# Patient Record
Sex: Male | Born: 1935 | Race: Black or African American | Hispanic: No | Marital: Single | Smoking: Never smoker
Health system: Southern US, Community
[De-identification: ages and names within clinical notes are randomized; demographics above are authoritative.]

## PROBLEM LIST (undated history)

## (undated) DIAGNOSIS — I4891 Unspecified atrial fibrillation: Secondary | ICD-10-CM

## (undated) DIAGNOSIS — F0390 Unspecified dementia without behavioral disturbance: Secondary | ICD-10-CM

## (undated) DIAGNOSIS — R001 Bradycardia, unspecified: Secondary | ICD-10-CM

## (undated) DIAGNOSIS — H409 Unspecified glaucoma: Secondary | ICD-10-CM

## (undated) DIAGNOSIS — N390 Urinary tract infection, site not specified: Secondary | ICD-10-CM

## (undated) DIAGNOSIS — B019 Varicella without complication: Secondary | ICD-10-CM

## (undated) DIAGNOSIS — I951 Orthostatic hypotension: Secondary | ICD-10-CM

## (undated) DIAGNOSIS — H5440 Blindness, one eye, unspecified eye: Secondary | ICD-10-CM

## (undated) DIAGNOSIS — I10 Essential (primary) hypertension: Secondary | ICD-10-CM

## (undated) DIAGNOSIS — I350 Nonrheumatic aortic (valve) stenosis: Secondary | ICD-10-CM

## (undated) DIAGNOSIS — I639 Cerebral infarction, unspecified: Secondary | ICD-10-CM

## (undated) DIAGNOSIS — R55 Syncope and collapse: Secondary | ICD-10-CM

## (undated) DIAGNOSIS — J189 Pneumonia, unspecified organism: Secondary | ICD-10-CM

## (undated) HISTORY — DX: Unspecified glaucoma: H40.9

## (undated) HISTORY — PX: EYE SURGERY: SHX253

## (undated) HISTORY — DX: Urinary tract infection, site not specified: N39.0

## (undated) HISTORY — DX: Orthostatic hypotension: I95.1

## (undated) HISTORY — DX: Varicella without complication: B01.9

---

## 2017-12-04 ENCOUNTER — Other Ambulatory Visit: Payer: Self-pay

## 2017-12-04 ENCOUNTER — Encounter (HOSPITAL_COMMUNITY): Payer: Self-pay | Admitting: Emergency Medicine

## 2017-12-04 ENCOUNTER — Emergency Department (HOSPITAL_COMMUNITY): Payer: Medicare Other

## 2017-12-04 ENCOUNTER — Inpatient Hospital Stay (HOSPITAL_COMMUNITY)
Admission: EM | Admit: 2017-12-04 | Discharge: 2017-12-09 | DRG: 312 | Disposition: A | Discharge status: Skilled Nursing Facility | Payer: Medicare Other | Attending: Family Medicine | Admitting: Family Medicine

## 2017-12-04 DIAGNOSIS — H5461 Unqualified visual loss, right eye, normal vision left eye: Secondary | ICD-10-CM | POA: Diagnosis present

## 2017-12-04 DIAGNOSIS — I959 Hypotension, unspecified: Secondary | ICD-10-CM | POA: Diagnosis present

## 2017-12-04 DIAGNOSIS — Z8249 Family history of ischemic heart disease and other diseases of the circulatory system: Secondary | ICD-10-CM

## 2017-12-04 DIAGNOSIS — J690 Pneumonitis due to inhalation of food and vomit: Secondary | ICD-10-CM | POA: Diagnosis present

## 2017-12-04 DIAGNOSIS — I495 Sick sinus syndrome: Secondary | ICD-10-CM | POA: Diagnosis present

## 2017-12-04 DIAGNOSIS — Z23 Encounter for immunization: Secondary | ICD-10-CM

## 2017-12-04 DIAGNOSIS — D62 Acute posthemorrhagic anemia: Secondary | ICD-10-CM

## 2017-12-04 DIAGNOSIS — R55 Syncope and collapse: Secondary | ICD-10-CM | POA: Diagnosis present

## 2017-12-04 DIAGNOSIS — J181 Lobar pneumonia, unspecified organism: Secondary | ICD-10-CM

## 2017-12-04 DIAGNOSIS — F0390 Unspecified dementia without behavioral disturbance: Secondary | ICD-10-CM | POA: Diagnosis present

## 2017-12-04 DIAGNOSIS — H3411 Central retinal artery occlusion, right eye: Secondary | ICD-10-CM | POA: Diagnosis present

## 2017-12-04 DIAGNOSIS — F329 Major depressive disorder, single episode, unspecified: Secondary | ICD-10-CM | POA: Diagnosis present

## 2017-12-04 DIAGNOSIS — H349 Unspecified retinal vascular occlusion: Secondary | ICD-10-CM

## 2017-12-04 DIAGNOSIS — I4891 Unspecified atrial fibrillation: Secondary | ICD-10-CM | POA: Diagnosis present

## 2017-12-04 DIAGNOSIS — G8194 Hemiplegia, unspecified affecting left nondominant side: Secondary | ICD-10-CM | POA: Diagnosis present

## 2017-12-04 DIAGNOSIS — E785 Hyperlipidemia, unspecified: Secondary | ICD-10-CM | POA: Diagnosis present

## 2017-12-04 DIAGNOSIS — F0391 Unspecified dementia with behavioral disturbance: Secondary | ICD-10-CM | POA: Diagnosis present

## 2017-12-04 DIAGNOSIS — J189 Pneumonia, unspecified organism: Secondary | ICD-10-CM | POA: Diagnosis present

## 2017-12-04 DIAGNOSIS — Z7902 Long term (current) use of antithrombotics/antiplatelets: Secondary | ICD-10-CM

## 2017-12-04 DIAGNOSIS — F05 Delirium due to known physiological condition: Secondary | ICD-10-CM | POA: Diagnosis present

## 2017-12-04 DIAGNOSIS — I10 Essential (primary) hypertension: Secondary | ICD-10-CM | POA: Diagnosis present

## 2017-12-04 DIAGNOSIS — Z79899 Other long term (current) drug therapy: Secondary | ICD-10-CM

## 2017-12-04 DIAGNOSIS — R1310 Dysphagia, unspecified: Secondary | ICD-10-CM | POA: Diagnosis present

## 2017-12-04 DIAGNOSIS — H409 Unspecified glaucoma: Secondary | ICD-10-CM | POA: Diagnosis present

## 2017-12-04 DIAGNOSIS — T17908A Unspecified foreign body in respiratory tract, part unspecified causing other injury, initial encounter: Secondary | ICD-10-CM

## 2017-12-04 DIAGNOSIS — E876 Hypokalemia: Secondary | ICD-10-CM

## 2017-12-04 DIAGNOSIS — I6930 Unspecified sequelae of cerebral infarction: Secondary | ICD-10-CM

## 2017-12-04 DIAGNOSIS — E860 Dehydration: Secondary | ICD-10-CM | POA: Diagnosis present

## 2017-12-04 DIAGNOSIS — I350 Nonrheumatic aortic (valve) stenosis: Secondary | ICD-10-CM

## 2017-12-04 HISTORY — DX: Essential (primary) hypertension: I10

## 2017-12-04 HISTORY — DX: Cerebral infarction, unspecified: I63.9

## 2017-12-04 LAB — CBG MONITORING, ED: Glucose-Capillary: 95 mg/dL (ref 70–99)

## 2017-12-04 LAB — CBC
HEMATOCRIT: 41.5 % (ref 39.0–52.0)
HEMOGLOBIN: 13.1 g/dL (ref 13.0–17.0)
MCH: 28.9 pg (ref 26.0–34.0)
MCHC: 31.6 g/dL (ref 30.0–36.0)
MCV: 91.4 fL (ref 78.0–100.0)
Platelets: 196 10*3/uL (ref 150–400)
RBC: 4.54 MIL/uL (ref 4.22–5.81)
RDW: 14 % (ref 11.5–15.5)
WBC: 8.9 10*3/uL (ref 4.0–10.5)

## 2017-12-04 LAB — I-STAT TROPONIN, ED: TROPONIN I, POC: 0 ng/mL (ref 0.00–0.08)

## 2017-12-04 MED ORDER — SODIUM CHLORIDE 0.9 % IV BOLUS
250.0000 mL | Freq: Once | INTRAVENOUS | Status: AC
  Administered 2017-12-04: 250 mL via INTRAVENOUS

## 2017-12-04 NOTE — ED Provider Notes (Signed)
[facility] EMERGENCY DEPARTMENT Provider Note   CSN: [id number] Arrival date & time: 12/04/17  2126     History   Chief Complaint Chief Complaint  Patient presents with  . Loss of Consciousness    HPI Dan Stone is a 84 y.o. male.  The history is provided by the patient and medical records. No language interpreter was used.  Loss of Consciousness   This is a new problem. The current episode started less than 1 hour ago. The problem occurs constantly. The problem has been resolved. He lost consciousness for a period of 1 to 5 minutes. The problem is associated with normal activity. Associated symptoms include light-headedness, nausea and vomiting. Pertinent negatives include back pain, chest pain, confusion, congestion, diaphoresis, dizziness, fever, headaches, palpitations, seizures, slurred speech, vertigo and weakness. He has tried nothing for the symptoms. The treatment provided no relief. His past medical history is significant for CVA.    Past Medical History:  Diagnosis Date  . Stroke [facility])    5 years ago    There are no active problems to display for this patient.   History reviewed. No pertinent surgical history.      Home Medications    Prior to Admission medications   Not on File    Family History No family history on file.  Social History Social History   Tobacco Use  . Smoking status: Not on file  Substance Use Topics  . Alcohol use: Not on file  . Drug use: Not on file     Allergies   Patient has no allergy information on record.   Review of Systems Review of Systems  Constitutional: Positive for fatigue. Negative for chills, diaphoresis and fever.  HENT: Negative for congestion and rhinorrhea.   Eyes: Negative for visual disturbance.  Respiratory: Negative for cough, choking, chest tightness, shortness of breath and wheezing.   Cardiovascular: Positive for syncope. Negative for chest pain and palpitations.    Gastrointestinal: Positive for nausea and vomiting. Negative for constipation and diarrhea.  Genitourinary: Positive for frequency. Negative for dysuria and flank pain.  Musculoskeletal: Negative for back pain, neck pain and neck stiffness.  Skin: Negative for rash and wound.  Neurological: Positive for light-headedness. Negative for dizziness, vertigo, seizures, speech difficulty, weakness and headaches.  Psychiatric/Behavioral: Negative for agitation and confusion.     Physical Exam Updated Vital Signs BP 107/64   Pulse 87   Temp 98.3 F (36.8 C) (Oral)   Resp 15   Ht 5\' 9"  (1.753 m)   Wt 113.4 kg (250 lb)   SpO2 97%   BMI 36.92 kg/m   Physical Exam  Constitutional: He is oriented to person, place, and time. He appears well-developed and well-nourished. No distress.  HENT:  Head: Normocephalic and atraumatic.  Mouth/Throat: Oropharynx is clear and moist. No oropharyngeal exudate.  Eyes: Conjunctivae and EOM are normal. Right pupil is reactive. Left pupil is reactive.  Right pupil slightly more large than left.  Neck: Neck supple.  Cardiovascular: An irregular rhythm present. Bradycardia present.  No murmur heard. Pulmonary/Chest: Effort normal. No respiratory distress. He has no wheezes. He has rhonchi. He has no rales. He exhibits no tenderness.  Abdominal: Soft. There is no tenderness.  Musculoskeletal: He exhibits no edema or tenderness.  Lymphadenopathy:    He has no cervical adenopathy.  Neurological: He is alert and oriented to person, place, and time. A cranial nerve deficit is present. No sensory deficit. He exhibits normal muscle tone.  Skin: Skin is warm and dry. Capillary refill takes less than 2 seconds. He is not diaphoretic. No erythema. No pallor.  Psychiatric: He has a normal mood and affect.  Nursing note and vitals reviewed.    ED Treatments / Results  Labs (all labs ordered are listed, but only abnormal results are displayed) Labs Reviewed  BASIC  METABOLIC PANEL - Abnormal; Notable for the following components:      Result Value   Glucose, Bld 111 (*)    BUN 7 (*)    Calcium 8.4 (*)    All other components within normal limits  BRAIN NATRIURETIC PEPTIDE - Abnormal; Notable for the following components:   B Natriuretic Peptide 172.2 (*)    All other components within normal limits  HEPATIC FUNCTION PANEL - Abnormal; Notable for the following components:   Total Protein 6.0 (*)    Albumin 2.9 (*)    Total Bilirubin 1.4 (*)    Bilirubin, Direct 0.3 (*)    Indirect Bilirubin 1.1 (*)    All other components within normal limits  URINE CULTURE  CBC  MAGNESIUM  URINALYSIS, ROUTINE W REFLEX MICROSCOPIC  CBG MONITORING, ED  I-STAT TROPONIN, ED  I-STAT TROPONIN, ED    EKG EKG Interpretation  Date/Time:  Wednesday December 04 2017 21:34:52 EDT Ventricular Rate:  80 PR Interval:    QRS Duration: 76 QT Interval:  479 QTC Calculation: 553 R Axis:   87 Text Interpretation:  Sinus or ectopic atrial rhythm Atrial premature complex Anterior infarct, old Nonspecific T abnormalities, lateral leads Prolonged QT interval No Prior ECG for comparison.  Sinus bradycardia.  No STEMI Confirmed by Theda Belfast ([id number]) on 12/04/2017 10:14:46 PM   Radiology Dg Chest 2 View  Result Date: 12/04/2017 CLINICAL DATA:  Vomiting.  Syncopal episode.  Crackles on the lungs. EXAM: CHEST - 2 VIEW COMPARISON:  None. FINDINGS: There is a focal area of airspace infiltration in the right lung base. This could indicate pneumonia or aspiration. Left lung is clear. No blunting of costophrenic angles. No pneumothorax. Heart size and pulmonary vascularity are normal. Calcification of the aorta. Mediastinal contours appear intact. Degenerative changes in the spine and shoulders. IMPRESSION: Focal infiltration in the right lung base may indicate pneumonia or aspiration. Electronically Signed   By: Burman Nieves M.D.   On: 12/04/2017 23:10   Ct Head Wo  Contrast  Result Date: 12/04/2017 CLINICAL DATA:  84 year old male with syncope. EXAM: CT HEAD WITHOUT CONTRAST TECHNIQUE: Contiguous axial images were obtained from the base of the skull through the vertex without intravenous contrast. COMPARISON:  None. FINDINGS: Brain: No evidence of acute infarction, hemorrhage, hydrocephalus, extra-axial collection or mass lesion/mass effect. Atrophy and chronic small-vessel white matter ischemic changes are noted. Vascular: Intracranial atherosclerotic calcifications noted. Skull: Normal. Negative for fracture or focal lesion. Sinuses/Orbits: No acute finding. Other: None. IMPRESSION: 1. No evidence of acute intracranial abnormality. 2. Atrophy and chronic small-vessel white matter ischemic changes. Electronically Signed   By: Harmon Pier M.D.   On: 12/04/2017 23:56    Procedures Procedures (including critical care time)  Medications Ordered in ED Medications  sodium chloride 0.9 % bolus 250 mL (0 mLs Intravenous Stopped 12/05/17 0102)     Initial Impression / Assessment and Plan / ED Course  I have reviewed the triage vital signs and the nursing notes.  Pertinent labs & imaging results that were available during my care of the patient were reviewed by me and considered in my medical decision making (  see chart for details).     Fayrene FearingJames [name] is a 84 y.o. male with a past medical history significant for prior stroke, Alzheimer's, who presents with a syncopal episode.  Patient is coming by family reports that he was at home watching TV when the patient had a witnessed syncopal episode.  Family reports that he syncopized for approximately 5 minutes and vomited upon waking up.  Patient denied any chest pain, palpitations or shortness of breath.  Patient was found to be hypotensive with EMS and had a blood pressure of 78/48.  Patient's blood pressure improved after transfer to the emergency department.  Patient reports that he has been feeling very tired over the  last several days.  He has had some urinary frequency but otherwise has not had any nausea, vomiting, conservation, or diarrhea.  He has had no other chest pain or palpitations.  He denies any recent falls or injuries.   On exam, patient's blood pressure was normotensive however when I was examining the patient, his heart rate was seen on telemetry to drop into the low 40s.  EKG showed an irregular sinus bradycardia.  Patient's lungs had some crackles in the bases.  Patient had a mild systolic murmur.  Patient had no significant edema in the legs and abdomen was nontender.  Patient had no focal neurologic deficits on my exam aside from slightly irregular pupil on the right.  Patient does not remember having asymmetric pupils in the past.  Patient was somewhat sleepy.   I am concerned about the patient's bradycardia on exam and his hypotension with syncope.    ECG showed prolonged QTC as well as a sinus arrythmia.   Patient will screen laboratory testing as well as head CT with the irregular pupil.  He will also have chest x-ray due to the coarse breath sounds.    Chest x-ray shows evidence of pneumonia versus aspiration.  Patient was reassessed and he continued to deny any recent fevers, cough, or shortness of breath.  With the 5-minute syncopal episode and associated emesis I am concerned he may have aspirated.  Due to the patient's aspiration, continued intermittent bradycardia, and the hypotension, do not feel patient is safe for discharge home.  Patient will be admitted for further evaluation and management.   Final Clinical Impressions(s) / ED Diagnoses   Final diagnoses:  Syncope and collapse  Aspiration into respiratory tract, initial encounter    ED Discharge Orders    None      Clinical Impression: 1. Syncope and collapse   2. Aspiration into respiratory tract, initial encounter     Disposition: Admit  This note was prepared with assistance of Dragon voice recognition  software. Occasional wrong-word or sound-a-like substitutions may have occurred due to the inherent limitations of voice recognition software.       [place] Kappes, Canary Brimhristopher J, MD 12/05/17 [phone]

## 2017-12-04 NOTE — ED Triage Notes (Signed)
Patient arrived via ems from home. per ems, patient was taking a nap and when family woke him up he vomited. Fire dept got a palpated bp of 100 lying then sat him up and he had a syncopal episode and a BP of 78/48. bp of 122/70 once he was lying flat and regained consciousness. Patient aox4 at this time. Hx of afib, alzheimer's.

## 2017-12-04 NOTE — ED Notes (Signed)
IV attempted by two different RNs, unsuccessful

## 2017-12-05 ENCOUNTER — Observation Stay (HOSPITAL_BASED_OUTPATIENT_CLINIC_OR_DEPARTMENT_OTHER): Payer: Medicare Other

## 2017-12-05 ENCOUNTER — Observation Stay (HOSPITAL_COMMUNITY): Payer: Medicare Other

## 2017-12-05 ENCOUNTER — Encounter (HOSPITAL_COMMUNITY): Payer: Self-pay | Admitting: Internal Medicine

## 2017-12-05 DIAGNOSIS — H5461 Unqualified visual loss, right eye, normal vision left eye: Secondary | ICD-10-CM | POA: Diagnosis present

## 2017-12-05 DIAGNOSIS — E876 Hypokalemia: Secondary | ICD-10-CM | POA: Diagnosis not present

## 2017-12-05 DIAGNOSIS — I4891 Unspecified atrial fibrillation: Secondary | ICD-10-CM | POA: Diagnosis present

## 2017-12-05 DIAGNOSIS — H409 Unspecified glaucoma: Secondary | ICD-10-CM | POA: Diagnosis present

## 2017-12-05 DIAGNOSIS — I361 Nonrheumatic tricuspid (valve) insufficiency: Secondary | ICD-10-CM

## 2017-12-05 DIAGNOSIS — T17908A Unspecified foreign body in respiratory tract, part unspecified causing other injury, initial encounter: Secondary | ICD-10-CM | POA: Diagnosis not present

## 2017-12-05 DIAGNOSIS — J181 Lobar pneumonia, unspecified organism: Secondary | ICD-10-CM

## 2017-12-05 DIAGNOSIS — Z79899 Other long term (current) drug therapy: Secondary | ICD-10-CM | POA: Diagnosis not present

## 2017-12-05 DIAGNOSIS — Z23 Encounter for immunization: Secondary | ICD-10-CM | POA: Diagnosis present

## 2017-12-05 DIAGNOSIS — I10 Essential (primary) hypertension: Secondary | ICD-10-CM | POA: Diagnosis present

## 2017-12-05 DIAGNOSIS — I639 Cerebral infarction, unspecified: Secondary | ICD-10-CM | POA: Diagnosis not present

## 2017-12-05 DIAGNOSIS — Z8249 Family history of ischemic heart disease and other diseases of the circulatory system: Secondary | ICD-10-CM | POA: Diagnosis not present

## 2017-12-05 DIAGNOSIS — F0390 Unspecified dementia without behavioral disturbance: Secondary | ICD-10-CM | POA: Diagnosis present

## 2017-12-05 DIAGNOSIS — J690 Pneumonitis due to inhalation of food and vomit: Secondary | ICD-10-CM | POA: Diagnosis present

## 2017-12-05 DIAGNOSIS — E785 Hyperlipidemia, unspecified: Secondary | ICD-10-CM | POA: Diagnosis present

## 2017-12-05 DIAGNOSIS — Z7902 Long term (current) use of antithrombotics/antiplatelets: Secondary | ICD-10-CM | POA: Diagnosis not present

## 2017-12-05 DIAGNOSIS — J189 Pneumonia, unspecified organism: Secondary | ICD-10-CM | POA: Diagnosis present

## 2017-12-05 DIAGNOSIS — R55 Syncope and collapse: Secondary | ICD-10-CM | POA: Diagnosis present

## 2017-12-05 DIAGNOSIS — F0391 Unspecified dementia with behavioral disturbance: Secondary | ICD-10-CM | POA: Diagnosis present

## 2017-12-05 DIAGNOSIS — I6930 Unspecified sequelae of cerebral infarction: Secondary | ICD-10-CM | POA: Diagnosis not present

## 2017-12-05 DIAGNOSIS — I495 Sick sinus syndrome: Secondary | ICD-10-CM | POA: Diagnosis present

## 2017-12-05 DIAGNOSIS — I498 Other specified cardiac arrhythmias: Secondary | ICD-10-CM | POA: Diagnosis not present

## 2017-12-05 DIAGNOSIS — D62 Acute posthemorrhagic anemia: Secondary | ICD-10-CM | POA: Diagnosis not present

## 2017-12-05 DIAGNOSIS — G8194 Hemiplegia, unspecified affecting left nondominant side: Secondary | ICD-10-CM | POA: Diagnosis present

## 2017-12-05 DIAGNOSIS — F329 Major depressive disorder, single episode, unspecified: Secondary | ICD-10-CM | POA: Diagnosis present

## 2017-12-05 DIAGNOSIS — I959 Hypotension, unspecified: Secondary | ICD-10-CM | POA: Diagnosis present

## 2017-12-05 DIAGNOSIS — H349 Unspecified retinal vascular occlusion: Secondary | ICD-10-CM | POA: Diagnosis not present

## 2017-12-05 DIAGNOSIS — H3411 Central retinal artery occlusion, right eye: Secondary | ICD-10-CM | POA: Diagnosis present

## 2017-12-05 DIAGNOSIS — E860 Dehydration: Secondary | ICD-10-CM | POA: Diagnosis present

## 2017-12-05 DIAGNOSIS — I350 Nonrheumatic aortic (valve) stenosis: Secondary | ICD-10-CM | POA: Diagnosis present

## 2017-12-05 DIAGNOSIS — F05 Delirium due to known physiological condition: Secondary | ICD-10-CM | POA: Diagnosis present

## 2017-12-05 DIAGNOSIS — R1310 Dysphagia, unspecified: Secondary | ICD-10-CM | POA: Diagnosis present

## 2017-12-05 LAB — TSH: TSH: 1.712 u[IU]/mL (ref 0.350–4.500)

## 2017-12-05 LAB — CBC WITH DIFFERENTIAL/PLATELET
Abs Immature Granulocytes: 0 10*3/uL (ref 0.0–0.1)
Basophils Absolute: 0 10*3/uL (ref 0.0–0.1)
Basophils Relative: 0 %
EOS ABS: 0 10*3/uL (ref 0.0–0.7)
EOS PCT: 0 %
HEMATOCRIT: 39 % (ref 39.0–52.0)
HEMOGLOBIN: 12.4 g/dL — AB (ref 13.0–17.0)
IMMATURE GRANULOCYTES: 0 %
LYMPHS ABS: 0.9 10*3/uL (ref 0.7–4.0)
LYMPHS PCT: 12 %
MCH: 28.6 pg (ref 26.0–34.0)
MCHC: 31.8 g/dL (ref 30.0–36.0)
MCV: 90.1 fL (ref 78.0–100.0)
MONOS PCT: 6 %
Monocytes Absolute: 0.5 10*3/uL (ref 0.1–1.0)
NEUTROS PCT: 82 %
Neutro Abs: 6.5 10*3/uL (ref 1.7–7.7)
Platelets: 177 10*3/uL (ref 150–400)
RBC: 4.33 MIL/uL (ref 4.22–5.81)
RDW: 14.1 % (ref 11.5–15.5)
WBC: 7.9 10*3/uL (ref 4.0–10.5)

## 2017-12-05 LAB — URINALYSIS, ROUTINE W REFLEX MICROSCOPIC
BILIRUBIN URINE: NEGATIVE
Glucose, UA: NEGATIVE mg/dL
HGB URINE DIPSTICK: NEGATIVE
Ketones, ur: NEGATIVE mg/dL
Leukocytes, UA: NEGATIVE
NITRITE: NEGATIVE
PH: 6 (ref 5.0–8.0)
Protein, ur: NEGATIVE mg/dL
SPECIFIC GRAVITY, URINE: 1.018 (ref 1.005–1.030)

## 2017-12-05 LAB — HEPATIC FUNCTION PANEL
ALT: 13 U/L (ref 0–44)
AST: 19 U/L (ref 15–41)
Albumin: 2.9 g/dL — ABNORMAL LOW (ref 3.5–5.0)
Alkaline Phosphatase: 78 U/L (ref 38–126)
BILIRUBIN DIRECT: 0.3 mg/dL — AB (ref 0.0–0.2)
BILIRUBIN INDIRECT: 1.1 mg/dL — AB (ref 0.3–0.9)
Total Bilirubin: 1.4 mg/dL — ABNORMAL HIGH (ref 0.3–1.2)
Total Protein: 6 g/dL — ABNORMAL LOW (ref 6.5–8.1)

## 2017-12-05 LAB — I-STAT TROPONIN, ED: Troponin i, poc: 0.01 ng/mL (ref 0.00–0.08)

## 2017-12-05 LAB — TROPONIN I: Troponin I: <0.03 ng/mL (ref <0.03)

## 2017-12-05 LAB — COMPREHENSIVE METABOLIC PANEL
ALT: 13 U/L (ref 0–44)
AST: 20 U/L (ref 15–41)
Albumin: 2.8 g/dL — ABNORMAL LOW (ref 3.5–5.0)
Alkaline Phosphatase: 75 U/L (ref 38–126)
Anion gap: 10 (ref 5–15)
BUN: 9 mg/dL (ref 8–23)
CO2: 26 mmol/L (ref 22–32)
CREATININE: 1.06 mg/dL (ref 0.61–1.24)
Calcium: 8.5 mg/dL — ABNORMAL LOW (ref 8.9–10.3)
Chloride: 106 mmol/L (ref 98–111)
GFR calc Af Amer: >60 mL/min (ref >60)
GFR calc non Af Amer: >60 mL/min (ref >60)
Glucose, Bld: 92 mg/dL (ref 70–99)
POTASSIUM: 3.8 mmol/L (ref 3.5–5.1)
SODIUM: 142 mmol/L (ref 135–145)
Total Bilirubin: 1.6 mg/dL — ABNORMAL HIGH (ref 0.3–1.2)
Total Protein: 5.9 g/dL — ABNORMAL LOW (ref 6.5–8.1)

## 2017-12-05 LAB — BASIC METABOLIC PANEL
Anion gap: 7 (ref 5–15)
BUN: 7 mg/dL — ABNORMAL LOW (ref 8–23)
CO2: 25 mmol/L (ref 22–32)
CREATININE: 1.04 mg/dL (ref 0.61–1.24)
Calcium: 8.4 mg/dL — ABNORMAL LOW (ref 8.9–10.3)
Chloride: 107 mmol/L (ref 98–111)
GFR calc non Af Amer: >60 mL/min (ref >60)
Glucose, Bld: 111 mg/dL — ABNORMAL HIGH (ref 70–99)
Potassium: 3.8 mmol/L (ref 3.5–5.1)
SODIUM: 139 mmol/L (ref 135–145)

## 2017-12-05 LAB — ECHOCARDIOGRAM COMPLETE
HEIGHTINCHES: 69 in
WEIGHTICAEL: 4000 [oz_av]

## 2017-12-05 LAB — STREP PNEUMONIAE URINARY ANTIGEN: STREP PNEUMO URINARY ANTIGEN: NEGATIVE

## 2017-12-05 LAB — MAGNESIUM: Magnesium: 2.1 mg/dL (ref 1.7–2.4)

## 2017-12-05 LAB — BRAIN NATRIURETIC PEPTIDE: B NATRIURETIC PEPTIDE 5: 172.2 pg/mL — AB (ref 0.0–100.0)

## 2017-12-05 MED ORDER — SERTRALINE HCL 25 MG PO TABS
25.0000 mg | ORAL_TABLET | Freq: Every day | ORAL | Status: DC
  Administered 2017-12-05 – 2017-12-07 (×3): 25 mg via ORAL
  Filled 2017-12-05 (×4): qty 1

## 2017-12-05 MED ORDER — ACETAMINOPHEN 650 MG RE SUPP: 650.0000 mg | Freq: Four times a day (QID) | RECTAL | Status: DC | PRN

## 2017-12-05 MED ORDER — ACETAMINOPHEN 325 MG PO TABS: 650.0000 mg | ORAL_TABLET | Freq: Four times a day (QID) | ORAL | Status: DC | PRN

## 2017-12-05 MED ORDER — PNEUMOCOCCAL VAC POLYVALENT 25 MCG/0.5ML IJ INJ
0.5000 mL | INJECTION | INTRAMUSCULAR | Status: AC
  Administered 2017-12-06: 0.5 mL via INTRAMUSCULAR
  Filled 2017-12-05: qty 0.5

## 2017-12-05 MED ORDER — GABAPENTIN 100 MG PO CAPS
100.0000 mg | ORAL_CAPSULE | Freq: Every day | ORAL | Status: DC
  Administered 2017-12-05 – 2017-12-09 (×5): 100 mg via ORAL
  Filled 2017-12-05 (×5): qty 1

## 2017-12-05 MED ORDER — SODIUM CHLORIDE 0.9 % IV SOLN
1.5000 g | Freq: Four times a day (QID) | INTRAVENOUS | Status: DC
  Administered 2017-12-05 – 2017-12-09 (×16): 1.5 g via INTRAVENOUS
  Filled 2017-12-05 (×19): qty 1.5

## 2017-12-05 MED ORDER — TRAZODONE HCL 50 MG PO TABS
50.0000 mg | ORAL_TABLET | Freq: Every day | ORAL | Status: DC
  Administered 2017-12-05 – 2017-12-07 (×3): 50 mg via ORAL
  Filled 2017-12-05 (×4): qty 1

## 2017-12-05 MED ORDER — CLOPIDOGREL BISULFATE 75 MG PO TABS
75.0000 mg | ORAL_TABLET | Freq: Every day | ORAL | Status: DC
  Administered 2017-12-05 – 2017-12-09 (×5): 75 mg via ORAL
  Filled 2017-12-05 (×5): qty 1

## 2017-12-05 MED ORDER — ENOXAPARIN SODIUM 40 MG/0.4ML ~~LOC~~ SOLN
40.0000 mg | SUBCUTANEOUS | Status: DC
  Administered 2017-12-05: 40 mg via SUBCUTANEOUS
  Filled 2017-12-05: qty 0.4

## 2017-12-05 MED ORDER — LEVOFLOXACIN IN D5W 750 MG/150ML IV SOLN
750.0000 mg | INTRAVENOUS | Status: DC
  Administered 2017-12-05: 750 mg via INTRAVENOUS
  Filled 2017-12-05: qty 150

## 2017-12-05 MED ORDER — PERFLUTREN LIPID MICROSPHERE
1.0000 mL | INTRAVENOUS | Status: AC | PRN
  Administered 2017-12-05: 2 mL via INTRAVENOUS
  Filled 2017-12-05: qty 10

## 2017-12-05 MED ORDER — AMLODIPINE BESYLATE 5 MG PO TABS: 5.0000 mg | ORAL_TABLET | Freq: Every day | ORAL | Status: DC

## 2017-12-05 MED ORDER — HYDRALAZINE HCL 20 MG/ML IJ SOLN: 5.0000 mg | INTRAMUSCULAR | Status: DC | PRN

## 2017-12-05 MED ORDER — ONDANSETRON HCL 4 MG/2ML IJ SOLN: 4.0000 mg | Freq: Four times a day (QID) | INTRAMUSCULAR | Status: DC | PRN

## 2017-12-05 MED ORDER — RISPERIDONE 1 MG PO TABS
1.5000 mg | ORAL_TABLET | Freq: Two times a day (BID) | ORAL | Status: DC
  Administered 2017-12-05 – 2017-12-07 (×5): 1.5 mg via ORAL
  Filled 2017-12-05 (×5): qty 1

## 2017-12-05 MED ORDER — ONDANSETRON HCL 4 MG PO TABS: 4.0000 mg | ORAL_TABLET | Freq: Four times a day (QID) | ORAL | Status: DC | PRN

## 2017-12-05 NOTE — Progress Notes (Signed)
SLP Cancellation Note  Patient Details Name: Eilene GhaziJames Welcome MRN: [id number] DOB: 08-12-1933   Cancelled treatment:       Reason Eval/Treat Not Completed: Patient at procedure or test/unavailable   Maxcine Hamaiewonsky, Celie Desrochers 12/05/2017, 9:23 AM   Maxcine HamLaura Paiewonsky, M.A. CCC-SLP [phone]

## 2017-12-05 NOTE — ED Notes (Signed)
ED Provider at bedside. 

## 2017-12-05 NOTE — Evaluation (Signed)
Clinical/Bedside Swallow Evaluation Patient Details  Name: Dan Stone MRN: [id number] Date of Birth: 03-23-1934  Today's Date: 12/05/2017 Time: SLP Start Time (ACUTE ONLY): 1012 SLP Stop Time (ACUTE ONLY): 1031 SLP Time Calculation (min) (ACUTE ONLY): 19 min  Past Medical History:  Past Medical History:  Diagnosis Date  . Hypertension   . Stroke [facility])    5 years ago   Past Surgical History:  Past Surgical History:  Procedure Laterality Date  . EYE SURGERY     HPI:  Pt is an 84 y.o. male with history of hypertension, stroke, dementia who was brought to the ER after patient had a syncopal episode witnessed by patient's daughter and associated with light-headedness, nausea, and vomiting. Pt has also had a 3 day h/o poor vision in his R eye. MRI negative for acute findings. CXR concerning for possible aspiration, therefore swallow evaluation was ordered.   Assessment / Plan / Recommendation Clinical Impression  Although pt has no overt signs of aspiration, he does have mild oral residue with regular solids. Liquid wash did not fully clear his oral cavity, requiring additional Min cues from SLP for lingual sweep. Recommend continuing with current diet (regular textures, thin liquids) but with monitoring for oral clearance. Given mild oral dysphagia, cognitive impairment, and concern for PNA/aspiration on CXR, will f/u briefly for tolerance. SLP Visit Diagnosis: Dysphagia, oral phase (R13.11)    Aspiration Risk  Mild aspiration risk    Diet Recommendation Regular;Thin liquid   Liquid Administration via: Cup;Straw Medication Administration: Whole meds with liquid Supervision: Intermittent supervision to cue for compensatory strategies;Comment;Patient able to self feed(may need help due to visual deficits) Compensations: Minimize environmental distractions;Slow rate;Small sips/bites;Lingual sweep for clearance of pocketing Postural Changes: Seated upright at 90 degrees;Remain upright  for at least 30 minutes after po intake    Other  Recommendations Oral Care Recommendations: Oral care BID   Follow up Recommendations 24 hour supervision/assistance      Frequency and Duration min 2x/week  1 week       Prognosis        Swallow Study   General HPI: Pt is an 84 y.o. male with history of hypertension, stroke, dementia who was brought to the ER after patient had a syncopal episode witnessed by patient's daughter and associated with light-headedness, nausea, and vomiting. Pt has also had a 3 day h/o poor vision in his R eye. MRI negative for acute findings. CXR concerning for possible aspiration, therefore swallow evaluation was ordered. Type of Study: Bedside Swallow Evaluation Previous Swallow Assessment: none in chart Diet Prior to this Study: Regular;Thin liquids Temperature Spikes Noted: No Respiratory Status: Room air History of Recent Intubation: No Behavior/Cognition: Alert;Cooperative;Pleasant mood;Requires cueing Oral Cavity Assessment: Within Functional Limits Oral Care Completed by SLP: No Oral Cavity - Dentition: Missing dentition Vision: Impaired for self-feeding(reduced vision out of R eye) Self-Feeding Abilities: Able to feed self;Needs assist Patient Positioning: Upright in bed Baseline Vocal Quality: Normal Volitional Cough: Weak Volitional Swallow: Able to elicit    Oral/Motor/Sensory Function Overall Oral Motor/Sensory Function: Within functional limits   Ice Chips Ice chips: Not tested   Thin Liquid Thin Liquid: Within functional limits Presentation: Cup;Self Fed;Straw    Nectar Thick Nectar Thick Liquid: Not tested   Honey Thick Honey Thick Liquid: Not tested   Puree Puree: Within functional limits Presentation: Self Fed;Spoon   Solid   GO   Solid: Impaired Presentation: Self Fed Oral Phase Functional Implications: Oral residue  Maxcine Hamaiewonsky, Zakira Ressel 12/05/2017,10:57 AM  Maxcine HamLaura Paiewonsky, M.A. CCC-SLP [phone]

## 2017-12-05 NOTE — Consult Note (Signed)
Cardiology Consult    Patient ID: Dan Stone MRN: [id number], DOB/AGE: [birth year]   Admit date: 12/04/2017 Date of Consult: 12/05/2017  Primary Physician: Patient, No Pcp Per Primary Cardiologist: New Requesting Provider: Dr. Maryfrances Bunnell Reason for Consultation: Syncope  Dan Stone is a 84 y.o. male who is being seen today for the evaluation of syncope at the request of Dr. Maryfrances Bunnell.   Patient Profile    84 yo male with PMH of HTN, stroke, dementia who presented to the ED after a witnessed syncopal episode.   Past Medical History   Past Medical History:  Diagnosis Date  . Hypertension   . Stroke [facility])    5 years ago    Past Surgical History:  Procedure Laterality Date  . EYE SURGERY       Allergies  No Known Allergies  History of Present Illness    Dan Stone is an 84 yo male with PMH of HTN, stroke, dementia. Reported to have recently moved from [place] to [place] and was in rehab for lower back pain. Records unavailable in care everywhere at this time. Reported to be in his usual state of health until yesterday evening. Daughter he was being fed dinner by the patient's grandson and had a slow loss of consciousness. Reported this episode probably lasted about less than a minute and the patient was then arousable. No seizure like activity noted, no loss of of bowel or bladder. EMS was called and the patient brought to the ED. Denies any chest pain, shortness or breath, palpitation, lightheadedness or n/v recently. Of note daughter did state that he had been complaining of poor vision in the right eye over the past 3 days.   In the ED he was found to be hypotensive with episodes of bradycardia, HR around 40. He was given IVF bolus with improvement in his blood pressure. 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 Labs showed stable electrolytes, Cr 1.04, BNP 172, Hgb 13.1, trop neg x4. CT head was negative, MRI with moderate chronic small  vessel ischemic changes, and old infarction of the right side of the pons. Review of telemetry and follow up EKG this morning notes p waves but dropped QRS at times. Sinus arrhythmia? Vs 2nd degree HB. Blood pressure with improvement today.   Inpatient Medications    . clopidogrel  75 mg Oral Daily  . enoxaparin (LOVENOX) injection  40 mg Subcutaneous Q24H  . gabapentin  100 mg Oral Daily  . [START ON 12/06/2017] pneumococcal 23 valent vaccine  0.5 mL Intramuscular Tomorrow-1000  . risperiDONE  1.5 mg Oral BID  . sertraline  25 mg Oral QHS  . traZODone  50 mg Oral QHS    Family History    Family History  Problem Relation Age of Onset  . CAD Father     Social History    Social History   Socioeconomic History  . Marital status: Single    Spouse name: Not on file  . Number of children: Not on file  . Years of education: Not on file  . Highest education level: Not on file  Occupational History  . Not on file  Social Needs  . Financial resource strain: Not on file  . Food insecurity:    Worry: Not on file    Inability: Not on file  . Transportation needs:    Medical: Not on file    Non-medical: Not on file  Tobacco Use  . Smoking  status: Never Smoker  . Smokeless tobacco: Never Used  Substance and Sexual Activity  . Alcohol use: Never    Frequency: Never  . Drug use: Never  . Sexual activity: Never  Lifestyle  . Physical activity:    Days per week: Not on file    Minutes per session: Not on file  . Stress: Not on file  Relationships  . Social connections:    Talks on phone: Not on file    Gets together: Not on file    Attends religious service: Not on file    Active member of club or organization: Not on file    Attends meetings of clubs or organizations: Not on file    Relationship status: Not on file  . Intimate partner violence:    Fear of current or ex partner: Not on file    Emotionally abused: Not on file    Physically abused: Not on file    Forced  sexual activity: Not on file  Other Topics Concern  . Not on file  Social History Narrative  . Not on file     Review of Systems    Patient off floor  Physical Exam    Blood pressure (!) 129/104, pulse 70, temperature 98.6 F (37 C), temperature source Oral, resp. rate 18, height 5\' 9"  (1.753 m), weight 250 lb (113.4 kg), SpO2 97 %.  Patient off floor  Labs    Troponin [facility]) Recent Labs    12/05/17 0102  TROPIPOC 0.01   Recent Labs    12/05/17 0454 12/05/17 1034  TROPONINI <0.03 <0.03   Lab Results  Component Value Date   WBC 7.9 12/05/2017   HGB 12.4 (L) 12/05/2017   HCT 39.0 12/05/2017   MCV 90.1 12/05/2017   PLT 177 12/05/2017    Recent Labs  Lab 12/05/17 0454  NA 142  K 3.8  CL 106  CO2 26  BUN 9  CREATININE 1.06  CALCIUM 8.5*  PROT 5.9*  BILITOT 1.6*  ALKPHOS 75  ALT 13  AST 20  GLUCOSE 92   No results found for: CHOL, HDL, LDLCALC, TRIG No results found for: [facility]   Radiology Studies    Dg Chest 2 View  Result Date: 12/04/2017 CLINICAL DATA:  Vomiting.  Syncopal episode.  Crackles on the lungs. EXAM: CHEST - 2 VIEW COMPARISON:  None. FINDINGS: There is a focal area of airspace infiltration in the right lung base. This could indicate pneumonia or aspiration. Left lung is clear. No blunting of costophrenic angles. No pneumothorax. Heart size and pulmonary vascularity are normal. Calcification of the aorta. Mediastinal contours appear intact. Degenerative changes in the spine and shoulders. IMPRESSION: Focal infiltration in the right lung base may indicate pneumonia or aspiration. Electronically Signed   By: Burman Nieves M.D.   On: 12/04/2017 23:10   Ct Head Wo Contrast  Result Date: 12/04/2017 CLINICAL DATA:  84 year old male with syncope. EXAM: CT HEAD WITHOUT CONTRAST TECHNIQUE: Contiguous axial images were obtained from the base of the skull through the vertex without intravenous contrast. COMPARISON:  None. FINDINGS: Brain:  No evidence of acute infarction, hemorrhage, hydrocephalus, extra-axial collection or mass lesion/mass effect. Atrophy and chronic small-vessel white matter ischemic changes are noted. Vascular: Intracranial atherosclerotic calcifications noted. Skull: Normal. Negative for fracture or focal lesion. Sinuses/Orbits: No acute finding. Other: None. IMPRESSION: 1. No evidence of acute intracranial abnormality. 2. Atrophy and chronic small-vessel white matter ischemic changes. Electronically Signed   By: Tinnie Gens  Hu M.D.   On: 12/04/2017 23:56   Mr Brain Wo Contrast  Result Date: 12/05/2017 CLINICAL DATA:  Focal neurological deficit. Syncopal episode yesterday. EXAM: MRI HEAD WITHOUT CONTRAST TECHNIQUE: Multiplanar, multiecho pulse sequences of the brain and surrounding structures were obtained without intravenous contrast. COMPARISON:  CT 12/04/2017. FINDINGS: Brain: Diffusion imaging does not show any acute or subacute infarction. There is old infarction affecting the right side of the pons. There are a few old small vessel cerebellar infarctions. Cerebral hemispheres show moderate changes of chronic small vessel disease affecting the deep and subcortical white matter. No large vessel territory infarction. No mass lesion, hemorrhage, hydrocephalus or extra-axial collection. Vascular: Major vessels at the base of the brain show flow. Single exception to this is apparent absent flow in the right vertebral artery. Skull and upper cervical spine: Negative Sinuses/Orbits: Clear/normal Other: None IMPRESSION: No acute finding. Old infarction affecting the right side of the pons. Moderate chronic small-vessel ischemic changes of the cerebellum and cerebral hemispheric white matter. Absent flow in the right vertebral artery. Electronically Signed   By: Paulina FusiMark  Shogry M.D.   On: 12/05/2017 09:33      Assessment & Plan     Patient off floor at 11 AM, came back 4 hrs later at 3pm and still off floor. I discussed patient  in detail with his family member who lives with him. I have not billed today as a consultation.      84 yo male history of HTN, CVA, dementia admitted with witnessed syncope. From family report patient passed out during dinner The daughter reports he was mildly lethargic throughout the day. At dinner while eating applesauce, began choking severaly and vomiting. After this episode he became unresponsive. By notes by EMS evaluation was found to be bradycardic to 40s, hypotensive with SBP in the 80s. Bp's improved with IVFs.   In ER episodes of brady to 40s, low bp's. BP improved with IVFs  K 3.8 Cr 1.04 BUN 7 WBC 8.9 Plt 196 Mg 2.1 BNP 172 TSH 1.7  Trop neg x 3 CXR focal infiltrate RLL CT head no acute process MRI brain no acute process. Old infarct. Moderate chronic small vessel disease Echo pending EKG SR, second degree mobitz I block, intermittent junctional escape beats. 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     EKGs look like wandering atrial pacemaker. I think his native conduction is slow to 30-40s at times, allowing escape atrial rhythms and junctional beats at times. He is on timolol, namenda at home.  I suspect he has chronic conduction disease, and with the aspiration event had increased vagal tone driving his heart rates down and bp's. Unclear if his underlying conduction disease and this episode is enough to warrant pacemaker. Family reports despite dementia is able to get around on his walker at home. Able to hold conversations. Does need some assistance with dressing, eating.   Will need EP evaluation to consider pacemaker, unclear given his baseline functinal capacity his candidacy. Also likely would need to have completed abx treatement for pneumonia, certaintly cultures would need to be negative (still pending at this time). F/u echo.    Dina RichJonathan Avry Monteleone MD

## 2017-12-05 NOTE — H&P (Addendum)
History and Physical    Dan Stone:[id number] DOB: 12-Oct-1933 DOA: 12/04/2017  PCP: Dan Stone, No Pcp Per  Dan Stone coming from: Home.  History obtained from Dan Stone's Stone.  Chief Complaint: Loss of consciousness.  HPI: Dan Stone is a 84 y.o. male with history of hypertension, stroke, dementia was brought to the ER after Dan Stone had a syncopal episode witnessed by Dan Stone's Stone.  Dan Stone states last evening at home while Dan Stone was being given supper by Dan Stone's grandson Dan Stone slowly lost consciousness.  No loss of consciousness may have been for less than half a minute.  Did not have any seizure-like activities.  EMS was called and Dan Stone was brought to the ER.  Dan Stone has not complained of any chest pain shortness of breath nausea vomiting or abdominal pain no new changes in medications.  Dan Stone's Stone states that over the last 3 days Dan Stone has been complaining of poor vision in the right eye.  Dan Stone just recently moved from [place] to [place].  Was recently in rehab for low back pain.  ED Course: In the ER Dan Stone was found to be initially hypotensive with bradycardic episodes heart rate going into the 40s.  Was given fluid bolus 250 cc following which blood pressure improved.  Heart rate is sustaining at 60/min at this time.  EKG was showing sinus rhythm with possible ectopic rhythm and prolonged QTC at 553 ms.  Dan Stone admitted for further management of syncope.  Review of Systems: As per HPI, rest all negative.   Past Medical History:  Diagnosis Date  . Hypertension   . Stroke [facility])    5 years ago    Past Surgical History:  Procedure Laterality Date  . EYE SURGERY       reports that he has never smoked. He has never used smokeless tobacco. He reports that he does not drink alcohol or use drugs.  No Known Allergies  Family History  Problem Relation Age of Onset  . CAD Father     Prior to Admission medications     Medication Sig Start Date End Date Taking? Authorizing Provider  amLODipine (NORVASC) 10 MG tablet Take 5 mg by mouth at bedtime.    Yes [provider]  clopidogrel (PLAVIX) 75 MG tablet Take 75 mg by mouth daily.   Yes [provider]  gabapentin (NEURONTIN) 100 MG capsule Take 100 mg by mouth daily.   Yes [provider]  memantine (NAMENDA) 10 MG tablet Take 5 mg by mouth 2 (two) times daily.   Yes [provider]  risperiDONE (RISPERDAL) 1 MG tablet Take 1.5 mg by mouth 2 (two) times daily.    Yes [provider]  sertraline (ZOLOFT) 25 MG tablet Take 25 mg by mouth at bedtime.   Yes [provider]  traZODone (DESYREL) 50 MG tablet Take 50 mg by mouth at bedtime.   Yes [provider]    Physical Exam: Vitals:   12/05/17 0115 12/05/17 0130 12/05/17 0145 12/05/17 0303  BP: (!) 105/48 116/75 115/62 125/77  Pulse: 89 (!) 104 79 (!) 55  Resp: 17  (!) 21 18  Temp:    98.6 F (37 C)  TempSrc:    Oral  SpO2: 98% 95% 99% 99%  Weight:      Height:          Constitutional: Moderately built and nourished. Vitals:   12/05/17 0115 12/05/17 0130 12/05/17 0145 12/05/17 0303  BP: (!) 105/48 116/75 115/62 125/77  Pulse:  89 (!) 104 79 (!) 55  Resp: 17  (!) 21 18  Temp:    98.6 F (37 C)  TempSrc:    Oral  SpO2: 98% 95% 99% 99%  Weight:      Height:       Eyes: Poor vision in the right eye. ENMT: No discharge from the ears eyes nose or mouth. Neck: No mass felt.  No neck rigidity.  No JVD appreciated. Respiratory: No rhonchi or crepitations. Cardiovascular: S1-S2 heard systolic murmur. Abdomen: Soft nontender bowel sounds present. Musculoskeletal: No edema.  No joint effusion. Skin: No rash.  Skin appears warm. Neurologic: Alert awake oriented to place and person.  Poor vision in the right eye.  Pupils are equal and reacting to light.  Moves all extremities 5 x 5. Psychiatric oriented to place and person.   Labs on  Admission: I have personally reviewed following labs and imaging studies  CBC: Recent Labs  Lab 12/04/17 2321  WBC 8.9  HGB 13.1  HCT 41.5  MCV 91.4  PLT 196   Basic Metabolic Panel: Recent Labs  Lab 12/04/17 2321  NA 139  K 3.8  CL 107  CO2 25  GLUCOSE 111*  BUN 7*  CREATININE 1.04  CALCIUM 8.4*  MG 2.1   GFR: Estimated Creatinine Clearance: 69.2 mL/min (by C-G formula based on SCr of 1.04 mg/dL). Liver Function Tests: Recent Labs  Lab 12/04/17 2321  AST 19  ALT 13  ALKPHOS 78  BILITOT 1.4*  PROT 6.0*  ALBUMIN 2.9*   No results for input(s): LIPASE, AMYLASE in the last 168 hours. No results for input(s): AMMONIA in the last 168 hours. Coagulation Profile: No results for input(s): INR, PROTIME in the last 168 hours. Cardiac Enzymes: No results for input(s): CKTOTAL, CKMB, CKMBINDEX, TROPONINI in the last 168 hours. BNP (last 3 results) No results for input(s): PROBNP in the last 8760 hours. HbA1C: No results for input(s): HGBA1C in the last 72 hours. CBG: Recent Labs  Lab 12/04/17 2144  GLUCAP 95   Lipid Profile: No results for input(s): CHOL, HDL, LDLCALC, TRIG, CHOLHDL, LDLDIRECT in the last 72 hours. Thyroid Function Tests: No results for input(s): TSH, T4TOTAL, FREET4, T3FREE, THYROIDAB in the last 72 hours. Anemia Panel: No results for input(s): VITAMINB12, FOLATE, FERRITIN, TIBC, IRON, RETICCTPCT in the last 72 hours. Urine analysis: No results found for: COLORURINE, APPEARANCEUR, LABSPEC, PHURINE, GLUCOSEU, HGBUR, BILIRUBINUR, KETONESUR, PROTEINUR, UROBILINOGEN, NITRITE, LEUKOCYTESUR Sepsis Labs: @LABRCNTIP (procalcitonin:4,lacticidven:4) )No results found for this or any previous visit (from the past 240 hour(s)).   Radiological Exams on Admission: Dg Chest 2 View  Result Date: 12/04/2017 CLINICAL DATA:  Vomiting.  Syncopal episode.  Crackles on the lungs. EXAM: CHEST - 2 VIEW COMPARISON:  None. FINDINGS: There is a focal area of airspace  infiltration in the right lung base. This could indicate pneumonia or aspiration. Left lung is clear. No blunting of costophrenic angles. No pneumothorax. Heart size and pulmonary vascularity are normal. Calcification of the aorta. Mediastinal contours appear intact. Degenerative changes in the spine and shoulders. IMPRESSION: Focal infiltration in the right lung base may indicate pneumonia or aspiration. Electronically Signed   By: Burman Nieves M.D.   On: 12/04/2017 23:10   Ct Head Wo Contrast  Result Date: 12/04/2017 CLINICAL DATA:  84 year old male with syncope. EXAM: CT HEAD WITHOUT CONTRAST TECHNIQUE: Contiguous axial images were obtained from the base of the skull through the vertex without intravenous contrast. COMPARISON:  None. FINDINGS: Brain: No evidence of  acute infarction, hemorrhage, hydrocephalus, extra-axial collection or mass lesion/mass effect. Atrophy and chronic small-vessel white matter ischemic changes are noted. Vascular: Intracranial atherosclerotic calcifications noted. Skull: Normal. Negative for fracture or focal lesion. Sinuses/Orbits: No acute finding. Other: None. IMPRESSION: 1. No evidence of acute intracranial abnormality. 2. Atrophy and chronic small-vessel white matter ischemic changes. Electronically Signed   By: Harmon PierJeffrey  Hu M.D.   On: 12/04/2017 23:56    EKG: Independently reviewed.  Normal sinus rhythm with possible ectopic rhythm.  Assessment/Plan Principal Problem:   Syncope and collapse Active Problems:   Essential hypertension   Aspiration into respiratory tract   Dementia   Syncope    1. Syncope -Dan Stone was mildly hypotensive with bradycardic episodes.  Closely monitor in telemetry for any further bradycardic episodes.  Will hold antihypertensives for now until Dan Stone blood pressure is persistently elevated.  Continue with gentle hydration.  Dan Stone has a systolic murmur will check 2D echo.  Due to bradycardic episodes I am holding off Namenda.  Check  TSH.  QTC is prolonged for which we will closely follow metabolic panel and magnesium levels. 2. Right eye poor vision -per the Stone this has been ongoing for last 3 days and appears to be new.  Dan Stone has history of previous stroke and is on Plavix.  Will check MRI brain.  If MRI brain is negative may have to get ophthalmology opinion. 3. Possible aspiration of the right side per chest x-ray.  Dan Stone denies any cough or phlegm.  I have placed Dan Stone on empiric antibiotics for now and get swallow evaluation. 4. History of dementia -due to bradycardic episodes holding off Namenda. 5. Hypertension -holding amlodipine due to initial low normal blood pressure.  PRN IV hydralazine. 6. Previous stroke on Plavix. 7. History of depression on Zoloft and Risperdal.   DVT prophylaxis: Lovenox. Code Status: Full code. Family Communication: Dan Stone's Stone. Disposition Plan: Home. Consults called: Physical therapy. Admission status: Observation.   Eduard ClosArshad N Navneet Schmuck MD [facility] Pager [phone].  If 7PM-7AM, please contact night-coverage [email or web address] Password [email or web address]  12/05/2017, 4:28 AM

## 2017-12-05 NOTE — Progress Notes (Addendum)
PROGRESS NOTE    Dan Stone  RWE:[id number] DOB: [birth year] DOA: 12/04/2017 PCP: Patient, No Pcp Per      Brief Narrative:  Dan Stone is a 84 y.o. 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  Patient unable to provide history, but daughter states he had been "kind of sluggish all day", and in the afternoon/evening was with grandson, seated, when he slumped and passed out.  Was unresponsive for some minutes, then woke and vomited.  EMS were called, who found the patient still very sluggish, less responsive, afebrile but HR 40s and BP 80/40, gave fluids and brought to the ER.  By time of arrival to ER BP normalized and patient more alert/near baseline, but was noted in the ER to have intermittent sinus arrhythmia and bradycardia to the 30s and 40s.  Of note, also CXR showing RIGHT base pneumonia.  Also, new onset complete visual loss in right eye.      Assessment & Plan:  Syncope Bradycardia Telemetry here showing persistent bradycardia into the 40s, although this is asymmetric.  K and mag normal.  TSH normal.  BNP normal.  Troponin negative. -Obtain echo -Monitor on tele -Consult to Cardiology, appreciate cares    Painless monocular vision loss MRI negative. Suspect CRAO, no floaters to suggest detachment, no systemic symptoms to suggest GCA.  Per patient, present for several days.  Per daughter, unknown how long present, but at this point, at least 24 hours.  Discussed with ophthalmology on call, Dr. Manuella Ghazi.  No therapeutic options this far out.  Will best be served with office eval after discharge to confirm, if still here tomorrow, Dr. Manuella Ghazi will see. -Check ESR/CRP -Consult ophthalmology -Stroke work up with Echo, carotids, telemetry, lipids, HgbA1c  Community acquired pneumonia of right lower lobe Right lower lobar pneumonia,  possibly aspiration related, doubt from vomiting event today (given lethargy preceding syncope) -Stop Levaquin -Start Unasyn  Hypertension Secondary prevention of CVA -Hold amlodipine for now given soft BP in ER, bradycardia -Continue Plavix  Other medications -Continue gabapentin  Dementia with behavioral disturbance -Hold Namenda -Continue Risperdal, sertraline, trazodone  Glaucoma -May continue eye drops      DVT prophylaxis: Lovenox Code Status: FULL Family Communication: Daughter at bedside MDM and disposition Plan: This is a no charge note.  For further details, please see H&P by my partner Dr. Hal Stone from earlier today.  The below labs and imaging reports were reviewed and summarized above.    The patient was admitted with syncope, irregular bradycardia.  Cardiology consulted.  Echo pending.  Also, incidetnally, CRAO suspected, embolic work up pending.    Lastly, pneumonia diagnosed incidentally.  Pt has PSI class IV, >8% mortality, warrants inpatient management with Iv antibiotics.    Objective: Vitals:   12/05/17 0130 12/05/17 0145 12/05/17 0303 12/05/17 0614  BP: 116/75 115/62 125/77 (!) 129/104  Pulse: (!) 104 79 (!) 55 70  Resp:  (!) _0 Temp:   98.6 F (37 C) 98.6 F (37 C)  TempSrc:   Oral Oral  SpO2: 95% 99% 99% 97%  Weight:      Height:        Intake/Output Summary (Last 24 hours) at 12/05/2017 1211 Last data filed at 12/05/2017 0102 Gross per 24 hour  Intake 250.23 ml  Output -  Net 250.23 ml   [employer]   12/04/17  2141  Weight: 113.4 kg (250 lb)    Examination: The patient was seen and examined.  Pertinent for left sided weakness, muscle atrophy, right pupil nonresponsive, vision loss complete in right eye.    Data Reviewed: I have personally reviewed following labs and imaging studies:  CBC: Recent Labs  Lab 12/04/17 2321 12/05/17 0454  WBC 8.9 7.9  NEUTROABS  --  6.5  HGB 13.1 12.4*  HCT 41.5 39.0  MCV 91.4  90.1  PLT 196 443   Basic Metabolic Panel: Recent Labs  Lab 12/04/17 2321 12/05/17 0454  NA 139 142  K 3.8 3.8  CL 107 106  CO2 25 26  GLUCOSE 111* 92  BUN 7* 9  CREATININE 1.04 1.06  CALCIUM 8.4* 8.5*  MG 2.1  --    GFR: Estimated Creatinine Clearance: 67.9 mL/min (by C-G formula based on SCr of 1.06 mg/dL). Liver Function Tests: Recent Labs  Lab 12/04/17 2321 12/05/17 0454  AST 19 20  ALT 13 13  ALKPHOS 78 75  BILITOT 1.4* 1.6*  PROT 6.0* 5.9*  ALBUMIN 2.9* 2.8*   No results for input(s): LIPASE, AMYLASE in the last 168 hours. No results for input(s): AMMONIA in the last 168 hours. Coagulation Profile: No results for input(s): INR, PROTIME in the last 168 hours. Cardiac Enzymes: Recent Labs  Lab 12/05/17 0454 12/05/17 1034  TROPONINI <0.03 <0.03   BNP (last 3 results) No results for input(s): PROBNP in the last 8760 hours. HbA1C: No results for input(s): HGBA1C in the last 72 hours. CBG: Recent Labs  Lab 12/04/17 2144  GLUCAP 95   Lipid Profile: No results for input(s): CHOL, HDL, LDLCALC, TRIG, CHOLHDL, LDLDIRECT in the last 72 hours. Thyroid Function Tests: Recent Labs    12/05/17 0454  TSH 1.712   Anemia Panel: No results for input(s): VITAMINB12, FOLATE, FERRITIN, TIBC, IRON, RETICCTPCT in the last 72 hours. Urine analysis:    Component Value Date/Time   COLORURINE AMBER (A) 12/04/2017 2144   APPEARANCEUR CLEAR 12/04/2017 2144   LABSPEC 1.018 12/04/2017 2144   PHURINE 6.0 12/04/2017 2144   GLUCOSEU NEGATIVE 12/04/2017 2144   HGBUR NEGATIVE 12/04/2017 2144   [place] NEGATIVE 12/04/2017 2144   [place] NEGATIVE 12/04/2017 2144   PROTEINUR NEGATIVE 12/04/2017 2144   NITRITE NEGATIVE 12/04/2017 2144   LEUKOCYTESUR NEGATIVE 12/04/2017 2144   Sepsis Labs: _0 (procalcitonin:4,lacticacidven:4)  )No results found for this or any previous visit (from the past 240 hour(s)).       Radiology Studies: Dg Chest 2  View  Result Date: 12/04/2017 CLINICAL DATA:  Vomiting.  Syncopal episode.  Crackles on the lungs. EXAM: CHEST - 2 VIEW COMPARISON:  None. FINDINGS: There is a focal area of airspace infiltration in the right lung base. This could indicate pneumonia or aspiration. Left lung is clear. No blunting of costophrenic angles. No pneumothorax. Heart size and pulmonary vascularity are normal. Calcification of the aorta. Mediastinal contours appear intact. Degenerative changes in the spine and shoulders. IMPRESSION: Focal infiltration in the right lung base may indicate pneumonia or aspiration. Electronically Signed   By: Lucienne Capers M.D.   On: 12/04/2017 23:10   Ct Head Wo Contrast  Result Date: 12/04/2017 CLINICAL DATA:  84 year old male with syncope. EXAM: CT HEAD WITHOUT CONTRAST TECHNIQUE: Contiguous axial images were obtained from the base of the skull through the vertex without intravenous contrast. COMPARISON:  None. FINDINGS: Brain: No evidence of acute infarction, hemorrhage, hydrocephalus, extra-axial collection or mass lesion/mass effect. Atrophy and chronic small-vessel  white matter ischemic changes are noted. Vascular: Intracranial atherosclerotic calcifications noted. Skull: Normal. Negative for fracture or focal lesion. Sinuses/Orbits: No acute finding. Other: None. IMPRESSION: 1. No evidence of acute intracranial abnormality. 2. Atrophy and chronic small-vessel white matter ischemic changes. Electronically Signed   By: Margarette Canada M.D.   On: 12/04/2017 23:56   Mr Brain Wo Contrast  Result Date: 12/05/2017 CLINICAL DATA:  Focal neurological deficit. Syncopal episode yesterday. EXAM: MRI HEAD WITHOUT CONTRAST TECHNIQUE: Multiplanar, multiecho pulse sequences of the brain and surrounding structures were obtained without intravenous contrast. COMPARISON:  CT 12/04/2017. FINDINGS: Brain: Diffusion imaging does not show any acute or subacute infarction. There is old infarction affecting the right side  of the pons. There are a few old small vessel cerebellar infarctions. Cerebral hemispheres show moderate changes of chronic small vessel disease affecting the deep and subcortical white matter. No large vessel territory infarction. No mass lesion, hemorrhage, hydrocephalus or extra-axial collection. Vascular: Major vessels at the base of the brain show flow. Single exception to this is apparent absent flow in the right vertebral artery. Skull and upper cervical spine: Negative Sinuses/Orbits: Clear/normal Other: None IMPRESSION: No acute finding. Old infarction affecting the right side of the pons. Moderate chronic small-vessel ischemic changes of the cerebellum and cerebral hemispheric white matter. Absent flow in the right vertebral artery. Electronically Signed   By: Nelson Chimes M.D.   On: 12/05/2017 09:33        Scheduled Meds: . clopidogrel  75 mg Oral Daily  . enoxaparin (LOVENOX) injection  40 mg Subcutaneous Q24H  . gabapentin  100 mg Oral Daily  . [START ON 12/06/2017] pneumococcal 23 valent vaccine  0.5 mL Intramuscular Tomorrow-1000  . risperiDONE  1.5 mg Oral BID  . sertraline  25 mg Oral QHS  . traZODone  50 mg Oral QHS   Continuous Infusions: . ampicillin-sulbactam (UNASYN) IV       LOS: 0 days    Time spent: 25 minutes    Edwin Dada, MD [facility] 12/05/2017, 12:11 PM     Pager [phone] --- please page though AMION:  [email or web address] Password [email or web address] If 7PM-7AM, please contact night-coverage

## 2017-12-05 NOTE — Progress Notes (Signed)
*  PRELIMINARY RESULTS* Vascular Ultrasound Carotid Duplex (Doppler) has been completed.   Preliminary findings: Velocities in the left and right carotid arteries are consistent with a 1-39% stenosis. Bilateral vertebral arteries demonstrate antegrade flow.    Leta JunglingCooper, Andrick Rust M 12/05/2017, 3:26 PM

## 2017-12-06 DIAGNOSIS — J189 Pneumonia, unspecified organism: Secondary | ICD-10-CM

## 2017-12-06 DIAGNOSIS — R001 Bradycardia, unspecified: Secondary | ICD-10-CM

## 2017-12-06 DIAGNOSIS — I495 Sick sinus syndrome: Secondary | ICD-10-CM

## 2017-12-06 LAB — LEGIONELLA PNEUMOPHILA SEROGP 1 UR AG: L. PNEUMOPHILA SEROGP 1 UR AG: NEGATIVE

## 2017-12-06 LAB — BASIC METABOLIC PANEL
ANION GAP: 8 (ref 5–15)
BUN: 9 mg/dL (ref 8–23)
CALCIUM: 8.7 mg/dL — AB (ref 8.9–10.3)
CO2: 26 mmol/L (ref 22–32)
Chloride: 106 mmol/L (ref 98–111)
Creatinine, Ser: 1.1 mg/dL (ref 0.61–1.24)
GFR calc Af Amer: >60 mL/min (ref >60)
Glucose, Bld: 96 mg/dL (ref 70–99)
Potassium: 3.6 mmol/L (ref 3.5–5.1)
Sodium: 140 mmol/L (ref 135–145)

## 2017-12-06 LAB — URINE CULTURE

## 2017-12-06 LAB — LIPID PANEL
CHOL/HDL RATIO: 5.4 ratio
Cholesterol: 239 mg/dL — ABNORMAL HIGH (ref 0–200)
HDL: 44 mg/dL (ref >40)
LDL CALC: 180 mg/dL — AB (ref 0–99)
Triglycerides: 74 mg/dL (ref <150)
VLDL: 15 mg/dL (ref 0–40)

## 2017-12-06 LAB — HIV ANTIBODY (ROUTINE TESTING W REFLEX): HIV Screen 4th Generation wRfx: NONREACTIVE

## 2017-12-06 LAB — HEMOGLOBIN A1C
Hgb A1c MFr Bld: 5.6 % (ref 4.8–5.6)
Mean Plasma Glucose: 114.02 mg/dL

## 2017-12-06 LAB — PROCALCITONIN: PROCALCITONIN: 0.28 ng/mL

## 2017-12-06 LAB — SEDIMENTATION RATE: Sed Rate: 29 mm/hr — ABNORMAL HIGH (ref 0–16)

## 2017-12-06 LAB — C-REACTIVE PROTEIN: CRP: 5.5 mg/dL — AB (ref <1.0)

## 2017-12-06 MED ORDER — ATORVASTATIN CALCIUM 80 MG PO TABS: 80.0000 mg | ORAL_TABLET | Freq: Every day | ORAL | Status: DC

## 2017-12-06 MED ORDER — ATORVASTATIN CALCIUM 40 MG PO TABS
40.0000 mg | ORAL_TABLET | Freq: Every day | ORAL | Status: DC
  Administered 2017-12-07 – 2017-12-08 (×2): 40 mg via ORAL
  Filled 2017-12-06 (×2): qty 1

## 2017-12-06 MED ORDER — RISPERIDONE 1 MG PO TABS
1.0000 mg | ORAL_TABLET | Freq: Two times a day (BID) | ORAL | Status: DC | PRN
  Filled 2017-12-06: qty 1

## 2017-12-06 NOTE — Progress Notes (Signed)
Patient had 18 beat run of V Tach. Asymptomatic getting vitals now. Dr. Maryfrances Bunnellanford text paged. Will continue to monitor.

## 2017-12-06 NOTE — Consult Note (Signed)
CC:  Chief Complaint  Patient presents with  . Loss of Consciousness    HPI: Dan Stone is a 84 y.o. male w/ POH of glaucoma, CE/IOL OU and PMH below who presents for evaluation of LOC. Ophthalmology consulted for painless loss of vision OD. Patient w/ dementia and story unclear.  Slightly tangential in conversation. Patient does not know what eye problems he has had in the past.  ROS: As per HPI PMH: Past Medical History:  Diagnosis Date  . Hypertension   . Stroke [facility](HCC)    5 years ago    PSH: Past Surgical History:  Procedure Laterality Date  . EYE SURGERY      Meds: No current facility-administered medications on file prior to encounter.    Current Outpatient Medications on File Prior to Encounter  Medication Sig Dispense Refill  . amLODipine (NORVASC) 10 MG tablet Take 5 mg by mouth at bedtime.     . brimonidine (ALPHAGAN) 0.2 % ophthalmic solution Place 1 drop into both eyes 2 (two) times daily.    . clopidogrel (PLAVIX) 75 MG tablet Take 75 mg by mouth daily.    Marland Kitchen. gabapentin (NEURONTIN) 100 MG capsule Take 100 mg by mouth daily.    . memantine (NAMENDA) 10 MG tablet Take 5 mg by mouth 2 (two) times daily.    . risperiDONE (RISPERDAL) 1 MG tablet Take 1.5 mg by mouth 2 (two) times daily.     . sertraline (ZOLOFT) 25 MG tablet Take 25 mg by mouth at bedtime.    . timolol (BETIMOL) 0.5 % ophthalmic solution Place 1 drop into both eyes 2 (two) times daily.    . traZODone (DESYREL) 50 MG tablet Take 50 mg by mouth at bedtime.      SH: Social History   Socioeconomic History  . Marital status: Single    Spouse name: Not on file  . Number of children: Not on file  . Years of education: Not on file  . Highest education level: Not on file  Occupational History  . Not on file  Social Needs  . Financial resource strain: Not on file  . Food insecurity:    Worry: Not on file    Inability: Not on file  . Transportation needs:    Medical: Not on file    Non-medical:  Not on file  Tobacco Use  . Smoking status: Never Smoker  . Smokeless tobacco: Never Used  Substance and Sexual Activity  . Alcohol use: Never    Frequency: Never  . Drug use: Never  . Sexual activity: Never  Lifestyle  . Physical activity:    Days per week: Not on file    Minutes per session: Not on file  . Stress: Not on file  Relationships  . Social connections:    Talks on phone: Not on file    Gets together: Not on file    Attends religious service: Not on file    Active member of club or organization: Not on file    Attends meetings of clubs or organizations: Not on file    Relationship status: Not on file  Other Topics Concern  . Not on file  Social History Narrative  . Not on file    FH: Family History  Problem Relation Age of Onset  . CAD Father     Exam:  Zenaida NieceVan: OD: HM OS: 20/200  CVF: OD: HM OS: full  EOM: OD: full d/v OS: full d/v  Pupils: OD: 3->2 mm, +  APD OS: 3->2 mm, no APD  IOP: by [place] OD: 16 OS: 14  External: OD: no periorbital edema, no proptosis,, good orbicularis strength OS: no periorbital edema, no proptosis, good orbicularis strength    Pen Light Exam: L/L: OD: WNL OS: WNL  C/S: OD: white and quiet OS: white and quiet  K: OD: clear, no abnormal staining OS: clear, no abnormal staining  A/C: OD: grossly deep and quiet appearing by pen light OS: grossly deep and quiet appearing by pen light  I: OD: round and regular OS: round and regular  L: OD: PCIOL OS: PCIOL  DFE: dilated @ 4:20 PM w/ Tropic and Phenyl OU  V: OD: clear OS: clear  N: OD: C/D 0.9, no disc edema OS: C/D 0.9, no disc edema  M: OD: flat, no obvious macular pathology OS: flat, no obvious macular pathology  V: OD: CRAO OS: normal appearing vessels  P: OD: retina flat 360, no obvious mass/RT/RD, PRP scars 360 OS: retina flat 360, no obvious mass/RT/RD  A/P:  1. CRAO OD: - Recommend stroke and embolic source work-up as  already done - Carotid, EKG +/- TTE, MRI Brain  - Discussed not treatment available - Recommend outpatient FU upon discharge.  2. Glaucoma OU: - Continue outpatient drops as prescribed  Disposition: - Outpatient FU w/ ophthalmologist closer to home - likely has retina specialist as appears had laser OD  [name] T. Sherryll Burger, MD [facility] [phone]

## 2017-12-06 NOTE — Evaluation (Signed)
Physical Therapy Evaluation Patient Details Name: Dan GhaziJames Stone MRN: [id number] DOB: 1933-12-12 Today's Date: 12/06/2017   History of Present Illness  [name] is a 84 y.o. 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 dx with pna, also with new onset R eye vision loss  Clinical Impression  Pt admitted with above diagnosis. Pt currently with functional limitations due to the deficits listed below (see PT Problem List). Pt weaker than his baseline and will benefit from PT in acute setting as well as f/u HHPT; encouraged pt to amb with nursing staff again later today, only able to amb in  Room this am d/t fatigue; Pt will benefit from skilled PT to increase their independence and safety with mobility to allow discharge to the venue listed below.     Follow Up Recommendations Home health PT;Supervision for mobility/OOB    Equipment Recommendations  None recommended by PT    Recommendations for Other Services       Precautions / Restrictions Precautions Precautions: Fall Restrictions Weight Bearing Restrictions: No      Mobility  Bed Mobility Overal bed mobility: Needs Assistance Bed Mobility: Supine to Sit;Sit to Supine     Supine to sit: Mod assist Sit to supine: Mod assist   General bed mobility comments: incr time, daughter attempts to assist  intermittently; assist with LEs onto bed, assist to bring trunk to upright  Transfers Overall transfer level: Needs assistance Equipment used: Rolling walker (2 wheeled) Transfers: Sit to/from Stand Sit to Stand: Min assist;Mod assist         General transfer comment: 3 sit to stand trials with decr assist needed each trial; cues for safe hand placement and anterior-superior wt shift  Ambulation/Gait Ambulation/Gait assistance: Min assist Gait Distance (Feet): 20 Feet Assistive  device: Rolling walker (2 wheeled) Gait Pattern/deviations: Step-to pattern;Step-through pattern;Drifts right/left     General Gait Details: cues for RW safety and position, cues for trunk extension and incr step length, pt is unsteady and requires assist to maintain balance during gait, fatigues quickly and does not want to amb outside room  Stairs            Wheelchair Mobility    Modified Rankin (Stroke Patients Only)       Balance Overall balance assessment: Needs assistance   Sitting balance-Leahy Scale: Fair Sitting balance - Comments: fair to poor, able to maintain static with incr time and cues Postural control: Posterior lean   Standing balance-Leahy Scale: Poor Standing balance comment: reliant on external support and UEs                             Pertinent Vitals/Pain      Home Living Family/patient expects to be discharged to:: Private residence(dtr) Living Arrangements: Children   Type of Home: House Home Access: Stairs to enter     Home Layout: Two level(plans to install ramp) Home Equipment: Dan HumphreysWalker - 2 wheels;Walker - 4 wheels      Prior Function Level of Independence: Needs assistance   Gait / Transfers Assistance Needed: walks with Rw or no AD  ADL's / Homemaking Assistance Needed: son assists with transfers        Hand Dominance        Extremity/Trunk Assessment   Upper Extremity Assessment Upper Extremity Assessment: Generalized weakness;LUE deficits/detail LUE Deficits /  Details: AAROM grossly WFL, dtr reports baseline deficits d/t previous CVA    Lower Extremity Assessment Lower Extremity Assessment: Generalized weakness;RLE deficits/detail;LLE deficits/detail RLE Deficits / Details: AROM WFL RLE Coordination: WNL LLE Deficits / Details: AAROM WFL LLE Coordination: decreased fine motor;decreased gross motor       Communication   Communication: No difficulties  Cognition Arousal/Alertness:  Awake/alert Behavior During Therapy: WFL for tasks assessed/performed Overall Cognitive Status: Within Functional Limits for tasks assessed                                        General Comments      Exercises     Assessment/Plan    PT Assessment Patient needs continued PT services  PT Problem List Decreased strength;Decreased activity tolerance;Decreased balance;Decreased mobility;Decreased knowledge of use of DME       PT Treatment Interventions DME instruction;Gait training;Functional mobility training;Therapeutic activities;Therapeutic exercise;Patient/family education    PT Goals (Current goals can be found in the Care Plan section)  Acute Rehab PT Goals Patient Stated Goal: feel better PT Goal Formulation: With patient Time For Goal Achievement: 12/20/17 Potential to Achieve Goals: Good    Frequency Min 3X/week   Barriers to discharge        Co-evaluation               AM-PAC PT "6 Clicks" Daily Activity  Outcome Measure Difficulty turning over in bed (including adjusting bedclothes, sheets and blankets)?: Unable Difficulty moving from lying on back to sitting on the side of the bed? : Unable Difficulty sitting down on and standing up from a chair with arms (e.g., wheelchair, bedside commode, etc,.)?: Unable Help needed moving to and from a bed to chair (including a wheelchair)?: A Little Help needed walking in hospital room?: A Little Help needed climbing 3-5 steps with a railing? : A Lot 6 Click Score: 11    End of Session Equipment Utilized During Treatment: Gait belt Activity Tolerance: Patient limited by fatigue Patient left: in bed;with call bell/phone within reach;with family/visitor present;with bed alarm set;Other (comment)(MD)   PT Visit Diagnosis: Muscle weakness (generalized) (M62.81);Unsteadiness on feet (R26.81)    Time: 4098-1191 PT Time Calculation (min) (ACUTE ONLY): 23 min   Charges:   PT Evaluation $PT Eval Low  Complexity: 1 Low PT Treatments $Gait Training: 8-22 mins   PT G Codes:        Drucilla Chalet, PT Pager:  12/06/2017   [facility] 12/06/2017, 11:03 AM

## 2017-12-06 NOTE — Care Management Note (Addendum)
Case Management Note  Patient Details  Name: Dan Stone MRN: [id number] Date of Birth: 1933/10/22  Subjective/Objective:      Admitted with Syncope, hx  Of mild dementia, community dwelling, CVA/ residual mild left sided weakness, and HTN, Recently moved from [place].Resides with daughter, Daphene JaegerRonita.         Sherren KernsRonita Stone (Daughter)     [phone]      PCP:   Action/Plan: Transition to home when medically stable. PT's recommendation: Home health PT;Supervision for mobility/OOB. Pt agreeable to home health services.   NCM scheduled post hospital follow up  for 12/18/2017 at 3:30 pm with Dr.Shannon Augustine RadarBanks, [facility].  Daughter to provide transportation to home once d/c.  Expected Discharge Date:                  Expected Discharge Plan:  Home w Home Health Services(From home with daugther)  In-House Referral:     Discharge planning Services  CM Consult  Post Acute Care Choice:    Choice offered to:  Patient, Adult Children(Dan Stone)  DME Arranged:    N/A DME Agency:   N/A  HH Arranged:  RN, PT, Nurse's Aide, Social Work [facility] Agency:  [facility], pending MD's order. NCM has requested order from MD.  Status of Service:  In process  If discussed at Long Length of Stay Meetings, dates discussed:    Additional Comments:  Epifanio LeschesCole, Anisha Starliper Hudson, RN 12/06/2017, 11:23 AM

## 2017-12-06 NOTE — Progress Notes (Signed)
   12/06/17 1536  Vitals  BP (!) 166/89  MAP (mmHg) 97  BP Location Right Arm  BP Method Automatic  Patient Position (if appropriate) Lying  Pulse Rate (!) 110   Pt lying in bed, no apparent distress, denies pain, palpitations, shortness of breath. Pt also does not appear to be in pain or dyspnic.

## 2017-12-06 NOTE — Progress Notes (Signed)
SLP Cancellation Note  Patient Details Name: Dan Stone MRN: [id number] DOB: 12/10/1933   Cancelled treatment:        Pt NPO for TEE. Will continue efforts.   Royce MacadamiaLitaker, Elynn Patteson Willis 12/06/2017, 12:26 PM  Breck CoonsLisa Willis Lonell FaceLitaker M.Ed [employer]SL[employer] Pager [phone]

## 2017-12-06 NOTE — Consult Note (Addendum)
Cardiology Consultation:   Patient ID: Dan Stone; [id number]; 1934/05/02   Admit date: 12/04/2017 Date of Consult: 12/06/2017  Primary Care Provider: Michel Santee, MD Primary Cardiologist: new to [facility], Dr. Wyline Mood Primary Electrophysiologist:  New   Patient Profile:   Dan Stone is a 84 y.o. male with a hx of HTN, CVA, dementia who is being seen today for the evaluation of bradycardia, syncope at the request of Dr. Wyline Mood.  History of Present Illness:   Dan Stone was admitted to [facility] after a witnessed syncopal event that occurred at home.  The patient is a poor historian, carries known dx of dementia.  His daughter is at bedside who was there for the event and provide PMHx and HPI.  She describes her father with memory loss, he lives with her and they provide help with daily needs, bathing, clothing, eating, but reports him as interactive with family and still some mobility and gets around with help as well.  She reports that on/off he has some mornings that he doesn't want to be bothered, needs urging/insistance to get up and get going.  Yesterday was one of those mornings, though when she she urged him to get up and going he was agreeable and did get up (sitting in bed).  Her son was giving him some food when he called out to her for help. When she got to the room he was vomiting and while vomiting slumped backwards in bed, and was lethargic to their calling to him/shaking him, they called 911.  FR arrived within moments being only a block or so away.  When they arrive he was sleepy/lethargic but answering them.  When they sat him up to get him into his wheelchair (this occurring as EMS was arriving), he fainted again, she noted this time his eyes rolled back and he was shaking.  She reports though shortly afterwards started responding to the EMS personal.  EMS records reviewed, upon arrival FD personal had arrived prior, narrative states on their arrival patient was awake/supine with  palpated SBG <100, upon being brought to a seated position he became syncopal again, and upon supine regained consciousness again.  1st documented BP was 78/48, initial tracing had V rates 70's, second in 60's  LABS K+ 3.8, 3.6 BUN/Creat 9/1.10 Trop I: <0.03 x3 WBC 7.9 H/H 12/39 Plts 177 TSH 1.712  TTE noted LVEF 60-65%, described severe AS with mead gradient and AVA of 0.69cm2   Past Medical History:  Diagnosis Date  . Hypertension   . Stroke [facility])    5 years ago    Past Surgical History:  Procedure Laterality Date  . EYE SURGERY        Inpatient Medications: Scheduled Meds: . clopidogrel  75 mg Oral Daily  . enoxaparin (LOVENOX) injection  40 mg Subcutaneous Q24H  . gabapentin  100 mg Oral Daily  . pneumococcal 23 valent vaccine  0.5 mL Intramuscular Tomorrow-1000  . risperiDONE  1.5 mg Oral BID  . sertraline  25 mg Oral QHS  . traZODone  50 mg Oral QHS   Continuous Infusions: . ampicillin-sulbactam (UNASYN) IV 1.5 g (12/06/17 0551)   PRN Meds: acetaminophen **OR** acetaminophen, hydrALAZINE, ondansetron **OR** ondansetron (ZOFRAN) IV  Allergies:   No Known Allergies  Social History:   Social History   Socioeconomic History  . Marital status: Single    Spouse name: Not on file  . Number of children: Not on file  . Years of education: Not on file  .  Highest education level: Not on file  Occupational History  . Not on file  Social Needs  . Financial resource strain: Not on file  . Food insecurity:    Worry: Not on file    Inability: Not on file  . Transportation needs:    Medical: Not on file    Non-medical: Not on file  Tobacco Use  . Smoking status: Never Smoker  . Smokeless tobacco: Never Used  Substance and Sexual Activity  . Alcohol use: Never    Frequency: Never  . Drug use: Never  . Sexual activity: Never  Lifestyle  . Physical activity:    Days per week: Not on file    Minutes per session: Not on file  . Stress: Not on file    Relationships  . Social connections:    Talks on phone: Not on file    Gets together: Not on file    Attends religious service: Not on file    Active member of club or organization: Not on file    Attends meetings of clubs or organizations: Not on file    Relationship status: Not on file  . Intimate partner violence:    Fear of current or ex partner: Not on file    Emotionally abused: Not on file    Physically abused: Not on file    Forced sexual activity: Not on file  Other Topics Concern  . Not on file  Social History Narrative  . Not on file    Family History:   Family History  Problem Relation Age of Onset  . CAD Father      ROS:  Please see the history of present illness.  All other ROS reviewed and negative.     Physical Exam/Data:   Vitals:   12/05/17 0614 12/05/17 1547 12/05/17 2115 12/06/17 0555  BP: (!) 129/104 130/61 135/79 (!) 146/95  Pulse: 70 66 77 80  Resp: 18 18 20 20   Temp: 98.6 F (37 C) 97.9 F (36.6 C) 97.7 F (36.5 C) 98.4 F (36.9 C)  TempSrc: Oral Oral Oral Oral  SpO2: 97% 97% 99% 95%  Weight:      Height:        Intake/Output Summary (Last 24 hours) at 12/06/2017 0732 Last data filed at 12/06/2017 0530 Gross per 24 hour  Intake -  Output 700 ml  Net -700 ml   Filed Weights   12/04/17 2141  Weight: 250 lb (113.4 kg)   Body mass index is 36.92 kg/m.  General:  Well nourished, well developed, in no acute distress HEENT: normal Lymph: no adenopathy Neck: no JVD Endocrine:  No thryomegaly Vascular: No carotid bruits Cardiac:  RRR; 1-2/6 SM, no gallops or rubs Lungs:  Diminished at th bases, no wheezing, rhonchi or rales  Abd: soft, nontender  Ext: no edema Musculoskeletal:  No deformities, age appropriate atrophy Skin: warm and dry  Neuro:  Known baseline dementia, reports of new R eye visual loss, no gross focal motor abnormalities noted Psych:  Normal affect, he is very plesent    EKG:  The EKG was personally reviewed and  demonstrates:   #1 with baseline artifact is difficult, SR 80bpm, QRS 76ms #2 is SR 64bpm, blocked PACs, not clearly mobitz I #3  Telemetry:  Telemetry was personally reviewed and demonstrates:  SR, generally rates 60's-70's, he has very brief slowing 40's-50's, this looks associated with blocked PACs, at times possibly MobitzI, no clear high AVblock, no persistent bradycardia, he does  have ST as well, 100-110.  No arrhythmias noted  Relevant CV Studies:  12/05/17 TTE Study Conclusions - Left ventricle: The cavity size was normal. Wall thickness was   increased in a pattern of mild LVH. Systolic function was normal.   The estimated ejection fraction was in the range of 60% to 65%.   Wall motion was normal; there were no regional wall motion   abnormalities. Doppler parameters are consistent with abnormal   left ventricular relaxation (grade 1 diastolic dysfunction). - Aortic valve: Mildly calcified annulus. Trileaflet; mildly   thickened, moderately calcified leaflets. There was severe   stenosis. Mean gradient (S): 31 mm Hg. VTI ratio of LVOT to   aortic valve: 0.2. Valve area (VTI): 0.69 cm^2. Valve area   (Vmean): 0.68 cm^2. - Mitral valve: Mildly calcified annulus. There was trivial   regurgitation. - Right atrium: Central venous pressure (est): 3 mm Hg. - Atrial septum: No defect or patent foramen ovale was identified. - Tricuspid valve: There was mild regurgitation. - Pulmonary arteries: PA peak pressure: 27 mm Hg (S). - Pericardium, extracardiac: There was no pericardial effusion.   Laboratory Data:  Chemistry Recent Labs  Lab 12/04/17 2321 12/05/17 0454  NA 139 142  K 3.8 3.8  CL 107 106  CO2 25 26  GLUCOSE 111* 92  BUN 7* 9  CREATININE 1.04 1.06  CALCIUM 8.4* 8.5*  GFRNONAA >60 >60  GFRAA >60 >60  ANIONGAP 7 10    Recent Labs  Lab 12/04/17 2321 12/05/17 0454  PROT 6.0* 5.9*  ALBUMIN 2.9* 2.8*  AST 19 20  ALT 13 13  ALKPHOS 78 75  BILITOT 1.4* 1.6*    Hematology Recent Labs  Lab 12/04/17 2321 12/05/17 0454  WBC 8.9 7.9  RBC 4.54 4.33  HGB 13.1 12.4*  HCT 41.5 39.0  MCV 91.4 90.1  MCH 28.9 28.6  MCHC 31.6 31.8  RDW 14.0 14.1  PLT 196 177   Cardiac Enzymes Recent Labs  Lab 12/05/17 0454 12/05/17 1034 12/05/17 1910  TROPONINI <0.03 <0.03 <0.03    Recent Labs  Lab 12/04/17 2325 12/05/17 0102  TROPIPOC 0.00 0.01    BNP Recent Labs  Lab 12/04/17 2321  BNP 172.2*    DDimer No results for input(s): DDIMER in the last 168 hours.  Radiology/Studies:   Dg Chest 2 View Result Date: 12/04/2017 CLINICAL DATA:  Vomiting.  Syncopal episode.  Crackles on the lungs. EXAM: CHEST - 2 VIEW COMPARISON:  None. FINDINGS: There is a focal area of airspace infiltration in the right lung base. This could indicate pneumonia or aspiration. Left lung is clear. No blunting of costophrenic angles. No pneumothorax. Heart size and pulmonary vascularity are normal. Calcification of the aorta. Mediastinal contours appear intact. Degenerative changes in the spine and shoulders. IMPRESSION: Focal infiltration in the right lung base may indicate pneumonia or aspiration. Electronically Signed   By: Burman Nieves M.D.   On: 12/04/2017 23:10   Ct Head Wo Contrast Result Date: 12/04/2017 CLINICAL DATA:  84 year old male with syncope. EXAM: CT HEAD WITHOUT CONTRAST TECHNIQUE: Contiguous axial images were obtained from the base of the skull through the vertex without intravenous contrast. COMPARISON:  None. FINDINGS: Brain: No evidence of acute infarction, hemorrhage, hydrocephalus, extra-axial collection or mass lesion/mass effect. Atrophy and chronic small-vessel white matter ischemic changes are noted. Vascular: Intracranial atherosclerotic calcifications noted. Skull: Normal. Negative for fracture or focal lesion. Sinuses/Orbits: No acute finding. Other: None. IMPRESSION: 1. No evidence of acute intracranial abnormality. 2.  Atrophy and chronic small-vessel  white matter ischemic changes. Electronically Signed   By: Harmon Pier M.D.   On: 12/04/2017 23:56    Mr Brain Wo Contrast Result Date: 12/05/2017 CLINICAL DATA:  Focal neurological deficit. Syncopal episode yesterday. EXAM: MRI HEAD WITHOUT CONTRAST TECHNIQUE: Multiplanar, multiecho pulse sequences of the brain and surrounding structures were obtained without intravenous contrast. COMPARISON:  CT 12/04/2017. FINDINGS: Brain: Diffusion imaging does not show any acute or subacute infarction. There is old infarction affecting the right side of the pons. There are a few old small vessel cerebellar infarctions. Cerebral hemispheres show moderate changes of chronic small vessel disease affecting the deep and subcortical white matter. No large vessel territory infarction. No mass lesion, hemorrhage, hydrocephalus or extra-axial collection. Vascular: Major vessels at the base of the brain show flow. Single exception to this is apparent absent flow in the right vertebral artery. Skull and upper cervical spine: Negative Sinuses/Orbits: Clear/normal Other: None IMPRESSION: No acute finding. Old infarction affecting the right side of the pons. Moderate chronic small-vessel ischemic changes of the cerebellum and cerebral hemispheric white matter. Absent flow in the right vertebral artery. Electronically Signed   By: Paulina Fusi M.D.   On: 12/05/2017 09:33    Assessment and Plan:   1. Syncope     Story sounds of vagal, hypotension (in association with AS), orthostatic     No hx of syncope previously      2. Bradycardia     No clear advanced heart block.  I will review EKGs and telemetry with Dr. Graciela Husbands     He will see this afternoon     I do not suspect need for pacing, though will keep him NPO for now  3. AS     No hx of fainting or heart failure     Would defer to clinical cardiology team for follow up and recs  4. Pneumonia     D/w Dr. Maryfrances Bunnell, though no way to know, he wonders if the patient wasn't  already brewing this pneumonia, perhaps volume depleted contributing to hypotension,  though fainted while vomiting so aspiration is possible      C/w IM  5. New R eye visual loss     Uncertain onset     Old infarct on MRI     Conservative management/out pt opthalmology f/u was recommended    For questions or updates, please contact [facility] Please consult [email or web address] for contact info under Cardiology/STEMI.   Signed, Sheilah Pigeon, PA-C  12/06/2017 7:32 AM   Syncope-neurally mediated associated with vomiting protracted hypotension  Sinus node dysfunction  Aortic stenosis-moderate?  Pneumonia  Right eye visual loss-acute   R carotids with minimal obstruction     The patient had vomiting associated syncope with protracted hypotension.  This occurred in the context of normal heart rate; almost certainly this was vaso-mediated.  There is no indication at this time for pacing  Neurological consultation might be of help.  His prior stroke and acute visual loss and answer as to mechanism.  No known atrial fibrillation but arteries potentially embolic events?  And if so evaluation for cardiac source of emboli and a loop recorder would be appropriate  Also, given his vomiting with eating and his pneumonia, raises the issue is whether there is aspiration.  The daughter denies choking with either eating or drinking, but she has only had a with her for the last week.  I wonder whether a swallowing study would be of  benefit.  Aortic stenosis has discrepant measurements with a mean gradient of only 30 which is moderate and a valve area of 0.7 or so which would be severe.    We will plan to review his echo.  Continue Plavix for secondary prevention for stroke.

## 2017-12-06 NOTE — Progress Notes (Signed)
PROGRESS NOTE    Wilber Fini  VAP:[id number] DOB: 1934-03-08 DOA: 12/04/2017 PCP: Sandrea Hammond, MD      Brief Narrative:  Mr. Grabe is a 84 y.o. 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  Patient unable to provide history, but daughter states he had been "kind of sluggish all day", and in the afternoon/evening was with grandson, seated, when he slumped and passed out.  Was unresponsive for some minutes, then woke and vomited.  EMS were called, who found the patient still very sluggish, less responsive, afebrile but HR 40s and BP 80/40, gave fluids and brought to the ER.  By time of arrival to ER BP normalized and patient more alert/near baseline, but was noted in the ER to have intermittent sinus arrhythmia and bradycardia to the 30s and 40s.  Of note, also CXR showing RIGHT base pneumonia.  Also, new onset complete visual loss in right eye.      Assessment & Plan:  Syncope See below, actually suspect this was from dehydration/pneumonia in setting of AV stenosis, if that is real.  Bradycardia Telemetry yesterday, with intermittent bradycardia, occasional 1.5-2s pauses.  Today, intermittent bradycardia, also some multifocal atrial tachycardia of some sort.  K and mag normal.  TSH normal.  Trops negative.  -Consult to Electrophysiology, appreciate cares   Aortic stenosis Echo yesterday with normal EF but AV area 0.7, although relatively low gradient.  BNP normal and no symptoms of heart failure.   -Consult Cardiology, re: TEE, appreciate cares -Maintain preload, hold afterload reduction   Community acquired pneumonia of right lower lobe Right lower lobar pneumonia, possibly aspiration related, doubt from vomiting event today (given lethargy preceding syncope).  Blood cultures negative.  Strep pneumo negative.  Procal  ordered -Continue Unasyn  Painless monocular vision loss MRI negative. Suspect CRAO, no floaters to suggest detachment, no systemic symptoms to suggest GCA.  Per patient, present for several days.  Per daughter, unknown how long present, but at this point, at least 24 hours.  Discussed with ophthalmology on call, Dr. Manuella Ghazi.  No therapeutic options this far out.  Will best be served with office eval after discharge to confirm, if still here tomorrow, Dr. Manuella Ghazi will see.  Lipids elevated.  A1c normal.  Carotids normal.   Echo no embolic source. -Follow ESR/CRP -Consult ophthalmology  Hypertension Secondary prevention of CVA -Hold amlodipine -Start atorvastatin -Continue Plavix  Other medications -Continue gabapentin  Dementia with behavioral disturbance -Hold Namenda given theoretical bradycardia risk -Continue rispiridone, sertraline, trazodone  Glaucoma -May continue eye drops      DVT prophylaxis: Lovenox Code Status: FULL Family Communication: Daughter at bedside MDM and disposition Plan: The below labs and imaging were reviewed and symmarized above, case discussed with Cardiology by phone.   The patient was admitted with syncope, irregular bradycardia and severe AS.  Cardiology consulted.  EP involved.  Possible eval for pacer today.   Also, incidetnally, CRAO suspected, embolic work up negative.  Outpatient follow up with Ophtho.    Lastly, pneumonia diagnosed incidentally.  Pt has PSI class IV, >8% mortality, warrants inpatient management with Iv antibiotics.      Objective: Vitals:   12/05/17 0614 12/05/17 1547 12/05/17 2115 12/06/17 0555  BP: (!) 129/104 130/61 135/79 (!) 146/95  Pulse: 70 66 77 80  Resp: _0 Temp: 98.6 F (37 C) 97.9 F (  36.6 C) 97.7 F (36.5 C) 98.4 F (36.9 C)  TempSrc: Oral Oral Oral Oral  SpO2: 97% 97% 99% 95%  Weight:      Height:        Intake/Output Summary (Last 24 hours) at 12/06/2017 1007 Last data filed at 12/06/2017  0900 Gross per 24 hour  Intake 0 ml  Output 700 ml  Net -700 ml   Filed Weights   12/04/17 2141  Weight: 113.4 kg (250 lb)    Examination:  General: Elderly male alert and in no distress.  Responds appropriately to questions.  Eye contact hygiene appropriate. HEENT: Corneas clear, conjunctivae and sclerae normal without injection or icterus, lids and lashes normal.  Lips normal.  Partially edentulous.  Visual tracking smooth.  OP moist without erythema, exudates, cobblestoning, or ulcers.  No airway deformities.  Neck supple.   Cardiac: Normal rate, irregularly irregular.   nl S1-S2, loud sys murmur.  Capillary refill is less than 2 seconds.  No LE edema.  Respiratory: Normal respiratory rate and rhythm.  CTAB without rales or wheezes. Abdomen: BS present.  No TTP or rebound all quadrants.  No masses or organomegaly.  No ascites, distension. Extremities: No deformities/injuries.   Neuro: Sensorium intact.Cranial nerves normal except right pupil vision absent (only sees lights inperiphery, faintly), no right pupil response to light.  Speech is fluent.  Naming is grossly intact, and the patient's recall, recent and remote, as well as general fund of knowledge seem within normal limits.  Muscle tone normal, without fasciculations. 5/5 grip strength and upper extremity flexion/extension, symmetrically.  Extremities are warm and well-perfused. Attention span and concentration are within normal limits.  Psych: Mood "good", full range of affect.  Normal rate and rhythm of speech.  Thought content appropriate, and thought process linear.  No SI/HI, aural or visual hallucinations or delusions. Attention and concentration are normal.  Mild dementia.     Data Reviewed: I have personally reviewed following labs and imaging studies:  CBC: Recent Labs  Lab 12/04/17 2321 12/05/17 0454  WBC 8.9 7.9  NEUTROABS  --  6.5  HGB 13.1 12.4*  HCT 41.5 39.0  MCV 91.4 90.1  PLT 196 022   Basic Metabolic  Panel: Recent Labs  Lab 12/04/17 2321 12/05/17 0454 12/06/17 0617  NA 139 142 140  K 3.8 3.8 3.6  CL 107 106 106  CO2 _0 GLUCOSE 111* 92 96  BUN 7* 9 9  CREATININE 1.04 1.06 1.10  CALCIUM 8.4* 8.5* 8.7*  MG 2.1  --   --    GFR: Estimated Creatinine Clearance: 65.4 mL/min (by C-G formula based on SCr of 1.1 mg/dL). Liver Function Tests: Recent Labs  Lab 12/04/17 2321 12/05/17 0454  AST 19 20  ALT 13 13  ALKPHOS 78 75  BILITOT 1.4* 1.6*  PROT 6.0* 5.9*  ALBUMIN 2.9* 2.8*   No results for input(s): LIPASE, AMYLASE in the last 168 hours. No results for input(s): AMMONIA in the last 168 hours. Coagulation Profile: No results for input(s): INR, PROTIME in the last 168 hours. Cardiac Enzymes: Recent Labs  Lab 12/05/17 0454 12/05/17 1034 12/05/17 1910  TROPONINI <0.03 <0.03 <0.03   BNP (last 3 results) No results for input(s): PROBNP in the last 8760 hours. HbA1C: Recent Labs    12/06/17 0617  HGBA1C 5.6   CBG: Recent Labs  Lab 12/04/17 2144  GLUCAP 95   Lipid Profile: Recent Labs    12/06/17 0617  CHOL 239*  HDL 44  LDLCALC 180*  TRIG 74  CHOLHDL 5.4   Thyroid Function Tests: Recent Labs    12/05/17 0454  TSH 1.712   Anemia Panel: No results for input(s): VITAMINB12, FOLATE, FERRITIN, TIBC, IRON, RETICCTPCT in the last 72 hours. Urine analysis:    Component Value Date/Time   COLORURINE AMBER (A) 12/04/2017 2144   APPEARANCEUR CLEAR 12/04/2017 2144   LABSPEC 1.018 12/04/2017 2144   PHURINE 6.0 12/04/2017 2144   GLUCOSEU NEGATIVE 12/04/2017 2144   HGBUR NEGATIVE 12/04/2017 2144   [place] NEGATIVE 12/04/2017 2144   [place] NEGATIVE 12/04/2017 2144   PROTEINUR NEGATIVE 12/04/2017 2144   NITRITE NEGATIVE 12/04/2017 2144   LEUKOCYTESUR NEGATIVE 12/04/2017 2144   Sepsis Labs: _0 (procalcitonin:4,lacticacidven:4)  ) Recent Results (from the past 240 hour(s))  Urine culture     Status: Abnormal (Preliminary result)    Collection Time: 12/05/17  4:30 AM  Result Value Ref Range Status   Specimen Description URINE, RANDOM  Final   Special Requests NONE  Final   Culture (A)  Final    40,000 COLONIES/mL UNIDENTIFIED ORGANISM Performed at [facility], [address] [place], [place] [address]    Report Status PENDING  Incomplete         Radiology Studies: Dg Chest 2 View  Result Date: 12/04/2017 CLINICAL DATA:  Vomiting.  Syncopal episode.  Crackles on the lungs. EXAM: CHEST - 2 VIEW COMPARISON:  None. FINDINGS: There is a focal area of airspace infiltration in the right lung base. This could indicate pneumonia or aspiration. Left lung is clear. No blunting of costophrenic angles. No pneumothorax. Heart size and pulmonary vascularity are normal. Calcification of the aorta. Mediastinal contours appear intact. Degenerative changes in the spine and shoulders. IMPRESSION: Focal infiltration in the right lung base may indicate pneumonia or aspiration. Electronically Signed   By: Lucienne Capers M.D.   On: 12/04/2017 23:10   Ct Head Wo Contrast  Result Date: 12/04/2017 CLINICAL DATA:  84 year old male with syncope. EXAM: CT HEAD WITHOUT CONTRAST TECHNIQUE: Contiguous axial images were obtained from the base of the skull through the vertex without intravenous contrast. COMPARISON:  None. FINDINGS: Brain: No evidence of acute infarction, hemorrhage, hydrocephalus, extra-axial collection or mass lesion/mass effect. Atrophy and chronic small-vessel white matter ischemic changes are noted. Vascular: Intracranial atherosclerotic calcifications noted. Skull: Normal. Negative for fracture or focal lesion. Sinuses/Orbits: No acute finding. Other: None. IMPRESSION: 1. No evidence of acute intracranial abnormality. 2. Atrophy and chronic small-vessel white matter ischemic changes. Electronically Signed   By: Margarette Canada M.D.   On: 12/04/2017 23:56   Mr Brain Wo Contrast  Result Date: 12/05/2017 CLINICAL DATA:  Focal  neurological deficit. Syncopal episode yesterday. EXAM: MRI HEAD WITHOUT CONTRAST TECHNIQUE: Multiplanar, multiecho pulse sequences of the brain and surrounding structures were obtained without intravenous contrast. COMPARISON:  CT 12/04/2017. FINDINGS: Brain: Diffusion imaging does not show any acute or subacute infarction. There is old infarction affecting the right side of the pons. There are a few old small vessel cerebellar infarctions. Cerebral hemispheres show moderate changes of chronic small vessel disease affecting the deep and subcortical white matter. No large vessel territory infarction. No mass lesion, hemorrhage, hydrocephalus or extra-axial collection. Vascular: Major vessels at the base of the brain show flow. Single exception to this is apparent absent flow in the right vertebral artery. Skull and upper cervical spine: Negative Sinuses/Orbits: Clear/normal Other: None IMPRESSION: No acute finding. Old infarction affecting the right side of the pons. Moderate  chronic small-vessel ischemic changes of the cerebellum and cerebral hemispheric white matter. Absent flow in the right vertebral artery. Electronically Signed   By: Nelson Chimes M.D.   On: 12/05/2017 09:33        Scheduled Meds: . atorvastatin  40 mg Oral q1800  . clopidogrel  75 mg Oral Daily  . enoxaparin (LOVENOX) injection  40 mg Subcutaneous Q24H  . gabapentin  100 mg Oral Daily  . pneumococcal 23 valent vaccine  0.5 mL Intramuscular Tomorrow-1000  . risperiDONE  1.5 mg Oral BID  . sertraline  25 mg Oral QHS  . traZODone  50 mg Oral QHS   Continuous Infusions: . ampicillin-sulbactam (UNASYN) IV 1.5 g (12/06/17 0551)     LOS: 1 day    Time spent: 35 minutes    Edwin Dada, MD [facility] 12/06/2017, 10:07 AM     Pager [phone] --- please page though AMION:  [email or web address] Password [email or web address] If 7PM-7AM, please contact night-coverage

## 2017-12-07 DIAGNOSIS — I350 Nonrheumatic aortic (valve) stenosis: Secondary | ICD-10-CM

## 2017-12-07 DIAGNOSIS — H349 Unspecified retinal vascular occlusion: Secondary | ICD-10-CM

## 2017-12-07 DIAGNOSIS — H3411 Central retinal artery occlusion, right eye: Secondary | ICD-10-CM

## 2017-12-07 LAB — BASIC METABOLIC PANEL
Anion gap: 12 (ref 5–15)
BUN: 7 mg/dL — AB (ref 8–23)
CO2: 25 mmol/L (ref 22–32)
Calcium: 8.8 mg/dL — ABNORMAL LOW (ref 8.9–10.3)
Chloride: 104 mmol/L (ref 98–111)
Creatinine, Ser: 0.96 mg/dL (ref 0.61–1.24)
GFR calc Af Amer: >60 mL/min (ref >60)
GFR calc non Af Amer: >60 mL/min (ref >60)
GLUCOSE: 104 mg/dL — AB (ref 70–99)
POTASSIUM: 3.3 mmol/L — AB (ref 3.5–5.1)
Sodium: 141 mmol/L (ref 135–145)

## 2017-12-07 LAB — SEDIMENTATION RATE: SED RATE: 46 mm/h — AB (ref 0–16)

## 2017-12-07 LAB — C-REACTIVE PROTEIN: CRP: 6.1 mg/dL — ABNORMAL HIGH (ref <1.0)

## 2017-12-07 LAB — CBC
HEMATOCRIT: 40 % (ref 39.0–52.0)
Hemoglobin: 13 g/dL (ref 13.0–17.0)
MCH: 28.8 pg (ref 26.0–34.0)
MCHC: 32.5 g/dL (ref 30.0–36.0)
MCV: 88.7 fL (ref 78.0–100.0)
PLATELETS: 176 10*3/uL (ref 150–400)
RBC: 4.51 MIL/uL (ref 4.22–5.81)
RDW: 13.7 % (ref 11.5–15.5)
WBC: 5.2 10*3/uL (ref 4.0–10.5)

## 2017-12-07 LAB — PROCALCITONIN: Procalcitonin: 0.2 ng/mL

## 2017-12-07 MED ORDER — HALOPERIDOL 2 MG PO TABS
2.0000 mg | ORAL_TABLET | Freq: Every day | ORAL | Status: DC
  Administered 2017-12-07: 2 mg via ORAL
  Filled 2017-12-07: qty 1

## 2017-12-07 MED ORDER — SODIUM CHLORIDE 0.9 % IV SOLN
INTRAVENOUS | Status: DC
  Administered 2017-12-07: 10:00:00 via INTRAVENOUS

## 2017-12-07 MED ORDER — DONEPEZIL HCL 5 MG PO TABS
5.0000 mg | ORAL_TABLET | Freq: Every day | ORAL | Status: DC
  Administered 2017-12-07: 5 mg via ORAL
  Filled 2017-12-07: qty 1

## 2017-12-07 NOTE — Progress Notes (Signed)
PROGRESS NOTE    Taiquan Campanaro  ZMO:[id number] DOB: 08/02/1933 DOA: 12/04/2017 PCP: Sandrea Hammond, MD      Brief Narrative:  Mr. Raffel is a 84 y.o. 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  Patient unable to provide history, but daughter states he had been "kind of sluggish all day", and in the afternoon/evening was with grandson, seated, when he slumped and passed out.  Was unresponsive for some minutes, then woke and vomited.  EMS were called, who found the patient still very sluggish, less responsive, afebrile but HR 40s and BP 80/40, gave fluids and brought to the ER.  By time of arrival to ER BP normalized and patient more alert/near baseline, but was noted in the ER to have intermittent sinus arrhythmia and bradycardia to the 30s and 40s.  Of note, also CXR showing RIGHT base pneumonia.  Also, new onset complete visual loss in right eye.      Assessment & Plan:  Syncope EP have seen. Given prolonged hypotension after event, suspect this was vagally mediated.  Therefore, do not recommend pacer.       Bradycardia Tachyarrhythmia Tele in last 24 hours mostly with irregular narrow complex tachycardia.  Afib?  K and mag normal.  TSH normal.  Trops negative.  EP have signed off.  -Repeat ECG -If this is Afib, his CHADS2Vasc is at least 5, HASBLED 2, would warrant [facility] -Consult Cardiology, appreciate cares  Aortic stenosis Echo with normal EF but AV area 0.7, although relatively low gradient.  BNP normal and no symptoms of heart failure.   -Consult Cardiology, re: TEE, appreciate cares -Maintain preload, hold afterload reduction  Community acquired pneumonia of right lower lobe Right lower lobar pneumonia, possibly aspiration related, given history of vomiting (although this history/timing of vomiting/presence of choking varies  with each telling).    Blood cultures negative.  Strep pneumo negative.  No WBC or fever, but Procal elevated.  SLP eval will be of low utility in setting of his acute delirium. -Continue Unasyn -SLP eval ordered  Central retinal artery occlusion. MRI negative. No floaters to suggest detachment, no systemic symptoms to suggest GCA, and ESR level lowers post-test prob.  Per patient, present for several days.  Per daughter, unknown how long present, but at this point, at least 24 hours.  Discussed with ophthalmology on call, Dr. Manuella Ghazi.  No therapeutic options this far out.  Lipids elevated.  A1c normal.  Carotids normal.   Echo no embolic source.  Tele appears to show Afib. -Consult ophthalmology, appreciate cares -Statin started -Needs outpatient Ophtho follow up, with Dr. Manuella Ghazi if staying in [facility]  Delirium Dementia with behavioral disturbance Delirium worsening in hospital.  Significant sundowning. -Hold Namenda given theoretical bradycardia risk -Continue sertraline, trazodone -Hold risperidone, substitute oral Haldol at night while in hospital  Hypertension Secondary prevention of CVA -Hold amlodipine -Continue statin -Continue Plavix for now   Other medications -Continue gabapentin  Glaucoma -Continue eye drops      DVT prophylaxis: Lovenox Code Status: FULL Family Communication: Daughter at bedside MDM and disposition Plan: The below labs and imaging were reviewed and summarized above.   Paitent admitted with syncope.  Found to have pneumonia, AS, tachyarrhythmia and CRAO.    Evaluated by EP, Cardiology, Ophthamology.    If Afib, will need anticoagulation.    EP suggest possibly a loop recorder if  he has recurrent syncope to look for bradycardias, but no indication to do so at this time because we suspect his event was vagally mediated.  PT recommend SNF, family have declined.  Will ambulate today, likely to home with [facility] when work up plans for aortic valve is  clear.  Likely tomorrow.        Objective: Vitals:   12/06/17 0555 12/06/17 1507 12/06/17 1536 12/07/17 0456  BP: (!) 146/95 (!) 150/136 (!) 166/89 (!) 146/98  Pulse: 80 80 (!) 110 80  Resp: 20 20  16   Temp: 98.4 F (36.9 C) 97.8 F (36.6 C)  98.9 F (37.2 C)  TempSrc: Oral Oral  Oral  SpO2: 95% 100%  98%  Weight:      Height:        Intake/Output Summary (Last 24 hours) at 12/07/2017 0844 Last data filed at 12/07/2017 0804 Gross per 24 hour  Intake 0 ml  Output 700 ml  Net -700 ml   Filed Weights   12/04/17 2141  Weight: 113.4 kg (250 lb)    Examination:  General: Elderly male, lying in bed, no acute distress, distracted but interactive, smiling HEENT: Corneas clear, conjunctive are normal without injection or icterus, lids and lashes normal.  Lips normal, partially edentulous.  Visual tracking Smith, oropharynx moist without erythema, exudate, cobblestoning, or ulcers.  Neck supple.    Cardiac: Irregularly irregular, rate normal.  Mild systolic murmur.  No lower extremity edema. Respiratory: Auscultation, normal respiratory rate and rhythm. Abdomen: Abdomen soft without tenderness to palpation, guarding or rebound, no hepatospleno megaly. Extremities: No deformities/injuries.   Neuro: Cranial nerves normal except right afferent pupillary reflex, speech fluent, moves all extremities with normal strength and coordination.  Psych: Full range of affect, attention diminished, memory impaired.  Judgment and insight appear grossly impaired.    Data Reviewed: I have personally reviewed following labs and imaging studies:  CBC: Recent Labs  Lab 12/04/17 2321 12/05/17 0454 12/07/17 0705  WBC 8.9 7.9 5.2  NEUTROABS  --  6.5  --   HGB 13.1 12.4* 13.0  HCT 41.5 39.0 40.0  MCV 91.4 90.1 88.7  PLT 196 177 537   Basic Metabolic Panel: Recent Labs  Lab 12/04/17 2321 12/05/17 0454 12/06/17 0617 12/07/17 0705  NA 139 142 140 141  K 3.8 3.8 3.6 3.3*  CL 107 106 106  104  CO2 25 26 26 25   GLUCOSE 111* 92 96 104*  BUN 7* 9 9 7*  CREATININE 1.04 1.06 1.10 0.96  CALCIUM 8.4* 8.5* 8.7* 8.8*  MG 2.1  --   --   --    GFR: Estimated Creatinine Clearance: 74.9 mL/min (by C-G formula based on SCr of 0.96 mg/dL). Liver Function Tests: Recent Labs  Lab 12/04/17 2321 12/05/17 0454  AST 19 20  ALT 13 13  ALKPHOS 78 75  BILITOT 1.4* 1.6*  PROT 6.0* 5.9*  ALBUMIN 2.9* 2.8*   No results for input(s): LIPASE, AMYLASE in the last 168 hours. No results for input(s): AMMONIA in the last 168 hours. Coagulation Profile: No results for input(s): INR, PROTIME in the last 168 hours. Cardiac Enzymes: Recent Labs  Lab 12/05/17 0454 12/05/17 1034 12/05/17 1910  TROPONINI <0.03 <0.03 <0.03   BNP (last 3 results) No results for input(s): PROBNP in the last 8760 hours. HbA1C: Recent Labs    12/06/17 0617  HGBA1C 5.6   CBG: Recent Labs  Lab 12/04/17 2144  GLUCAP 95   Lipid Profile: Recent Labs  12/06/17 0617  CHOL 239*  HDL 44  LDLCALC 180*  TRIG 74  CHOLHDL 5.4   Thyroid Function Tests: Recent Labs    12/05/17 0454  TSH 1.712   Anemia Panel: No results for input(s): VITAMINB12, FOLATE, FERRITIN, TIBC, IRON, RETICCTPCT in the last 72 hours. Urine analysis:    Component Value Date/Time   COLORURINE AMBER (A) 12/04/2017 2144   APPEARANCEUR CLEAR 12/04/2017 2144   LABSPEC 1.018 12/04/2017 2144   PHURINE 6.0 12/04/2017 2144   GLUCOSEU NEGATIVE 12/04/2017 2144   HGBUR NEGATIVE 12/04/2017 2144   [place] NEGATIVE 12/04/2017 2144   [place] NEGATIVE 12/04/2017 2144   PROTEINUR NEGATIVE 12/04/2017 2144   NITRITE NEGATIVE 12/04/2017 2144   LEUKOCYTESUR NEGATIVE 12/04/2017 2144   Sepsis Labs: @LABRCNTIP (procalcitonin:4,lacticacidven:4)  ) Recent Results (from the past 240 hour(s))  Urine culture     Status: Abnormal   Collection Time: 12/05/17  4:30 AM  Result Value Ref Range Status   Specimen Description URINE, RANDOM  Final     Special Requests NONE  Final   Culture (A)  Final    40,000 COLONIES/mL GROUP B STREP(S.AGALACTIAE)ISOLATED TESTING AGAINST S. AGALACTIAE NOT ROUTINELY PERFORMED DUE TO PREDICTABILITY OF AMP/PEN/VAN SUSCEPTIBILITY. Performed at [facility], [place] [address], [place], [place] [address]    Report Status 12/06/2017 FINAL  Final  Culture, blood (routine x 2) Call MD if unable to obtain prior to antibiotics being given     Status: None (Preliminary result)   Collection Time: 12/05/17  4:45 AM  Result Value Ref Range Status   Specimen Description BLOOD RIGHT HAND  Final   Special Requests   Final    BOTTLES DRAWN AEROBIC ONLY Blood Culture adequate volume   Culture   Final    NO GROWTH 2 DAYS Performed at [facility], [place] [address], [place], [place] [address]    Report Status PENDING  Incomplete  Culture, blood (routine x 2) Call MD if unable to obtain prior to antibiotics being given     Status: None (Preliminary result)   Collection Time: 12/05/17  4:50 AM  Result Value Ref Range Status   Specimen Description BLOOD RIGHT HAND  Final   Special Requests   Final    BOTTLES DRAWN AEROBIC ONLY Blood Culture adequate volume   Culture   Final    NO GROWTH 2 DAYS Performed at [facility], [address], [place], [place] [address]    Report Status PENDING  Incomplete         Radiology Studies: Mr Brain Wo Contrast  Result Date: 12/05/2017 CLINICAL DATA:  Focal neurological deficit. Syncopal episode yesterday. EXAM: MRI HEAD WITHOUT CONTRAST TECHNIQUE: Multiplanar, multiecho pulse sequences of the brain and surrounding structures were obtained without intravenous contrast. COMPARISON:  CT 12/04/2017. FINDINGS: Brain: Diffusion imaging does not show any acute or subacute infarction. There is old infarction affecting the right side of the pons. There are a few old small vessel cerebellar infarctions. Cerebral hemispheres show moderate changes of chronic small vessel  disease affecting the deep and subcortical white matter. No large vessel territory infarction. No mass lesion, hemorrhage, hydrocephalus or extra-axial collection. Vascular: Major vessels at the base of the brain show flow. Single exception to this is apparent absent flow in the right vertebral artery. Skull and upper cervical spine: Negative Sinuses/Orbits: Clear/normal Other: None IMPRESSION: No acute finding. Old infarction affecting the right side of the pons. Moderate chronic small-vessel ischemic changes of the cerebellum and  cerebral hemispheric white matter. Absent flow in the right vertebral artery. Electronically Signed   By: Nelson Chimes M.D.   On: 12/05/2017 09:33        Scheduled Meds: . atorvastatin  40 mg Oral q1800  . clopidogrel  75 mg Oral Daily  . gabapentin  100 mg Oral Daily  . risperiDONE  1.5 mg Oral BID  . sertraline  25 mg Oral QHS  . traZODone  50 mg Oral QHS   Continuous Infusions: . ampicillin-sulbactam (UNASYN) IV 1.5 g (12/07/17 0443)     LOS: 2 days    Time spent: 25 minutes    Edwin Dada, MD [facility] 12/07/2017, 8:44 AM     Pager [phone] --- please page though AMION:  [email or web address] Password [email or web address] If 7PM-7AM, please contact night-coverage

## 2017-12-07 NOTE — Consult Note (Signed)
NEURO HOSPITALIST CONSULT NOTE   Requestig physician: Dr. Loleta Books  Reason for Consult: LOC, painless loss of vision in right eye  History obtained from:  Patient Chart  ( patient unreliable historian)  HPI:                                                                                                                                          Dan Stone is an 84 y.o. 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   Patient is unable to provide a history and no family is present at bedside. Per chart review patient had been "kind of sluggish all day", and in the afternoon while seated he slumped and passed out. He was unresponsive for some minutes and then woke up and vomited. EMS was called and found a still very sluggish and less responsive patient. BP for EMS was 80/40, afebrile and HR in 40's. He was given fluids which normalized the BP, but remained bradycardic to 30's and 40's.  The syncopal spell is felt to be most likely due to dehydration and pneumonia in the setting of possible AV stenosis and intermittent bradycardia seen on telemetry here. A TEE is planned and Cardiology has been consulted. The syncopal spell may have been vagally mediated per today's hospitalist note. Cardiology does not recommend a pacemaker.   Ophthalmology was consulted and diagnosed the patient with CRAO of the right eye. MRI brain did not show any evidence for occipital lobe infarction. Given that the CRAO has been present for at least 24 hours, there are no therapeutic options per ophthalmology. Echocardiogram showed no embolic source, but telemetry showed atrial fibrillation.   Hospital Course: CT head showed no evidence of acute intracranial abnormality. Atrophy and chronic small vessel white matter ischemic changes. MRI  brain : no acute finding. Old infarction on the right side of the pons. Absent flow in the right vertebral artery. Lipid panel: LDL 180, Cholesterol 239.  Home medications include atorvastatin and Plavix.     Past Medical History:  Diagnosis Date  . Hypertension   . Stroke [facility])    5 years ago    Past Surgical History:  Procedure Laterality Date  . EYE SURGERY      Family History  Problem Relation Age of Onset  . CAD Father          Social History:  reports that he has never smoked. He has never used smokeless tobacco. He reports that he does not drink alcohol or use drugs.  No Known Allergies  MEDICATIONS:  Scheduled: . atorvastatin  40 mg Oral q1800  . clopidogrel  75 mg Oral Daily  . gabapentin  100 mg Oral Daily  . risperiDONE  1.5 mg Oral BID  . sertraline  25 mg Oral QHS  . traZODone  50 mg Oral QHS   Continuous: . sodium chloride 10 mL/hr at 12/07/17 1000  . ampicillin-sulbactam (UNASYN) IV 1.5 g (12/07/17 1002)   XFG:[id number] **OR** acetaminophen, hydrALAZINE, ondansetron **OR** ondansetron (ZOFRAN) IV, risperiDONE   ROS:                                                                                                                                       History  unobtainable from patient due to mental status    Blood pressure (!) 146/98, pulse 80, temperature 98.9 F (37.2 C), temperature source Oral, resp. rate 16, height _0  (1.753 m), weight 113.4 kg (250 lb), SpO2 98 %.   General Examination:                                                                                                       Physical Exam  HEENT-  Normocephalic, no lesions, without obvious abnormality.  Normal external eye and conjunctiva.   Cardiovascular- S1-S2 audible,    Lungs-no rhonchi or wheezing noted, no excessive working breathing.  Saturations within  normal limits on RA Extremities- Warm, dry and intact Musculoskeletal-no joint tenderness, deformity or swelling Skin-warm and dry, intact  Neurological Examination Mental Status: ( unreliable exam) Somnolent. Patient not cooperative with exam. He did not want to stay awake and would follow some commands but not others. Patient able to open and close eyes. Patient denies any vision changes.  Cranial Nerves: Patient able to track to left and right. There is no blink to threat in right eye. Intact finger counting in left eye, otherwise not compliant with visual acuity examination.   Right eye ptosis present,  Right pupil is irregular with sluggish reactivity 4 mm >> 3 mm. Left pupil round and reactive 3 mm >> 2 mm. Motor/ sensory: Patient able to move all 4 extremities to noxious stimuli. Patient attempted to move bilateral legs on command,  But also lacked effort.  Able to localize and withdraw in all 4 extremities.  Plantars: Right: downgoing  Left: downgoing Cerebellar: The patient was noncompliant with this portion of the exam.   Gait: deferred   Lab Results: Basic Metabolic Panel: Recent Labs  Lab 12/04/17 2321 12/05/17 0454 12/06/17 0617 12/07/17  0705  NA 139 142 140 141  K 3.8 3.8 3.6 3.3*  CL 107 106 106 104  CO2 _0 GLUCOSE 111* 92 96 104*  BUN 7* 9 9 7*  CREATININE 1.04 1.06 1.10 0.96  CALCIUM 8.4* 8.5* 8.7* 8.8*  MG 2.1  --   --   --     CBC: Recent Labs  Lab 12/04/17 2321 12/05/17 0454 12/07/17 0705  WBC 8.9 7.9 5.2  NEUTROABS  --  6.5  --   HGB 13.1 12.4* 13.0  HCT 41.5 39.0 40.0  MCV 91.4 90.1 88.7  PLT 196 177 176    Cardiac Enzymes: Recent Labs  Lab 12/05/17 0454 12/05/17 1034 12/05/17 1910  TROPONINI <0.03 <0.03 <0.03    Lipid Panel: Recent Labs  Lab 12/06/17 0617  CHOL 239*  TRIG 74  HDL 44  CHOLHDL 5.4  VLDL 15  LDLCALC 180*    History and examination documented by Laurey Morale, MSN, NP-C, [facility] [phone]   Impression:  84 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 Per ophthalmology, too far out to treat. Regarding etiology, 90% of CRAO are due to embolic phenomenon, either atheroembolic from dislodged atherosclerotic plaque in ICA, arterioembolic from thrombus formation on ICA plaque with distal embolization, or cardioembolic. Given atrial fibrillation on telemetry this admission, the etiology is most likely cardioembolic. Inflammatory arteritis is the etiology for 10% of cases, but unlikely with this patient as ESR was normal.  2. Workup for cardioembolic or atheroembolic source almost complete. TEE should be obtained. TTE showed no mural thrombus. Carotid ultrasound with 1-39% ICA stenosis bilaterally. MRI brain without occipital lobe infarction. MRA head and TEE should be obtained.  3. Dementia with behavioral disturbance. Namenda was held due to bradycardia risk. Aricept is recommended if there is no medical contraindication. Would use one of the milder neuroleptics such as Seroquel for management if needed. Avoid Haldol.   Recommendations: 1. MRA head (ordered) 2. TEE 3. Aricept 5 mg po qhs (ordered) 4. Consider starting anticoagulation if atrial fibrillation is confirmed, otherwise add ASA to Plavix for DAPT.  5. Outpatient Ophthalmology follow up.    Electronically signed: Dr. Kerney Elbe 12/07/2017, 10:48 AM

## 2017-12-07 NOTE — Progress Notes (Signed)
  Speech Language Pathology Treatment: Dysphagia  Patient Details Name: Dan Stone MRN: [id number] DOB: 1933/11/25 Today's Date: 12/07/2017 Time: [id number] SLP Time Calculation (min) (ACUTE ONLY): 17 min  Assessment / Plan / Recommendation Clinical Impression  Skilled observation with intake of thin via cup/straw, puree and soft solids with pt with continued cognitive-based dysphagia and oral holding noted with decreased sustained attention and minimal verbal cues provided for decreasing distractibility during intake as this places pt at mild risk for aspiration, as well as oral clearance with lingual sweep with soft solids; pt given mixed consistency to assess safety with intake and no overt s/s of aspiration noted throughout dysphagia tx with any consistency; pt may benefit from medication provided with puree given decreased cognitive status; ST will f/u for diet tolerance and education provided for family for dysphagia management as able during acute stay.    HPI HPI: Pt is an 84 y.o. male with history of hypertension, stroke, dementia who was brought to the ER after patient had a syncopal episode witnessed by patient's daughter and associated with light-headedness, nausea, and vomiting. Pt has also had a 3 day h/o poor vision in his R eye. MRI negative for acute findings. CXR concerning for possible aspiration, therefore swallow evaluation was ordered.      SLP Plan  Continue with current plan of care       Recommendations  Diet recommendations: Regular;Thin liquid Liquids provided via: Cup;Straw Medication Administration: Whole meds with puree Supervision: Patient able to self feed;Staff to assist with self feeding Compensations: Minimize environmental distractions;Slow rate;Small sips/bites;Lingual sweep for clearance of pocketing Postural Changes and/or Swallow Maneuvers: Seated upright 90 degrees                Oral Care Recommendations: Oral care BID Follow up  Recommendations: 24 hour supervision/assistance SLP Visit Diagnosis: Dysphagia, oral phase (R13.11) Plan: Continue with current plan of care                       Tressie StalkerPat Raysean Graumann, M.S., CCC-SLP 12/07/2017, 11:06 AM

## 2017-12-07 NOTE — Progress Notes (Signed)
Progress Note  Patient Name: Dan GhaziJames Brockel Date of Encounter: 12/07/2017  Primary Cardiologist: No primary care provider on file. New- seen by Dr Wyline MoodBranch Electrophysiology: Dr Graciela HusbandsKlein  Subjective   Patient feels well. Rambling history. Denies dyspnea, chest pain, dizziness.  Inpatient Medications    Scheduled Meds: . atorvastatin  40 mg Oral q1800  . clopidogrel  75 mg Oral Daily  . gabapentin  100 mg Oral Daily  . risperiDONE  1.5 mg Oral BID  . sertraline  25 mg Oral QHS  . traZODone  50 mg Oral QHS   Continuous Infusions: . ampicillin-sulbactam (UNASYN) IV 1.5 g (12/07/17 0443)   PRN Meds: acetaminophen **OR** acetaminophen, hydrALAZINE, ondansetron **OR** ondansetron (ZOFRAN) IV, risperiDONE   Vital Signs    Vitals:   12/06/17 0555 12/06/17 1507 12/06/17 1536 12/07/17 0456  BP: (!) 146/95 (!) 150/136 (!) 166/89 (!) 146/98  Pulse: 80 80 (!) 110 80  Resp: 20 20  16   Temp: 98.4 F (36.9 C) 97.8 F (36.6 C)  98.9 F (37.2 C)  TempSrc: Oral Oral  Oral  SpO2: 95% 100%  98%  Weight:      Height:        Intake/Output Summary (Last 24 hours) at 12/07/2017 0955 Last data filed at 12/07/2017 0804 Gross per 24 hour  Intake 0 ml  Output 700 ml  Net -700 ml   Filed Weights   12/04/17 2141  Weight: 250 lb (113.4 kg)    Telemetry    Wandering atrial pacemaker. Some episodes of tachycardia that appear to be MAT. No definite Afib. - Personally Reviewed  ECG    None today - Personally Reviewed  Physical Exam   GEN: No acute distress.   Neck: No JVD Cardiac: IRRR, harsh gr 3/6 systolic murmur right and left USB. No diastolic murmur. Respiratory: Clear to auscultation bilaterally. GI: Soft, nontender, non-distended  MS: No edema; No deformity. Neuro:  Nonfocal  Psych: Normal affect   Labs    Chemistry Recent Labs  Lab 12/04/17 2321 12/05/17 0454 12/06/17 0617 12/07/17 0705  NA 139 142 140 141  K 3.8 3.8 3.6 3.3*  CL 107 106 106 104  CO2 25 26 26 25     GLUCOSE 111* 92 96 104*  BUN 7* 9 9 7*  CREATININE 1.04 1.06 1.10 0.96  CALCIUM 8.4* 8.5* 8.7* 8.8*  PROT 6.0* 5.9*  --   --   ALBUMIN 2.9* 2.8*  --   --   AST 19 20  --   --   ALT 13 13  --   --   ALKPHOS 78 75  --   --   BILITOT 1.4* 1.6*  --   --   GFRNONAA >60 >60 >60 >60  GFRAA >60 >60 >60 >60  ANIONGAP 7 10 8 12      Hematology Recent Labs  Lab 12/04/17 2321 12/05/17 0454 12/07/17 0705  WBC 8.9 7.9 5.2  RBC 4.54 4.33 4.51  HGB 13.1 12.4* 13.0  HCT 41.5 39.0 40.0  MCV 91.4 90.1 88.7  MCH 28.9 28.6 28.8  MCHC 31.6 31.8 32.5  RDW 14.0 14.1 13.7  PLT 196 177 176    Cardiac Enzymes Recent Labs  Lab 12/05/17 0454 12/05/17 1034 12/05/17 1910  TROPONINI <0.03 <0.03 <0.03    Recent Labs  Lab 12/04/17 2325 12/05/17 0102  TROPIPOC 0.00 0.01     BNP Recent Labs  Lab 12/04/17 2321  BNP 172.2*     DDimer No results for  input(s): DDIMER in the last 168 hours.   Radiology    No results found.  Cardiac Studies   Echo 12/05/17: Study Conclusions  - Left ventricle: The cavity size was normal. Wall thickness was   increased in a pattern of mild LVH. Systolic function was normal.   The estimated ejection fraction was in the range of 60% to 65%.   Wall motion was normal; there were no regional wall motion   abnormalities. Doppler parameters are consistent with abnormal   left ventricular relaxation (grade 1 diastolic dysfunction). - Aortic valve: Mildly calcified annulus. Trileaflet; mildly   thickened, moderately calcified leaflets. There was severe   stenosis. Mean gradient (S): 31 mm Hg. VTI ratio of LVOT to   aortic valve: 0.2. Valve area (VTI): 0.69 cm^2. Valve area   (Vmean): 0.68 cm^2. - Mitral valve: Mildly calcified annulus. There was trivial   regurgitation. - Right atrium: Central venous pressure (est): 3 mm Hg. - Atrial septum: No defect or patent foramen ovale was identified. - Tricuspid valve: There was mild regurgitation. - Pulmonary  arteries: PA peak pressure: 27 mm Hg (S). - Pericardium, extracardiac: There was no pericardial effusion.  Patient Profile     84 y.o. male history of HTN, CVA, dementia admitted with witnessed syncope.     Assessment & Plan    1. Syncope - seen by EP. Felt to be related to hypotension. ? In setting of PNA +/- vagal mediated. No significant bradycardia noted on monitor here. No indication for pacemaker. 2. Aortic stenosis. I personally reviewed Echo and valve is in the moderate to severe range but not critical. Patient is a poor candidate for AVR at this point due to co-morbidities including dementia. I don't think we need additional evaluation of this at this time since treatment options are non existent even if it is severe. Would repeat TTE in 6 months 3. Acute visual loss right eye. Due to central retinal artery occlusion. No significant carotid disease. ? Embolic. Discussed with Dr. Maryfrances Bunnell and Dr Graciela Husbands. Will proceed with TEE this admission for further embolic work up. Dr. Graciela Husbands would like Neuro to weigh in on whether they feel this is likely embolic. If so an ILR would be indicated to assess need for anticoagulation. 4. Wandering atrial pacemaker/MAT. No Afib seen on telemetry.   5. PNA per primary team.  For questions or updates, please contact [facility] Please consult [email or web address] for contact info under Cardiology/STEMI.      Signed, Jeramy Dimmick [place], MD  12/07/2017, 9:55 AM

## 2017-12-08 ENCOUNTER — Inpatient Hospital Stay (HOSPITAL_COMMUNITY): Payer: Medicare Other

## 2017-12-08 DIAGNOSIS — I498 Other specified cardiac arrhythmias: Secondary | ICD-10-CM

## 2017-12-08 LAB — BASIC METABOLIC PANEL
ANION GAP: 7 (ref 5–15)
BUN: 7 mg/dL — ABNORMAL LOW (ref 8–23)
CALCIUM: 8.4 mg/dL — AB (ref 8.9–10.3)
CHLORIDE: 110 mmol/L (ref 98–111)
CO2: 26 mmol/L (ref 22–32)
Creatinine, Ser: 0.93 mg/dL (ref 0.61–1.24)
GFR calc non Af Amer: >60 mL/min (ref >60)
Glucose, Bld: 100 mg/dL — ABNORMAL HIGH (ref 70–99)
POTASSIUM: 3.4 mmol/L — AB (ref 3.5–5.1)
Sodium: 143 mmol/L (ref 135–145)

## 2017-12-08 LAB — CBC
HEMATOCRIT: 37.8 % — AB (ref 39.0–52.0)
HEMOGLOBIN: 11.9 g/dL — AB (ref 13.0–17.0)
MCH: 28.4 pg (ref 26.0–34.0)
MCHC: 31.5 g/dL (ref 30.0–36.0)
MCV: 90.2 fL (ref 78.0–100.0)
Platelets: 175 10*3/uL (ref 150–400)
RBC: 4.19 MIL/uL — AB (ref 4.22–5.81)
RDW: 14.3 % (ref 11.5–15.5)
WBC: 4.1 10*3/uL (ref 4.0–10.5)

## 2017-12-08 MED ORDER — QUETIAPINE FUMARATE 25 MG PO TABS: 50.0000 mg | ORAL_TABLET | Freq: Every day | ORAL | Status: DC

## 2017-12-08 MED ORDER — QUETIAPINE FUMARATE 25 MG PO TABS
25.0000 mg | ORAL_TABLET | Freq: Every day | ORAL | Status: DC
  Administered 2017-12-08: 25 mg via ORAL
  Filled 2017-12-08: qty 1

## 2017-12-08 MED ORDER — ASPIRIN 81 MG PO CHEW
81.0000 mg | CHEWABLE_TABLET | Freq: Every day | ORAL | Status: DC
  Administered 2017-12-08 – 2017-12-09 (×2): 81 mg via ORAL
  Filled 2017-12-08 (×2): qty 1

## 2017-12-08 NOTE — Progress Notes (Signed)
Physical Therapy Treatment Patient Details Name: Dan Stone MRN: [id number] DOB: 1933-09-18 Today's Date: 12/08/2017    History of Present Illness [name] is a 84 y.o. M with hx mild dementia, community dwelling, CVA with residual mild left sided weakness, and HTN, recently moved from [place] to live near daughter, who presents after syncopal episode. dx with pna, also with new onset R eye vision loss    PT Comments    Pt with functional decline since PT eval on Friday. Pt was at home with daughter and grandkids and [facility] Pt ambulated in home distances and required 24/7 supervision due to memory deficits. Pt now requiring maxAx2 for all mobility. Pt follows commands and participates in PT however pt continues to state "My legs are weak". Pt to benefit from aggressive PT program for a week to regain strength and balance to return home with daughter and family. Returning to home quicker will be better for patient's mental status and will help minimize delirium and further confusion from baseline dementia. Pt with good home set up and very supportive family.     Follow Up Recommendations  CIR     Equipment Recommendations  None recommended by PT(TBD by next venue)    Recommendations for Other Services       Precautions / Restrictions Precautions Precautions: Fall Restrictions Weight Bearing Restrictions: No    Mobility  Bed Mobility Overal bed mobility: Needs Assistance Bed Mobility: Supine to Sit     Supine to sit: Max assist;+2 for physical assistance     General bed mobility comments: increased time, pt intitiated bringing LEs off EOB however was unable to sequence bringing trunk up to sit EOB. once pt sitting EOb safely pt able to maintain  Transfers Overall transfer level: Needs assistance Equipment used: Rolling walker (2 wheeled) Transfers: Sit to/from [employer] CorporationStand;Stand Pivot Transfers Sit to Stand: Mod assist;Max assist;+2 physical  assistance Stand pivot transfers: +2 physical assistance;Max assist;Mod assist(2 person transfer with gait belt, no RW)       General transfer comment: pt initiated powering up but required modAx2 to achieve full standing, completed 3 stands and a stand pivot, daughter provided tactile cues to buttocks to facilitate stepping to chair as patient wasn't following commands or initiating. Pt states "my legs are tired"  Ambulation/Gait             General Gait Details: unable to today   Stairs             Wheelchair Mobility    Modified Rankin (Stroke Patients Only)       Balance Overall balance assessment: Needs assistance Sitting-balance support: Bilateral upper extremity supported Sitting balance-Leahy Scale: Fair     Standing balance support: Bilateral upper extremity supported Standing balance-Leahy Scale: Poor Standing balance comment: dependent on physical assist                            Cognition Arousal/Alertness: Awake/alert(initially lethargic but easily awoken) Behavior During Therapy: WFL for tasks assessed/performed Overall Cognitive Status: History of cognitive impairments - at baseline                                 General Comments: pt with dementia, daughter reports it to be worse at night. at night is when "he gets all hyper". pt did follow all commands and participate in therapy. stated name and birthdate.  Exercises      General Comments General comments (skin integrity, edema, etc.): spoke extensively with daughter to acquire history as patient just recently moved in with dtr in [place], has been living in [place]. Pt reports patient was able to walk around the house with verbal cues and minA with RW. pt does wear depends. pt was alson in a rehab facility in [place] for 2-3 weeks prior to coming to [place]      Pertinent Vitals/Pain Pain Assessment: Faces Faces Pain Scale: No hurt    Home Living                       Prior Function            PT Goals (current goals can now be found in the care plan section) Acute Rehab PT Goals Patient Stated Goal: didn't state, daughter states "I want him to take 1 week of rehab" Progress towards PT goals: Not progressing toward goals - comment    Frequency    Min 3X/week      PT Plan Discharge plan needs to be updated    Co-evaluation              AM-PAC PT "6 Clicks" Daily Activity  Outcome Measure  Difficulty turning over in bed (including adjusting bedclothes, sheets and blankets)?: Unable Difficulty moving from lying on back to sitting on the side of the bed? : Unable Difficulty sitting down on and standing up from a chair with arms (e.g., wheelchair, bedside commode, etc,.)?: Unable Help needed moving to and from a bed to chair (including a wheelchair)?: Total Help needed walking in hospital room?: Total Help needed climbing 3-5 steps with a railing? : Total 6 Click Score: 6    End of Session Equipment Utilized During Treatment: Gait belt Activity Tolerance: Patient limited by fatigue Patient left: in chair;with call bell/phone within reach;with nursing/sitter in room;with family/visitor present Nurse Communication: Mobility status PT Visit Diagnosis: Muscle weakness (generalized) (M62.81)     Time: 1610-9604 PT Time Calculation (min) (ACUTE ONLY): 45 min  Charges:  $Therapeutic Activity: 38-52 mins                    G Codes:       Lewis Shock, PT, DPT Pager #: [phone] Office #: [phone]    Ayumi Wangerin M Srinika Delone 12/08/2017, 3:34 PM

## 2017-12-08 NOTE — Progress Notes (Signed)
PROGRESS NOTE    Dan Stone  ZOX:[id number] DOB: 1933/07/17 DOA: 12/04/2017 PCP: Michel Santee, MD      Brief Narrative:  Mr. Mahler is a 84 y.o. 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  Found in ER to have pneumonia on CXR.  Also prolonged hypotension, without fever or white count.  Suspect vagal outflow from pneumonia, leading to syncope.  Incidental finding of right eye blindness.  There was initial misinformation that this was sudden and NEW.  Work up for CRAO was begun.  Subsequently, family from [place] reported blindness was old.  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         Assessment & Plan:  Syncope EP have seen. Given prolonged hypotension after event, this was clearly vagally mediated.  Therefore, do not recommend pacer.      Community acquired pneumonia of right lower lobe Right lower lobar pneumonia, possibly aspiration related, given history of vomiting (although this history/timing of vomiting/presence of choking varies with each telling).  SLP evaluated, no further recommendations.  Blood cultures negative.  Strep pneumo negative.  No WBC or fever, but Procal elevated.    -Continue Unasyn, day 4 of 7    Glaucoma Central retinal VEIN occlusion, chronic Right eye blindness, chronic  Additional history from family today contradicts earlier testimony from daughter that blindness was new.  Blindness they think might be old.  Obtained name of Ophthalmologist in [place], Dr. Ronnell Freshwater, who CONFIRMS patient has CHRONIC blindness in right eye, from CRVO from hypertension/diabetes/glaucoma and has had no light perception in right eye for some time.   -No further work up for embolic event necessary  Multifocal atrial tachycardia Wandering pacemaker Atrial fibrillation not  present. -If repeat syncopal event, referral to EP and placement of ILR  Moderate Aortic stenosis Echo with normal EF but AV area 0.7, although relatively low gradient.  BNP normal and no symptoms of heart failure.   -Consult Cardiology -Repeat TTE in 6 months  Delirium Dementia with behavioral disturbance Delirium worsening in hospital.  Significant sundowning. -Hold Namenda and Donepezil given bradycardia risk -Continue sertraline, trazodone -Will trial Seroquel for Haldol tonight  Hypertension Secondary prevention of CVA Hyperlipidemia BP fluctuating despite off amlodipine -Hold amlodipine -Continue statin -Continue Plavix, add aspirin given MRA  Other medications -Continue gabapentin       DVT prophylaxis: Lovenox Code Status: FULL Family Communication: Daughter at bedside MDM and disposition Plan: The below labs and imaging were reviewed and summarized above.  Outside records from Optho summarized above.    Paitent admitted with syncope.  Found to have pneumonia.  Evaluated by EP given intermittent bradycardia, event ruled vagal, no further work up planned.  Found to have aortic stenosis.  Evaluated by Cardiology, seems chronic, moderate, plan for repeat Echo 6 months.   Evaluated by Ophthalmology, needs to establish with Ophtho here after discharge.    EP suggest possibly a loop recorder if he has recurrent syncope to look for bradycardias, but no indication to do so at this time because we suspect his event was vagally mediated.  PT recommend CIR vs SNF.  Will plan for transition in next 24-48 hours.        Objective: Vitals:   12/07/17 0456 12/07/17 1311 12/07/17 2049 12/08/17 0600  BP: (!) 146/98 136/83 119/66 (!) 152/85  Pulse: 80 91 64 (!) 56  Resp: 16 17 18 16   Temp: 98.9 F (37.2 C) 98 F (36.7 C) 97.7 F (36.5 C) 98.5 F (36.9 C)  TempSrc: Oral  Oral Oral  SpO2: 98% 100% 99% 98%  Weight:      Height:        Intake/Output Summary (Last  24 hours) at 12/08/2017 1339 Last data filed at 12/08/2017 1610 Gross per 24 hour  Intake -  Output 250 ml  Net -250 ml   Filed Weights   12/04/17 2141  Weight: 113.4 kg (250 lb)    Examination:  General: Elderly male, lying in bed, very sleepy, slowly arousable, falls back asleep. HEENT: Corneas clear, conjunctive are normal without injection or icterus, lids and lashes normal.  Right eye afferent pupil defect, left eye normal.  Partially edentulous, lips normal, oropharynx moist, no oral lesions.  Hearing normal.    Cardiac: Irregularly irregular, tachycardic, systolic murmur.  No lower extremity edema. Respiratory:  rate normal, lungs clear without rales or wheezes. Abdomen: Abdomen soft without tenderness to palpation, hepatosplenomegaly, distention Extremities: No deformities/injuries.   Neuro: Cranial nerves normal except right afferent pupillary reflex, speech fluent, moves all extremities with normal strength and symmetric strength, normal coordination. Psych: Attention diminished today, affect blunted today, very sleepy.  Memory impaired, judgment insight impaired.    Data Reviewed: I have personally reviewed following labs and imaging studies:  CBC: Recent Labs  Lab 12/04/17 2321 12/05/17 0454 12/07/17 0705 12/08/17 0624  WBC 8.9 7.9 5.2 4.1  NEUTROABS  --  6.5  --   --   HGB 13.1 12.4* 13.0 11.9*  HCT 41.5 39.0 40.0 37.8*  MCV 91.4 90.1 88.7 90.2  PLT 196 177 176 175   Basic Metabolic Panel: Recent Labs  Lab 12/04/17 2321 12/05/17 0454 12/06/17 0617 12/07/17 0705 12/08/17 0624  NA 139 142 140 141 143  K 3.8 3.8 3.6 3.3* 3.4*  CL 107 106 106 104 110  CO2 25 26 26 25 26   GLUCOSE 111* 92 96 104* 100*  BUN 7* 9 9 7* 7*  CREATININE 1.04 1.06 1.10 0.96 0.93  CALCIUM 8.4* 8.5* 8.7* 8.8* 8.4*  MG 2.1  --   --   --   --    GFR: Estimated Creatinine Clearance: 77.4 mL/min (by C-G formula based on SCr of 0.93 mg/dL). Liver Function Tests: Recent Labs  Lab  12/04/17 2321 12/05/17 0454  AST 19 20  ALT 13 13  ALKPHOS 78 75  BILITOT 1.4* 1.6*  PROT 6.0* 5.9*  ALBUMIN 2.9* 2.8*   No results for input(s): LIPASE, AMYLASE in the last 168 hours. No results for input(s): AMMONIA in the last 168 hours. Coagulation Profile: No results for input(s): INR, PROTIME in the last 168 hours. Cardiac Enzymes: Recent Labs  Lab 12/05/17 0454 12/05/17 1034 12/05/17 1910  TROPONINI <0.03 <0.03 <0.03   BNP (last 3 results) No results for input(s): PROBNP in the last 8760 hours. HbA1C: Recent Labs    12/06/17 0617  HGBA1C 5.6   CBG: Recent Labs  Lab 12/04/17 2144  GLUCAP 95   Lipid Profile: Recent Labs    12/06/17 0617  CHOL 239*  HDL 44  LDLCALC 180*  TRIG 74  CHOLHDL 5.4   Thyroid Function Tests: No results for input(s): TSH, T4TOTAL, FREET4, T3FREE, THYROIDAB in the last 72 hours. Anemia Panel: No results for input(s): VITAMINB12, FOLATE, FERRITIN, TIBC, IRON, RETICCTPCT in the last 72 hours. Urine analysis:    Component Value Date/Time  COLORURINE AMBER (A) 12/04/2017 2144   APPEARANCEUR CLEAR 12/04/2017 2144   LABSPEC 1.018 12/04/2017 2144   PHURINE 6.0 12/04/2017 2144   GLUCOSEU NEGATIVE 12/04/2017 2144   HGBUR NEGATIVE 12/04/2017 2144   BILIRUBINUR NEGATIVE 12/04/2017 2144   KETONESUR NEGATIVE 12/04/2017 2144   PROTEINUR NEGATIVE 12/04/2017 2144   NITRITE NEGATIVE 12/04/2017 2144   LEUKOCYTESUR NEGATIVE 12/04/2017 2144   Sepsis Labs: @LABRCNTIP (procalcitonin:4,lacticacidven:4)  ) Recent Results (from the past 240 hour(s))  Urine culture     Status: Abnormal   Collection Time: 12/05/17  4:30 AM  Result Value Ref Range Status   Specimen Description URINE, RANDOM  Final   Special Requests NONE  Final   Culture (A)  Final    40,000 COLONIES/mL GROUP B STREP(S.AGALACTIAE)ISOLATED TESTING AGAINST S. AGALACTIAE NOT ROUTINELY PERFORMED DUE TO PREDICTABILITY OF AMP/PEN/VAN SUSCEPTIBILITY. Performed at [facility], [address] [place], [place] [address]    Report Status 12/06/2017 FINAL  Final  Culture, blood (routine x 2) Call MD if unable to obtain prior to antibiotics being given     Status: None (Preliminary result)   Collection Time: 12/05/17  4:45 AM  Result Value Ref Range Status   Specimen Description BLOOD RIGHT HAND  Final   Special Requests   Final    BOTTLES DRAWN AEROBIC ONLY Blood Culture adequate volume   Culture   Final    NO GROWTH 3 DAYS Performed at [facility], [address], [place], [place] [address]    Report Status PENDING  Incomplete  Culture, blood (routine x 2) Call MD if unable to obtain prior to antibiotics being given     Status: None (Preliminary result)   Collection Time: 12/05/17  4:50 AM  Result Value Ref Range Status   Specimen Description BLOOD RIGHT HAND  Final   Special Requests   Final    BOTTLES DRAWN AEROBIC ONLY Blood Culture adequate volume   Culture   Final    NO GROWTH 3 DAYS Performed at [facility], [address], [place], [place] [address]    Report Status PENDING  Incomplete         Radiology Studies: Mr Shirlee Latch QI Contrast  Result Date: 12/08/2017 CLINICAL DATA:  Follow-up examination for acute stroke. EXAM: MRA HEAD WITHOUT CONTRAST TECHNIQUE: Angiographic images of the Circle of Willis were obtained using MRA technique without intravenous contrast. COMPARISON:  Prior MRI from 12/05/2017. FINDINGS: ANTERIOR CIRCULATION: Distal cervical segments of the internal carotid arteries are patent with antegrade flow. Petrous segments patent bilaterally. Scattered atheromatous irregularity within the cavernous ICAs without hemodynamically significant stenosis. ICA termini patent. Left A1 patent. Right A1 hypoplastic and/or absent. Normal anterior communicating artery. Anterior cerebral arteries demonstrate mild atheromatous irregularity but are patent to their distal aspects without high-grade stenosis. Mild atheromatous  change within the M1 segments without significant stenosis. Normal MCA bifurcations. Distal MCA branches well perfused and symmetric. POSTERIOR CIRCULATION: Left vertebral artery patent to the vertebrobasilar junction without high-grade stenosis. Patent left PICA. Right vertebral artery not visualize, likely occluded. Right PICA not visualized. Basilar demonstrates scattered atheromatous irregularity without high-grade stenosis. Superior cerebral arteries patent bilaterally. Both of the posterior cerebral arteries primarily supplied via the basilar. Atheromatous irregularity throughout the PCAs bilaterally without significant stenosis. PCAs are patent to their distal aspects. No intracranial aneurysm. IMPRESSION: 1. Occluded right vertebral artery. Otherwise patent vertebrobasilar system. 2. Moderate atheromatous irregularity throughout the intracranial circulation without proximal high-grade or correctable stenosis. Electronically  Signed   By: Rise MuBenjamin  McClintock M.D.   On: 12/08/2017 03:52        Scheduled Meds: . aspirin  81 mg Oral Daily  . atorvastatin  40 mg Oral q1800  . clopidogrel  75 mg Oral Daily  . gabapentin  100 mg Oral Daily  . QUEtiapine  50 mg Oral QHS  . sertraline  25 mg Oral QHS  . traZODone  50 mg Oral QHS   Continuous Infusions: . sodium chloride 10 mL/hr at 12/07/17 1000  . ampicillin-sulbactam (UNASYN) IV 1.5 g (12/08/17 1137)     LOS: 3 days    Time spent: 25 minutes   Alberteen Samhristopher P Danford, MD [facility] 12/08/2017, 1:39 PM     Pager [phone] --- please page though AMION:  [email or web address] Password [email or web address] If 7PM-7AM, please contact night-coverage

## 2017-12-08 NOTE — Progress Notes (Signed)
MRA completed, revealing occluded right vertebral artery, which is most likely chronic. There is also moderate atheromatous irregularity throughout the intracranial circulation without proximal high-grade or correctable stenosis.  Impression:  84 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 Per ophthalmology, too far out to treat. Regarding etiology, 90% of CRAO are due to embolic phenomenon, either atheroembolic from dislodged atherosclerotic plaque in ICA, arterioembolic from thrombus formation on ICA plaque with distal embolization, or cardioembolic. Given atrial fibrillation on telemetry this admission, the etiology is most likely cardioembolic. Inflammatory arteritis is the etiology for 10% of cases, but unlikely with this patient as ESR was normal.  2. Workup for cardioembolic or atheroembolic source almost complete. TEE should be obtained. TTE showed no mural thrombus. Carotid ultrasound with 1-39% ICA stenosis bilaterally. MRI brain without occipital lobe infarction.  3. Dementia with behavioral disturbance. Namenda was held due to bradycardia risk. Aricept is recommended if there is no medical contraindication. Would use one of the milder neuroleptics such as Seroquel for management if needed. Avoid Haldol.   Recommendations: 1. Given intracranial atherosclerosis on MRA that is fairly extensive, DAPT with ASA and Plavix is indicated. ASA 81 mg po qd has been ordered. However, if atrial fibrillation is diagnosed, then will need to start on anticoagulation. If anticoagulation is started, then DAPT can be stopped.  2. TEE. If mural thrombus is seen, then switch to oral anticoagulation.  3. Continue Aricept 5 mg po qhs 4. Outpatient Ophthalmology follow up for right CRAO.  5. Neurology will sign off. Please call if there are additional questions.   Electronically signed: Dr. Kerney Elbe

## 2017-12-08 NOTE — Progress Notes (Signed)
Progress Note  Patient Name: Dan Stone Date of Encounter: 12/08/2017  Primary Cardiologist: No primary care provider on file. New- seen by Dr Wyline Mood Electrophysiology: Dr Graciela Husbands  Subjective   Patient feels well. Rambling history. Denies dyspnea, chest pain, dizziness.  Inpatient Medications    Scheduled Meds: . aspirin  81 mg Oral Daily  . atorvastatin  40 mg Oral q1800  . clopidogrel  75 mg Oral Daily  . gabapentin  100 mg Oral Daily  . QUEtiapine  50 mg Oral QHS  . sertraline  25 mg Oral QHS  . traZODone  50 mg Oral QHS   Continuous Infusions: . sodium chloride 10 mL/hr at 12/07/17 1000  . ampicillin-sulbactam (UNASYN) IV 1.5 g (12/08/17 0514)   PRN Meds: acetaminophen **OR** acetaminophen, hydrALAZINE, ondansetron **OR** ondansetron (ZOFRAN) IV   Vital Signs    Vitals:   12/07/17 0456 12/07/17 1311 12/07/17 2049 12/08/17 0600  BP: (!) 146/98 136/83 119/66 (!) 152/85  Pulse: 80 91 64 (!) 56  Resp: 16 17 18 16   Temp: 98.9 F (37.2 C) 98 F (36.7 C) 97.7 F (36.5 C) 98.5 F (36.9 C)  TempSrc: Oral  Oral Oral  SpO2: 98% 100% 99% 98%  Weight:      Height:        Intake/Output Summary (Last 24 hours) at 12/08/2017 0915 Last data filed at 12/08/2017 0610 Gross per 24 hour  Intake -  Output 250 ml  Net -250 ml   Filed Weights   12/04/17 2141  Weight: 250 lb (113.4 kg)    Telemetry    Wandering atrial pacemaker. No MAT.  No Afib. - Personally Reviewed  ECG    None today - Personally Reviewed  Physical Exam   GEN: No acute distress.   Neck: No JVD Cardiac: IRRR, harsh gr 3/6 systolic murmur right and left USB. No diastolic murmur. Respiratory: Clear to auscultation bilaterally. GI: Soft, nontender, non-distended  MS: No edema; No deformity. Neuro:  Nonfocal  Psych: Normal affect   Labs    Chemistry Recent Labs  Lab 12/04/17 2321 12/05/17 0454 12/06/17 0617 12/07/17 0705 12/08/17 0624  NA 139 142 140 141 143  K 3.8 3.8 3.6 3.3* 3.4*    CL 107 106 106 104 110  CO2 25 26 26 25 26   GLUCOSE 111* 92 96 104* 100*  BUN 7* 9 9 7* 7*  CREATININE 1.04 1.06 1.10 0.96 0.93  CALCIUM 8.4* 8.5* 8.7* 8.8* 8.4*  PROT 6.0* 5.9*  --   --   --   ALBUMIN 2.9* 2.8*  --   --   --   AST 19 20  --   --   --   ALT 13 13  --   --   --   ALKPHOS 78 75  --   --   --   BILITOT 1.4* 1.6*  --   --   --   GFRNONAA >60 >60 >60 >60 >60  GFRAA >60 >60 >60 >60 >60  ANIONGAP 7 10 8 12 7      Hematology Recent Labs  Lab 12/05/17 0454 12/07/17 0705 12/08/17 0624  WBC 7.9 5.2 4.1  RBC 4.33 4.51 4.19*  HGB 12.4* 13.0 11.9*  HCT 39.0 40.0 37.8*  MCV 90.1 88.7 90.2  MCH 28.6 28.8 28.4  MCHC 31.8 32.5 31.5  RDW 14.1 13.7 14.3  PLT 177 176 175    Cardiac Enzymes Recent Labs  Lab 12/05/17 0454 12/05/17 1034 12/05/17 1910  TROPONINI <  0.03 <0.03 <0.03    Recent Labs  Lab 12/04/17 2325 12/05/17 0102  TROPIPOC 0.00 0.01     BNP Recent Labs  Lab 12/04/17 2321  BNP 172.2*     DDimer No results for input(s): DDIMER in the last 168 hours.   Radiology    Mr Maxine GlennMra Head Wo Contrast  Result Date: 12/08/2017 CLINICAL DATA:  Follow-up examination for acute stroke. EXAM: MRA HEAD WITHOUT CONTRAST TECHNIQUE: Angiographic images of the Circle of Willis were obtained using MRA technique without intravenous contrast. COMPARISON:  Prior MRI from 12/05/2017. FINDINGS: ANTERIOR CIRCULATION: Distal cervical segments of the internal carotid arteries are patent with antegrade flow. Petrous segments patent bilaterally. Scattered atheromatous irregularity within the cavernous ICAs without hemodynamically significant stenosis. ICA termini patent. Left A1 patent. Right A1 hypoplastic and/or absent. Normal anterior communicating artery. Anterior cerebral arteries demonstrate mild atheromatous irregularity but are patent to their distal aspects without high-grade stenosis. Mild atheromatous change within the M1 segments without significant stenosis. Normal MCA  bifurcations. Distal MCA branches well perfused and symmetric. POSTERIOR CIRCULATION: Left vertebral artery patent to the vertebrobasilar junction without high-grade stenosis. Patent left PICA. Right vertebral artery not visualize, likely occluded. Right PICA not visualized. Basilar demonstrates scattered atheromatous irregularity without high-grade stenosis. Superior cerebral arteries patent bilaterally. Both of the posterior cerebral arteries primarily supplied via the basilar. Atheromatous irregularity throughout the PCAs bilaterally without significant stenosis. PCAs are patent to their distal aspects. No intracranial aneurysm. IMPRESSION: 1. Occluded right vertebral artery. Otherwise patent vertebrobasilar system. 2. Moderate atheromatous irregularity throughout the intracranial circulation without proximal high-grade or correctable stenosis. Electronically Signed   By: Rise MuBenjamin  McClintock M.D.   On: 12/08/2017 03:52    Cardiac Studies   Echo 12/05/17: Study Conclusions  - Left ventricle: The cavity size was normal. Wall thickness was   increased in a pattern of mild LVH. Systolic function was normal.   The estimated ejection fraction was in the range of 60% to 65%.   Wall motion was normal; there were no regional wall motion   abnormalities. Doppler parameters are consistent with abnormal   left ventricular relaxation (grade 1 diastolic dysfunction). - Aortic valve: Mildly calcified annulus. Trileaflet; mildly   thickened, moderately calcified leaflets. There was severe   stenosis. Mean gradient (S): 31 mm Hg. VTI ratio of LVOT to   aortic valve: 0.2. Valve area (VTI): 0.69 cm^2. Valve area   (Vmean): 0.68 cm^2. - Mitral valve: Mildly calcified annulus. There was trivial   regurgitation. - Right atrium: Central venous pressure (est): 3 mm Hg. - Atrial septum: No defect or patent foramen ovale was identified. - Tricuspid valve: There was mild regurgitation. - Pulmonary arteries: PA peak  pressure: 27 mm Hg (S). - Pericardium, extracardiac: There was no pericardial effusion.  Patient Profile     84 y.o. male history of HTN, CVA, dementia admitted with witnessed syncope.     Assessment & Plan    1. Syncope - seen by EP. Felt to be related to hypotension. ? In setting of PNA +/- vagal mediated. No significant bradycardia noted on monitor here. No indication for pacemaker. 2. Aortic stenosis. I personally reviewed Echo and valve is in the moderate to severe range but not critical. Patient is a poor candidate for AVR at this point due to co-morbidities including dementia. I don't think we need additional evaluation of this at this time since treatment options are non existent even if it is severe. We will get more assessment with TEE  tomorrow. Otherwise would repeat TTE in 6 months 3. Acute visual loss right eye. Due to central retinal artery occlusion. No significant carotid disease. Neuro feels this is likely cardioembolic.  Will proceed with TEE this admission for further embolic work up.  It seems an  ILR would be indicated to assess need for anticoagulation. Will discuss with EP. Tentatively scheduled for TEE tomorrow depending on schedule. Will keep NPO.  4. Wandering atrial pacemaker/MAT. No Afib seen on telemetry.   5. PNA per primary team.  For questions or updates, please contact [facility] Please consult [email or web address] for contact info under Cardiology/STEMI.      Signed, Peter [place], MD  12/08/2017, 9:15 AM

## 2017-12-09 DIAGNOSIS — J181 Lobar pneumonia, unspecified organism: Secondary | ICD-10-CM

## 2017-12-09 DIAGNOSIS — D62 Acute posthemorrhagic anemia: Secondary | ICD-10-CM

## 2017-12-09 DIAGNOSIS — R410 Disorientation, unspecified: Secondary | ICD-10-CM

## 2017-12-09 DIAGNOSIS — I350 Nonrheumatic aortic (valve) stenosis: Secondary | ICD-10-CM

## 2017-12-09 DIAGNOSIS — I10 Essential (primary) hypertension: Secondary | ICD-10-CM

## 2017-12-09 DIAGNOSIS — F0391 Unspecified dementia with behavioral disturbance: Secondary | ICD-10-CM

## 2017-12-09 DIAGNOSIS — I6930 Unspecified sequelae of cerebral infarction: Secondary | ICD-10-CM

## 2017-12-09 DIAGNOSIS — H348111 Central retinal vein occlusion, right eye, with retinal neovascularization: Secondary | ICD-10-CM

## 2017-12-09 DIAGNOSIS — E876 Hypokalemia: Secondary | ICD-10-CM

## 2017-12-09 DIAGNOSIS — H349 Unspecified retinal vascular occlusion: Secondary | ICD-10-CM

## 2017-12-09 LAB — BASIC METABOLIC PANEL
Anion gap: 7 (ref 5–15)
BUN: 9 mg/dL (ref 8–23)
CHLORIDE: 108 mmol/L (ref 98–111)
CO2: 28 mmol/L (ref 22–32)
CREATININE: 0.98 mg/dL (ref 0.61–1.24)
Calcium: 8.4 mg/dL — ABNORMAL LOW (ref 8.9–10.3)
GFR calc Af Amer: >60 mL/min (ref >60)
GFR calc non Af Amer: >60 mL/min (ref >60)
GLUCOSE: 91 mg/dL (ref 70–99)
POTASSIUM: 3.1 mmol/L — AB (ref 3.5–5.1)
Sodium: 143 mmol/L (ref 135–145)

## 2017-12-09 LAB — CBC
HEMATOCRIT: 37.6 % — AB (ref 39.0–52.0)
Hemoglobin: 12 g/dL — ABNORMAL LOW (ref 13.0–17.0)
MCH: 28.9 pg (ref 26.0–34.0)
MCHC: 31.9 g/dL (ref 30.0–36.0)
MCV: 90.6 fL (ref 78.0–100.0)
Platelets: 174 10*3/uL (ref 150–400)
RBC: 4.15 MIL/uL — ABNORMAL LOW (ref 4.22–5.81)
RDW: 14.3 % (ref 11.5–15.5)
WBC: 4.7 10*3/uL (ref 4.0–10.5)

## 2017-12-09 MED ORDER — ATORVASTATIN CALCIUM 40 MG PO TABS: 40.0000 mg | ORAL_TABLET | Freq: Every day | ORAL | 3 refills | Status: DC

## 2017-12-09 MED ORDER — AMOXICILLIN-POT CLAVULANATE 875-125 MG PO TABS: 1.0000 | ORAL_TABLET | Freq: Two times a day (BID) | ORAL | 0 refills | Status: DC

## 2017-12-09 MED ORDER — ACETAMINOPHEN 325 MG PO TABS: 650.0000 mg | ORAL_TABLET | Freq: Four times a day (QID) | ORAL | 0 refills | Status: DC | PRN

## 2017-12-09 MED ORDER — ASPIRIN EC 81 MG PO TBEC: 81.0000 mg | DELAYED_RELEASE_TABLET | Freq: Every day | ORAL | 2 refills | Status: DC

## 2017-12-09 MED ORDER — POTASSIUM CHLORIDE CRYS ER 20 MEQ PO TBCR
40.0000 meq | EXTENDED_RELEASE_TABLET | Freq: Once | ORAL | Status: AC
  Administered 2017-12-09: 40 meq via ORAL
  Filled 2017-12-09: qty 2

## 2017-12-09 NOTE — [place] FL2 (Signed)
[place] MEDICAID FL2 LEVEL OF CARE SCREENING TOOL     IDENTIFICATION  Patient Name: Dan GhaziJames Seltzer Birthdate: 1933-07-20 Sex: male Admission Date (Current Location): 12/04/2017  [facility] [facility] [place]   Facility and Address:  [facility], [address], [place], [place] [address]      Provider Number: [id number]  Attending Physician Name and Address:  Alberteen Samanford, Christopher P, *  Relative Name and Phone Number:  Daphene JaegerRonita, daughter, [phone]    Current Level of Care: Hospital Recommended Level of Care: Skilled Nursing Facility Prior Approval Number:    Date Approved/Denied:   PASRR Number: [id number]  Discharge Plan: SNF    Current Diagnoses: Patient Active Problem List   Diagnosis Date Noted  . Nonrheumatic aortic valve stenosis   . Retinal artery occlusion   . Syncope and collapse 12/05/2017  . Essential hypertension 12/05/2017  . Aspiration into respiratory tract 12/05/2017  . Dementia 12/05/2017  . Syncope 12/05/2017  . Community acquired pneumonia of right lower lobe of lung (HCC) 12/05/2017    Orientation RESPIRATION BLADDER Height & Weight     Self  Normal Incontinent, External catheter Weight: 113.4 kg (250 lb) Height:  5\' 9"  (175.3 cm)  BEHAVIORAL SYMPTOMS/MOOD NEUROLOGICAL BOWEL NUTRITION STATUS  (Dementia)   Continent Diet(Please see DC Summary)  AMBULATORY STATUS COMMUNICATION OF NEEDS Skin   Extensive Assist Verbally Normal                       Personal Care Assistance Level of Assistance  Bathing, Feeding, Dressing Bathing Assistance: Maximum assistance Feeding assistance: Limited assistance Dressing Assistance: Limited assistance     Functional Limitations Info  Sight, Hearing, Speech Sight Info: Adequate Hearing Info: Adequate Speech Info: Adequate    SPECIAL CARE FACTORS FREQUENCY  PT (By licensed PT), OT (By licensed OT)     PT Frequency: 5x/week OT Frequency: 3x/week             Contractures      Additional Factors Info  Code Status, Allergies, Psychotropic Code Status Info: Full Allergies Info: NKA Psychotropic Info: Seroquel; Zoloft;Trazadone         Current Medications (12/09/2017):  This is the current hospital active medication list Current Facility-Administered Medications  Medication Dose Route Frequency Provider Last Rate Last Dose  . 0.9 %  sodium chloride infusion   Intravenous Continuous Danford, Earl Liteshristopher P, MD 10 mL/hr at 12/07/17 1000    . acetaminophen (TYLENOL) tablet 650 mg  650 mg Oral Q6H PRN Eduard ClosKakrakandy, Arshad N, MD       Or  . acetaminophen (TYLENOL) suppository 650 mg  650 mg Rectal Q6H PRN Eduard ClosKakrakandy, Arshad N, MD      . ampicillin-sulbactam (UNASYN) 1.5 g in sodium chloride 0.9 % 100 mL IVPB  1.5 g Intravenous Q6H Danford, Earl Liteshristopher P, MD 200 mL/hr at 12/09/17 1019 1.5 g at 12/09/17 1019  . aspirin chewable tablet 81 mg  81 mg Oral Daily Caryl PinaLindzen, Eric, MD   81 mg at 12/09/17 1006  . atorvastatin (LIPITOR) tablet 40 mg  40 mg Oral q1800 Alberteen Samanford, Christopher P, MD   40 mg at 12/08/17 1800  . clopidogrel (PLAVIX) tablet 75 mg  75 mg Oral Daily Eduard ClosKakrakandy, Arshad N, MD   75 mg at 12/09/17 1007  . gabapentin (NEURONTIN) capsule 100 mg  100 mg Oral Daily Eduard ClosKakrakandy, Arshad N, MD   100 mg at 12/09/17 1007  . hydrALAZINE (APRESOLINE) injection 5 mg  5 mg Intravenous Q4H PRN Eduard Clos, MD      . ondansetron [facility]) tablet 4 mg  4 mg Oral Q6H PRN Eduard Clos, MD       Or  . ondansetron [facility]) injection 4 mg  4 mg Intravenous Q6H PRN Eduard Clos, MD      . QUEtiapine (SEROQUEL) tablet 25 mg  25 mg Oral QHS [name], MD   25 mg at 12/08/17 2309  . sertraline (ZOLOFT) tablet 25 mg  25 mg Oral QHS Eduard Clos, MD   25 mg at 12/07/17 2209  . traZODone (DESYREL) tablet 50 mg  50 mg Oral QHS Eduard Clos, MD   50 mg at 12/07/17 2209     Discharge Medications: Please see discharge  summary for a list of discharge medications.  Relevant Imaging Results:  Relevant Lab Results:   Additional Information SSN: [id number] [place], [place]

## 2017-12-09 NOTE — Progress Notes (Signed)
Patient had 2.20 sec pause reported from tele. MD notified and aware. Cardiology has seen patient.

## 2017-12-09 NOTE — Progress Notes (Addendum)
Patient's daughter aware that patient is in copay days at SNF (was at [facility] [place] from 5/21-6/21) and that there will be a copay of $171/day with a payment plan.  CSW alerted patient's daughter that [facility] is unable to accept him. She requests [facility] as her first preference. [facility] able to accept patient today.  Osborne Cascoadia Katharyn Schauer LCSW [phone]

## 2017-12-09 NOTE — Clinical Social Work Note (Signed)
Clinical Social Work Assessment  Patient Details  Name: Dan GhaziJames Dacy MRN: [id number] Date of Birth: [birth year]  Date of referral:  12/09/17               Reason for consult:  Facility Placement                Permission sought to share information with:  Facility Medical sales representativeContact Representative, Family Supports Permission granted to share information::  No  Name::     Lobbyistonita  Agency::  SNFs  Relationship::  Daughter  Contact Information:  [phone]  Housing/Transportation Living arrangements for the past 2 months:  Single Family Home Source of Information:  Adult Children Patient Interpreter Needed:  None Criminal Activity/Legal Involvement Pertinent to Current Situation/Hospitalization:  No - Comment as needed Significant Relationships:  Adult Children Lives with:  Adult Children Do you feel safe going back to the place where you live?  No Need for family participation in patient care:  Yes (Comment)  Care giving concerns:  CSW received consult for possible SNF placement at time of discharge if [facility] is unable to accept patient. CSW spoke with patient's daughter regarding PT recommendation of SNF placement at time of discharge. Patient's daughter reported that she is currently unable to care for patient at their home given patient's current physical needs and fall risk. Patient's daughter expressed understanding of PT recommendation and is agreeable to SNF placement at time of discharge. CSW to continue to follow and assist with discharge planning needs.   Social Worker assessment / plan:  CSW spoke with patient's daughter concerning [facility] before [facility].  Employment status:  Retired Health and safety inspectornsurance information:  [employer]are PT Recommendations:  Inpatient Rehab Consult Information / Referral to community resources:  Skilled Nursing Facility  Patient/Family's Response to care:  Patient's daughter recognizes need for rehab before returning home and is agreeable to [place] [facility] [place] unable to accept patient, though that is their first preference. CSW provided SNF list.  Patient/Family's Understanding of and Emotional Response to Diagnosis, Current Treatment, and Prognosis:  Patient/family is realistic regarding therapy needs and expressed being hopeful for SNF placement. Patient's duaghter expressed understanding of CSW role and discharge process as well as medical condition. No questions/concerns about plan or treatment.    Emotional Assessment Appearance:  Appears stated age Attitude/Demeanor/Rapport:  Unable to Assess Affect (typically observed):  Unable to Assess Orientation:  Oriented to Self Alcohol / Substance use:  Not Applicable Psych involvement (Current and /or in the community):  No (Comment)  Discharge Needs  Concerns to be addressed:  Care Coordination Readmission within the last 30 days:  No Current discharge risk:  Cognitively Impaired Barriers to Discharge:  Continued Medical Work up   [employer] S Fumio Vandam, LCSWA 12/09/2017, 11:15 AM

## 2017-12-09 NOTE — Consult Note (Signed)
Physical Medicine and Rehabilitation Consult   Reason for Consult: Debility Referring Physician: Dr. Maryfrances Bunnell.    HPI: Dan Stone is a 84 y.o. male with history of HTN, glaucoma, CVA with residual left sided weakness, mild dementia who was admitted  due to decrease in LOC with bradycardia and 3 day history of decreased vision in right eye.  History taken from chart review and sitter.  Patient with recent move to [place] for rehab due to back pain. He was started on IV antibiotics due to aspiration PNA and syncope felt to be due to orthostatic symptoms from dehydration and PNA.  MRI brain reviewed, unremarkable for acute intracranial process.  Dr. Shah/opthalomogy felt that patient with CRAO and stroke work up initiated.   2 D echo revealed EF 60-65% with severe AS. MAT noted and TEE ordered for work up of embolic stroke but patient's ophthalmologist confirmed that patient with chronic visual loss on right. TEE deferred as patient not felt to be candidate for TVAR.    Review of Systems  Unable to perform ROS: Dementia    Past Medical History:  Diagnosis Date  . Hypertension   . Stroke [facility])    5 years ago    Past Surgical History:  Procedure Laterality Date  . EYE SURGERY      Family History  Problem Relation Age of Onset  . CAD Father     Social History:  Married? Per reports that he has never smoked. He has never used smokeless tobacco. Per reports that he does not drink alcohol or use drugs.    Allergies: No Known Allergies    Medications Prior to Admission  Medication Sig Dispense Refill  . amLODipine (NORVASC) 10 MG tablet Take 5 mg by mouth at bedtime.     . brimonidine (ALPHAGAN) 0.2 % ophthalmic solution Place 1 drop into both eyes 2 (two) times daily.    . clopidogrel (PLAVIX) 75 MG tablet Take 75 mg by mouth daily.    Marland Kitchen gabapentin (NEURONTIN) 100 MG capsule Take 100 mg by mouth daily.    . memantine (NAMENDA) 10 MG tablet Take 5 mg by mouth 2 (two) times  daily.    . risperiDONE (RISPERDAL) 1 MG tablet Take 1.5 mg by mouth 2 (two) times daily.     . sertraline (ZOLOFT) 25 MG tablet Take 25 mg by mouth at bedtime.    . timolol (BETIMOL) 0.5 % ophthalmic solution Place 1 drop into both eyes 2 (two) times daily.    . traZODone (DESYREL) 50 MG tablet Take 50 mg by mouth at bedtime.      Home: Home Living Family/patient expects to be discharged to:: Private residence(dtr) Living Arrangements: Children Type of Home: House Home Access: Stairs to enter Home Layout: Two level(plans to install ramp) Alternate Level Stairs-Number of Steps: 3 Home Equipment: Walker - 2 wheels, Walker - 4 wheels  Functional History: Prior Function Level of Independence: Needs assistance Gait / Transfers Assistance Needed: walks with Rw or no AD ADL's / Homemaking Assistance Needed: son assists with transfers Functional Status:  Mobility: Bed Mobility Overal bed mobility: Needs Assistance Bed Mobility: Supine to Sit Supine to sit: Max assist, +2 for physical assistance Sit to supine: Mod assist General bed mobility comments: increased time, pt intitiated bringing LEs off EOB however was unable to sequence bringing trunk up to sit EOB. once pt sitting EOb safely pt able to maintain Transfers Overall transfer level: Needs assistance Equipment used: Rolling walker (  2 wheeled) Transfers: Sit to/from Stand, [employer] CorporationStand Pivot Transfers Sit to Stand: Mod assist, Max assist, +2 physical assistance Stand pivot transfers: +2 physical assistance, Max assist, Mod assist(2 person transfer with gait belt, no RW) General transfer comment: pt initiated powering up but required modAx2 to achieve full standing, completed 3 stands and a stand pivot, daughter provided tactile cues to buttocks to facilitate stepping to chair as patient wasn't following commands or initiating. Pt states "my legs are tired" Ambulation/Gait Ambulation/Gait assistance: Min assist Gait Distance (Feet): 20  Feet Assistive device: Rolling walker (2 wheeled) Gait Pattern/deviations: Step-to pattern, Step-through pattern, Drifts right/left General Gait Details: unable to today    ADL:    Cognition: Cognition Overall Cognitive Status: History of cognitive impairments - at baseline Orientation Level: Oriented to person, Disoriented to place, Disoriented to time, Disoriented to situation Cognition Arousal/Alertness: Awake/alert(initially lethargic but easily awoken) Behavior During Therapy: WFL for tasks assessed/performed Overall Cognitive Status: History of cognitive impairments - at baseline General Comments: pt with dementia, daughter reports it to be worse at night. at night is when "he gets all hyper". pt did follow all commands and participate in therapy. stated name and birthdate.  Blood pressure 122/69, pulse 71, temperature 98.1 F (36.7 C), temperature source Oral, resp. rate 16, height 5\' 9"  (1.753 m), weight 113.4 kg (250 lb), SpO2 100 %. Physical Exam  Nursing note and vitals reviewed. Constitutional: He appears well-developed.  Bilateral mittens in place.  Sitter in place for safety Obese  HENT:  Head: Normocephalic and atraumatic.  Eyes: Right eye exhibits no discharge. Left eye exhibits no discharge. No scleral icterus.  Neck: Normal range of motion. Neck supple.  Cardiovascular: Normal rate and regular rhythm.  Respiratory: Effort normal and breath sounds normal.  GI: Soft. Bowel sounds are normal.  Musculoskeletal:  No edema or tenderness in extremities  Neurological:  Extremely lethargic Not participating in exam, seen moving b/l UE  Skin: Skin is warm and dry.  Psychiatric:  Unable to assess due to lethargy    Results for orders placed or performed during the hospital encounter of 12/04/17 (from the past 24 hour(s))  Basic metabolic panel     Status: Abnormal   Collection Time: 12/09/17  5:20 AM  Result Value Ref Range   Sodium 143 135 - 145 mmol/L    Potassium 3.1 (L) 3.5 - 5.1 mmol/L   Chloride 108 98 - 111 mmol/L   CO2 28 22 - 32 mmol/L   Glucose, Bld 91 70 - 99 mg/dL   BUN 9 8 - 23 mg/dL   Creatinine, Ser 7.250.98 0.61 - 1.24 mg/dL   Calcium 8.4 (L) 8.9 - 10.3 mg/dL   GFR calc non Af Amer >60 >60 mL/min   GFR calc Af Amer >60 >60 mL/min   Anion gap 7 5 - 15  CBC     Status: Abnormal   Collection Time: 12/09/17  5:20 AM  Result Value Ref Range   WBC 4.7 4.0 - 10.5 K/uL   RBC 4.15 (L) 4.22 - 5.81 MIL/uL   Hemoglobin 12.0 (L) 13.0 - 17.0 g/dL   HCT 36.637.6 (L) 44.039.0 - 34.752.0 %   MCV 90.6 78.0 - 100.0 fL   MCH 28.9 26.0 - 34.0 pg   MCHC 31.9 30.0 - 36.0 g/dL   RDW 42.514.3 95.611.5 - 38.715.5 %   Platelets 174 150 - 400 K/uL   [facility] Wo Contrast  Result Date: 12/08/2017 CLINICAL DATA:  Follow-up examination for acute  stroke. EXAM: MRA HEAD WITHOUT CONTRAST TECHNIQUE: Angiographic images of the Circle of Willis were obtained using MRA technique without intravenous contrast. COMPARISON:  Prior MRI from 12/05/2017. FINDINGS: ANTERIOR CIRCULATION: Distal cervical segments of the internal carotid arteries are patent with antegrade flow. Petrous segments patent bilaterally. Scattered atheromatous irregularity within the cavernous ICAs without hemodynamically significant stenosis. ICA termini patent. Left A1 patent. Right A1 hypoplastic and/or absent. Normal anterior communicating artery. Anterior cerebral arteries demonstrate mild atheromatous irregularity but are patent to their distal aspects without high-grade stenosis. Mild atheromatous change within the M1 segments without significant stenosis. Normal MCA bifurcations. Distal MCA branches well perfused and symmetric. POSTERIOR CIRCULATION: Left vertebral artery patent to the vertebrobasilar junction without high-grade stenosis. Patent left PICA. Right vertebral artery not visualize, likely occluded. Right PICA not visualized. Basilar demonstrates scattered atheromatous irregularity without high-grade  stenosis. Superior cerebral arteries patent bilaterally. Both of the posterior cerebral arteries primarily supplied via the basilar. Atheromatous irregularity throughout the PCAs bilaterally without significant stenosis. PCAs are patent to their distal aspects. No intracranial aneurysm. IMPRESSION: 1. Occluded right vertebral artery. Otherwise patent vertebrobasilar system. 2. Moderate atheromatous irregularity throughout the intracranial circulation without proximal high-grade or correctable stenosis. Electronically Signed   By: Rise Mu M.D.   On: 12/08/2017 03:52    Assessment/Plan: Diagnosis: Debility Labs and images (see above) independently reviewed.  Records reviewed and summated above.  1. Does the need for close, 24 hr/day medical supervision in concert with the patient's rehab needs make it unreasonable for this patient to be served in a less intensive setting? Potentially  2. Co-Morbidities requiring supervision/potential complications: HTN (monitor and provide prns in accordance with increased physical exertion and pain), glaucoma, CVA with residual left sided weakness, dementia, bradycardia (monitor HR with increased activity), severe AS (Monitor in accordance with increased physical activity and avoid UE resistance excercises), hypokalemia (continue to monitor and replete as necessary), ABLA (transfuse if necessary to ensure appropriate perfusion for increased activity tolerance) 3. Due to bladder management, bowel management, safety, disease management, medication administration and patient education, does the patient require 24 hr/day rehab nursing? Yes 4. Does the patient require coordinated care of a physician, rehab nurse, PT (1-2 hrs/day, 5 days/week), OT (1-2 hrs/day, 5 days/week) and SLP (1-2 hrs/day, 5 days/week) to address physical and functional deficits in the context of the above medical diagnosis(es)? Yes Addressing deficits in the following areas: balance,  endurance, locomotion, strength, transferring, bathing, dressing, toileting, cognition and psychosocial support 5. Can the patient actively participate in an intensive therapy program of at least 3 hrs of therapy per day at least 5 days per week? Potentially 6. The potential for patient to make measurable gains while on inpatient rehab is excellent 7. Anticipated functional outcomes upon discharge from inpatient rehab are mod assist  with PT, mod assist with OT, mod assist and max assist with SLP. 8. Estimated rehab length of stay to reach the above functional goals is: 20-25 days. 9. Anticipated D/C setting: TBD. 10. Anticipated post D/C treatments: HH therapy and Home excercise program 11. Overall Rehab/Functional Prognosis: good and fair  RECOMMENDATIONS: This patient's condition is appropriate for continued rehabilitative care in the following setting: Recommend Palliative care consult.  Pt with limited carry over.  Will discuss with family goals of care.  At present, patient will benefit more from less intense rehab.  Patient has agreed to participate in recommended program. Potentially Note that insurance prior authorization may be required for reimbursement for recommended care.  Comment:  Rehab Admissions Coordinator to follow up.   I have personally performed a face to face diagnostic evaluation, including, but not limited to relevant history and physical exam findings, of this patient and developed relevant assessment and plan.  Additionally, I have reviewed and concur with the physician assistant's documentation above.   Maryla Morrow, MD, ABPMR Jacquelynn Cree, PA-C 12/09/2017

## 2017-12-09 NOTE — Progress Notes (Signed)
  Speech Language Pathology Treatment: Dysphagia  Patient Details Name: Dan Stone MRN: [id number] DOB: 1933/08/04 Today's Date: 12/09/2017 Time: 6629-4765 SLP Time Calculation (min) (ACUTE ONLY): 11 min  Assessment / Plan / Recommendation Clinical Impression  Pt is drowsy this morning but will respond to verbal/tactile stimulation, open his eyes to command. He requests PO intake. SLP provided skilled observation and Min cues for appropriate bolus size, adequate oral clearance when given regular textures, thin liquids, and pills whole with water. No overt signs of aspiration are noted, although his risk is elevated due to mentation, making it important to have assistance with PO intake and to ensure he is fully alert. Otherwise, additional SLP f/u not necessary at this time. Would continue with regular diet and thin liquids with precautions listed below.   HPI HPI: Pt is an 84 y.o. male with history of hypertension, stroke, dementia who was brought to the ER after patient had a syncopal episode witnessed by patient's daughter and associated with light-headedness, nausea, and vomiting. Pt has also had a 3 day h/o poor vision in his R eye. MRI negative for acute findings. CXR concerning for possible aspiration, therefore swallow evaluation was ordered.      SLP Plan  All goals met       Recommendations  Diet recommendations: Regular;Thin liquid Liquids provided via: Cup;Straw Medication Administration: Whole meds with liquid Supervision: Staff to assist with self feeding;Full supervision/cueing for compensatory strategies Compensations: Minimize environmental distractions;Slow rate;Small sips/bites;Lingual sweep for clearance of pocketing Postural Changes and/or Swallow Maneuvers: Seated upright 90 degrees                Oral Care Recommendations: Oral care BID Follow up Recommendations: 24 hour supervision/assistance SLP Visit Diagnosis: Dysphagia, oral phase (R13.11) Plan: All  goals met       GO                Dan Stone 12/09/2017, 10:26 AM   Dan Stone, M.A. CCC-SLP [phone]

## 2017-12-09 NOTE — Clinical Social Work Placement (Signed)
   CLINICAL SOCIAL WORK PLACEMENT  NOTE  Date:  12/09/2017  Patient Details  Name: Dan GhaziJames Hefley MRN: [id number] Date of Birth: [birth year]  Clinical Social Work is seeking post-discharge placement for this patient at the Skilled  Nursing Facility level of care (*CSW will initial, date and re-position this form in  chart as items are completed):  Yes   Patient/family provided with [place] Clinical Social Work Department's list of facilities offering this level of care within the geographic area requested by the patient (or if unable, by the patient's family).  Yes   Patient/family informed of their freedom to choose among providers that offer the needed level of care, that participate in Medicare, Medicaid or managed care program needed by the patient, have an available bed and are willing to accept the patient.  Yes   Patient/family informed of [facility]'s ownership interest in [facility] and [facility], as well as of the fact that they are under no obligation to receive care at these facilities.  PASRR submitted to EDS on       PASRR number received on       Existing PASRR number confirmed on 12/09/17     FL2 transmitted to all facilities in geographic area requested by pt/family on 12/09/17     FL2 transmitted to all facilities within larger geographic area on       Patient informed that his/her managed care company has contracts with or will negotiate with certain facilities, including the following:        Yes   Patient/family informed of bed offers received.  [facility]     Physician recommends and patient chooses bed at      [facility] 12[facility].  Patient to be transferred to facility by [facility]     Patient family notified on 12/09/17 of transfer.  Name of family member notified:  Daphene JaegerRonita, daughter     PHYSICIAN Please prepare priority discharge summary, including  medications, Please sign FL2     Additional Comment:    _______________________________________________ Mearl LatinNadia S Morena Mckissack, LCSWA 12/09/2017, 2:42 PM

## 2017-12-09 NOTE — Discharge Summary (Signed)
Physician Discharge Summary  Dan Stone XNA:[id number] DOB: [birth year] DOA: 12/04/2017  PCP: Sandrea Hammond, MD  Admit date: 12/04/2017 Discharge date: 12/09/2017  Admitted From: Home  Disposition:  [facility]   Recommendations for Outpatient Follow-up:  1. Please obtain new PCP and follow up in next month if able 2. Please follow up with Cardiology as directed 3. Please follow up with Dr. 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  Found in ER to have pneumonia on CXR.  Also prolonged hypotension, without fever or white count.  Suspect vagal outflow from pneumonia, leading to syncope.  Initially there was an incidental finding of painless right eye blindness.  There was initial information obtained that this was sudden and NEW.  Work up for CRAO was begun.  Subsequently, family from [place] reported blindness was old.  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            Discharge Diagnoses:     Syncope Patient had some initial intermittent bradycardia and Electrophysiology were consulted.  Given prolonged hypotension after event, this was thought to be certainly vagally mediated.  No indication for pacemaker.  Patient monitored on telemetry during his stay, no symptomatic arrhythmia.      Community acquired pneumonia of right lower lobe Right lower lobar pneumonia, possibly aspiration related, given history of vomiting (although this history/timing of vomiting/presence of choking varies with each telling).  SLP evaluated, no dietary modifications  recommended, swallowing appeared normal.  Blood cultures negative.  Strep pneumo negative.  No WBC or fever, but Procalcitonin elevated.   Treated with Unasyn, discharged to finish 7 days with Augmentin.   Glaucoma Central retinal VEIN occlusion, chronic Right eye blindness, chronic  Initial concern for sudden painless monocular vision loss.  ESR low.  Initial concern for CRAO, Ophthalmology and Neurology consulted.    Additional history from family in [place] contradicted earlier testimony at admission that blindness was new.  Discussed with patient's former ophthalmologist in [place], Dr. Jonna Coup, who CONFIRMS patient has CHRONIC blindness in right eye, from CRVO and has had no light perception in right eye for some time.   -No further work up for embolic event necessary, follow up with Ophthalmology as needed  Multifocal atrial tachycardia Wandering pacemaker Noted to have wandering pacemaker in hospital.  Atrial fibrillation not present. -If repeat syncopal event, referral to EP would be reasonable  Moderate Aortic stenosis Echo with normal EF but AV area 0.7, although relatively low gradient.  BNP normal and no symptoms of heart failure.  Patient evaluated by Cardiology, agree that conservative management, repeat transthoracic echo in 6 months, and outpatient Cardiology follow up were reasonable, especially given his advanced dementia, relatively sedentary functional status.     Delirium Dementia with behavioral disturbance Delirium persistent in hospital.  Significant sundowning.  Given intermittent bradycardias, Donepezil and to a lesser extent Namenda are contraindicated as causing bradycardias.  He should continue his risperidone, trazodone at night, and sertraline.   Hypertension Secondary prevention of CVA Hyperlipidemia Resumed amlodipine, Plavix in hospital.  Started new statin given LDL 180.  Also added aspirin 81 mg daily to Plavix given  extensive cerebral  atherosclerosis on MRA head.        Discharge Instructions  Discharge Instructions    Diet - low sodium heart healthy   Complete by:  As directed    Discharge instructions   Complete by:  As directed    From Dr. Loleta Books: Your father was admitted for a passing out spell, which we call "syncope".   We found while he was here that he had a pneumonia. In his case, the syncope was caused by a "simple faint" or "vasovagal episode", which is usually provoked by something (heat, hunger, fear, stress, or infection). In his case, we suspect it was from his underlying infection.  There is always the fear that passing out/syncope is from a weak or faulty heart, which is why we asked the Cardiologists and Electrophysiologists to evaluate your father.  Thankfully, all of their investigations showed nothing new other than his known aortic stenosis (which causes the murmur), and no indication for a pacemaker or valve replacement at this time.    Your father's medical conditions are as follows: History of stroke Hypertension (high blood pressure) Dementia (his previous doctors may know what type, but we should presume vascular or Alzheimer's dementia) Glaucoma Right eye blindness from central retinal VEIN occlusion (a common complication of glaucoma and high blood pressure) Aortic stenosis (this means, thickening of the aortic valve; it causes a murmur)   For the history of stroke: Our neurologist here recommends a cholesterol medicine, atorvastatin 40 mg nightly Have his new PCP recheck a cholesterol level in 3 months Also, continue Plavix, but add baby aspirin 81 mg.   For the heart valve: Nothing needs to be done about this now. However, it would be reasonable to follow up with a Cardiologist after discharge from the hospital Our team will arrange this for you, and they will contact you with the appointment if it is not listed on this discharge paperwork    Resume his previous home  medicines: Amlodipine for blood pressure, Plavix for stroke prevention, risperidone and trazodone for sleep (and agitation in dementia), eye drops and sertraline for mood.   Increase activity slowly   Complete by:  As directed      Allergies as of 12/09/2017   No Known Allergies     Medication List    STOP taking these medications   memantine 10 MG tablet Commonly known as:  NAMENDA     TAKE these medications   acetaminophen 325 MG tablet Commonly known as:  TYLENOL Take 2 tablets (650 mg total) by mouth every 6 (six) hours as needed for mild pain (or Fever >/= 101).   amLODipine 10 MG tablet Commonly known as:  NORVASC Take 5 mg by mouth at bedtime.   amoxicillin-clavulanate 875-125 MG tablet Commonly known as:  AUGMENTIN Take 1 tablet by mouth 2 (two) times daily for 5 doses.   aspirin EC 81 MG tablet Take 1 tablet (81 mg total) by mouth daily.   atorvastatin 40 MG tablet Commonly known as:  LIPITOR Take 1 tablet (40 mg total) by mouth daily at 6 PM.   brimonidine 0.2 % ophthalmic solution Commonly known as:  ALPHAGAN Place 1 drop into both eyes 2 (two) times daily.   clopidogrel 75 MG tablet Commonly known as:  PLAVIX Take 75 mg by mouth daily.   gabapentin 100 MG capsule Commonly known as:  NEURONTIN Take 100 mg by mouth daily.   risperiDONE 1 MG tablet Commonly known as:  RISPERDAL  Take 1.5 mg by mouth 2 (two) times daily.   sertraline 25 MG tablet Commonly known as:  ZOLOFT Take 25 mg by mouth at bedtime.   timolol 0.5 % ophthalmic solution Commonly known as:  BETIMOL Place 1 drop into both eyes 2 (two) times daily.   traZODone 50 MG tablet Commonly known as:  DESYREL Take 50 mg by mouth at bedtime.      Follow-up Information    [facility] Follow [facility].   Specialty:  Home Health Services Why:  home health services arranged Contact information: [address] [place] [address] [phone]        [facility]  [place] Go on 12/18/2017.   Why:  Post hospital follow up scheduled for 12/18/2017 at 3:30 pm with Dr.Shannon Ocie Doyne information:   [place], [place]         No Known Allergies  Consultations:  Cardiology  Neurology  Ophthalmology   Procedures/Studies: Dg Chest 2 View  Result Date: 12/04/2017 CLINICAL DATA:  Vomiting.  Syncopal episode.  Crackles on the lungs. EXAM: CHEST - 2 VIEW COMPARISON:  None. FINDINGS: There is a focal area of airspace infiltration in the right lung base. This could indicate pneumonia or aspiration. Left lung is clear. No blunting of costophrenic angles. No pneumothorax. Heart size and pulmonary vascularity are normal. Calcification of the aorta. Mediastinal contours appear intact. Degenerative changes in the spine and shoulders. IMPRESSION: Focal infiltration in the right lung base may indicate pneumonia or aspiration. Electronically Signed   By: Lucienne Capers M.D.   On: 12/04/2017 23:10   Ct Head Wo Contrast  Result Date: 12/04/2017 CLINICAL DATA:  84 year old male with syncope. EXAM: CT HEAD WITHOUT CONTRAST TECHNIQUE: Contiguous axial images were obtained from the base of the skull through the vertex without intravenous contrast. COMPARISON:  None. FINDINGS: Brain: No evidence of acute infarction, hemorrhage, hydrocephalus, extra-axial collection or mass lesion/mass effect. Atrophy and chronic small-vessel white matter ischemic changes are noted. Vascular: Intracranial atherosclerotic calcifications noted. Skull: Normal. Negative for fracture or focal lesion. Sinuses/Orbits: No acute finding. Other: None. IMPRESSION: 1. No evidence of acute intracranial abnormality. 2. Atrophy and chronic small-vessel white matter ischemic changes. Electronically Signed   By: Margarette Canada M.D.   On: 12/04/2017 23:56   Mr Jodene Nam Head Wo Contrast  Result Date: 12/08/2017 CLINICAL DATA:  Follow-up examination for acute stroke.  EXAM: MRA HEAD WITHOUT CONTRAST TECHNIQUE: Angiographic images of the Circle of Willis were obtained using MRA technique without intravenous contrast. COMPARISON:  Prior MRI from 12/05/2017. FINDINGS: ANTERIOR CIRCULATION: Distal cervical segments of the internal carotid arteries are patent with antegrade flow. Petrous segments patent bilaterally. Scattered atheromatous irregularity within the cavernous ICAs without hemodynamically significant stenosis. ICA termini patent. Left A1 patent. Right A1 hypoplastic and/or absent. Normal anterior communicating artery. Anterior cerebral arteries demonstrate mild atheromatous irregularity but are patent to their distal aspects without high-grade stenosis. Mild atheromatous change within the M1 segments without significant stenosis. Normal MCA bifurcations. Distal MCA branches well perfused and symmetric. POSTERIOR CIRCULATION: Left vertebral artery patent to the vertebrobasilar junction without high-grade stenosis. Patent left PICA. Right vertebral artery not visualize, likely occluded. Right PICA not visualized. Basilar demonstrates scattered atheromatous irregularity without high-grade stenosis. Superior cerebral arteries patent bilaterally. Both of the posterior cerebral arteries primarily supplied via the basilar. Atheromatous irregularity throughout the PCAs bilaterally without significant stenosis. PCAs are patent to their distal  aspects. No intracranial aneurysm. IMPRESSION: 1. Occluded right vertebral artery. Otherwise patent vertebrobasilar system. 2. Moderate atheromatous irregularity throughout the intracranial circulation without proximal high-grade or correctable stenosis. Electronically Signed   By: Jeannine Boga M.D.   On: 12/08/2017 03:52   Mr Brain Wo Contrast  Result Date: 12/05/2017 CLINICAL DATA:  Focal neurological deficit. Syncopal episode yesterday. EXAM: MRI HEAD WITHOUT CONTRAST TECHNIQUE: Multiplanar, multiecho pulse sequences of the brain  and surrounding structures were obtained without intravenous contrast. COMPARISON:  CT 12/04/2017. FINDINGS: Brain: Diffusion imaging does not show any acute or subacute infarction. There is old infarction affecting the right side of the pons. There are a few old small vessel cerebellar infarctions. Cerebral hemispheres show moderate changes of chronic small vessel disease affecting the deep and subcortical white matter. No large vessel territory infarction. No mass lesion, hemorrhage, hydrocephalus or extra-axial collection. Vascular: Major vessels at the base of the brain show flow. Single exception to this is apparent absent flow in the right vertebral artery. Skull and upper cervical spine: Negative Sinuses/Orbits: Clear/normal Other: None IMPRESSION: No acute finding. Old infarction affecting the right side of the pons. Moderate chronic small-vessel ischemic changes of the cerebellum and cerebral hemispheric white matter. Absent flow in the right vertebral artery. Electronically Signed   By: Nelson Chimes M.D.   On: 12/05/2017 09:33   Echocardiogram 7/4 Study Conclusions  - Left ventricle: The cavity size was normal. Wall thickness was   increased in a pattern of mild LVH. Systolic function was normal.   The estimated ejection fraction was in the range of 60% to 65%.   Wall motion was normal; there were no regional wall motion   abnormalities. Doppler parameters are consistent with abnormal   left ventricular relaxation (grade 1 diastolic dysfunction). - Aortic valve: Mildly calcified annulus. Trileaflet; mildly   thickened, moderately calcified leaflets. There was severe   stenosis. Mean gradient (S): 31 mm Hg. VTI ratio of LVOT to   aortic valve: 0.2. Valve area (VTI): 0.69 cm^2. Valve area   (Vmean): 0.68 cm^2. - Mitral valve: Mildly calcified annulus. There was trivial   regurgitation. - Right atrium: Central venous pressure (est): 3 mm Hg. - Atrial septum: No defect or patent foramen  ovale was identified. - Tricuspid valve: There was mild regurgitation. - Pulmonary arteries: PA peak pressure: 27 mm Hg (S). - Pericardium, extracardiac: There was no pericardial effusion.    Carotid US 7/4 Final Interpretation: Right Carotid: Velocities in the right ICA are consistent with a 1-39% stenosis.  Left Carotid: Velocities in the left ICA are consistent with a 1-39% stenosis.  Vertebrals: Bilateral vertebral arteries demonstrate antegrade flow. Subclavians: Normal flow hemodynamics were seen in bilateral subclavian       arteries.  *See table(s) above for measurements and observations.   Subjective: Feels well.  Tired and sluggish but no new fever, cough, sputum, respiratory distress, vomiting.  Confusion is same, he is pleasant, easily redirectable.         Discharge Exam: Vitals:   12/09/17 0839 12/09/17 1247  BP: 122/69 132/74  Pulse: 71 63  Resp: 16 18  Temp: 98.1 F (36.7 C) 97.7 F (36.5 C)  SpO2: 100% 99%   Vitals:   12/09/17 0300 12/09/17 0500 12/09/17 0839 12/09/17 1247  BP: (!) 155/96 126/72 122/69 132/74  Pulse: 98 76 71 63  Resp:   16 18  Temp: 99.9 F (37.7 C)  98.1 F (36.7 C) 97.7 F (36.5 C)  TempSrc: Oral  Oral Oral  SpO2: 98% 97% 100% 99%  Weight:      Height:        General: Pt is alert, awake, not in acute distress, sitting up in recliner, sleepy Cardiovascular: Irregular, S1/S2 +, loud SEM, no rubs, no gallops Respiratory: No tachypnea, retractions, diminished in both bases, no wheezing.    Abdominal: Soft, NT, ND, bowel sounds + Extremities: no edema, no cyanosis    The results of significant diagnostics from this hospitalization (including imaging, microbiology, ancillary and laboratory) are listed below for reference.     Microbiology: Recent Results (from the past 240 hour(s))  Urine culture     Status: Abnormal   Collection Time: 12/05/17  4:30 AM  Result Value Ref Range Status   Specimen Description  URINE, RANDOM  Final   Special Requests NONE  Final   Culture (A)  Final    40,000 COLONIES/mL GROUP B STREP(S.AGALACTIAE)ISOLATED TESTING AGAINST S. AGALACTIAE NOT ROUTINELY PERFORMED DUE TO PREDICTABILITY OF AMP/PEN/VAN SUSCEPTIBILITY. Performed at [facility], [place] [address] [place], [place] [address]    Report Status 12/06/2017 FINAL  Final  Culture, blood (routine x 2) Call MD if unable to obtain prior to antibiotics being given     Status: None (Preliminary result)   Collection Time: 12/05/17  4:45 AM  Result Value Ref Range Status   Specimen Description BLOOD RIGHT HAND  Final   Special Requests   Final    BOTTLES DRAWN AEROBIC ONLY Blood Culture adequate volume   Culture   Final    NO GROWTH 4 DAYS Performed at [facility], [place] [address], [place], [place] [address]    Report Status PENDING  Incomplete  Culture, blood (routine x 2) Call MD if unable to obtain prior to antibiotics being given     Status: None (Preliminary result)   Collection Time: 12/05/17  4:50 AM  Result Value Ref Range Status   Specimen Description BLOOD RIGHT HAND  Final   Special Requests   Final    BOTTLES DRAWN AEROBIC ONLY Blood Culture adequate volume   Culture   Final    NO GROWTH 4 DAYS Performed at [facility], [place] [address], [place], [place] [address]    Report Status PENDING  Incomplete     Labs: BNP (last 3 results) Recent Labs    12/04/17 2321  BNP 712.4*   Basic Metabolic Panel: Recent Labs  Lab 12/04/17 2321 12/05/17 0454 12/06/17 0617 12/07/17 0705 12/08/17 0624 12/09/17 0520  NA 139 142 140 141 143 143  K 3.8 3.8 3.6 3.3* 3.4* 3.1*  CL 107 106 106 104 110 108  CO2 25 26 26 25 26 28   GLUCOSE 111* 92 96 104* 100* 91  BUN 7* 9 9 7* 7* 9  CREATININE 1.04 1.06 1.10 0.96 0.93 0.98  CALCIUM 8.4* 8.5* 8.7* 8.8* 8.4* 8.4*  MG 2.1  --   --   --   --   --    Liver Function Tests: Recent Labs  Lab 12/04/17 2321 12/05/17 0454  AST 19 20  ALT 13 13   ALKPHOS 78 75  BILITOT 1.4* 1.6*  PROT 6.0* 5.9*  ALBUMIN 2.9* 2.8*   No results for input(s): LIPASE, AMYLASE in the last 168 hours. No results for input(s): AMMONIA in the last 168 hours. CBC: Recent Labs  Lab 12/04/17 2321 12/05/17 0454 12/07/17 0705 12/08/17 0624 12/09/17 0520  WBC 8.9 7.9 5.2 4.1 4.7  NEUTROABS  --  6.5  --   --   --   HGB 13.1 12.4* 13.0 11.9* 12.0*  HCT 41.5 39.0 40.0 37.8* 37.6*  MCV 91.4 90.1 88.7 90.2 90.6  PLT 196 177 176 175 174   Cardiac Enzymes: Recent Labs  Lab 12/05/17 0454 12/05/17 1034 12/05/17 1910  TROPONINI <0.03 <0.03 <0.03   BNP: Invalid input(s): POCBNP CBG: Recent Labs  Lab 12/04/17 2144  GLUCAP 95   D-Dimer No results for input(s): DDIMER in the last 72 hours. Hgb A1c No results for input(s): HGBA1C in the last 72 hours. Lipid Profile No results for input(s): CHOL, HDL, LDLCALC, TRIG, CHOLHDL, LDLDIRECT in the last 72 hours. Thyroid function studies No results for input(s): TSH, T4TOTAL, T3FREE, THYROIDAB in the last 72 hours.  Invalid input(s): FREET3 Anemia work up No results for input(s): VITAMINB12, FOLATE, FERRITIN, TIBC, IRON, RETICCTPCT in the last 72 hours. Urinalysis    Component Value Date/Time   COLORURINE AMBER (A) 12/04/2017 2144   APPEARANCEUR CLEAR 12/04/2017 2144   LABSPEC 1.018 12/04/2017 2144   PHURINE 6.0 12/04/2017 2144   GLUCOSEU NEGATIVE 12/04/2017 2144   HGBUR NEGATIVE 12/04/2017 2144   [place] NEGATIVE 12/04/2017 2144   [place] NEGATIVE 12/04/2017 2144   PROTEINUR NEGATIVE 12/04/2017 2144   NITRITE NEGATIVE 12/04/2017 2144   LEUKOCYTESUR NEGATIVE 12/04/2017 2144   Sepsis Labs Invalid input(s): PROCALCITONIN,  WBC,  LACTICIDVEN Microbiology Recent Results (from the past 240 hour(s))  Urine culture     Status: Abnormal   Collection Time: 12/05/17  4:30 AM  Result Value Ref Range Status   Specimen Description URINE, RANDOM  Final   Special Requests NONE  Final   Culture  (A)  Final    40,000 COLONIES/mL GROUP B STREP(S.AGALACTIAE)ISOLATED TESTING AGAINST S. AGALACTIAE NOT ROUTINELY PERFORMED DUE TO PREDICTABILITY OF AMP/PEN/VAN SUSCEPTIBILITY. Performed at [facility], [place] [address], [place], [place] [address]    Report Status 12/06/2017 FINAL  Final  Culture, blood (routine x 2) Call MD if unable to obtain prior to antibiotics being given     Status: None (Preliminary result)   Collection Time: 12/05/17  4:45 AM  Result Value Ref Range Status   Specimen Description BLOOD RIGHT HAND  Final   Special Requests   Final    BOTTLES DRAWN AEROBIC ONLY Blood Culture adequate volume   Culture   Final    NO GROWTH 4 DAYS Performed at [facility], [place] [address], [place], [place] [address]    Report Status PENDING  Incomplete  Culture, blood (routine x 2) Call MD if unable to obtain prior to antibiotics being given     Status: None (Preliminary result)   Collection Time: 12/05/17  4:50 AM  Result Value Ref Range Status   Specimen Description BLOOD RIGHT HAND  Final   Special Requests   Final    BOTTLES DRAWN AEROBIC ONLY Blood Culture adequate volume   Culture   Final    NO GROWTH 4 DAYS Performed at [facility], [place] [address], [place], [place] [address]    Report Status PENDING  Incomplete     Time coordinating discharge: 45 minutes       SIGNED:   Edwin Dada, MD  [facility] 12/09/2017, 2:37 PM

## 2017-12-09 NOTE — Progress Notes (Signed)
Patient will DC to: [facility] Anticipated DC date: 12/09/17 Family notified: Daughter, Midwifeonita Transport by: Sharin MonsPTAR   Per MD [facility]. RN, patient, patient's family, and facility notified of DC. Discharge Summary sent to facility. RN given number for report [phone] Room 107). DC packet on chart. Ambulance transport requested for patient.   CSW signing off.  Cristobal GoldmannNadia Oriel Ojo, LCSW Clinical Social Worker [phone]

## 2017-12-09 NOTE — Progress Notes (Signed)
Left voicemail for daughter regarding discharge plan.  Osborne Cascoadia Nitara Szczerba LCSW [phone]

## 2017-12-09 NOTE — Progress Notes (Signed)
Dan Stone to be D/C'd to SNF per MD order.   VSS, Skin clean, dry and intact without evidence of skin break down, no evidence of skin tears noted. IV catheter discontinued intact. Site without signs and symptoms of complications. Dressing and pressure applied.  An After Visit Summary was printed and given to PTAR. PTAR received prescription.  D/c education completed with family including follow up instructions, medication list, d/c activities limitations if indicated, with other d/c instructions as indicated by MD - family able to verbalize understanding, all questions fully answered.   Patient instructed to return to ED, call 911, or call MD for any changes in condition.   Patient escorted via stretcher, and D/C to SNF via PTAR.  Dan Stone 12/09/2017 4:26 PM

## 2017-12-09 NOTE — Progress Notes (Signed)
Physical Therapy Treatment Patient Details Name: Dan Stone MRN: [id number] DOB: 1933/12/06 Today's Date: 12/09/2017    History of Present Illness Dan Stone is a 84 y.o. 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 dx with pna, also with new onset R eye vision loss    PT Comments    Continuing work on functional mobility and activity tolerance;  Able to progress activity tolerance, and walk in room today with mod assist; Noted CIR did not take [name] for  [employer], so will update dc plan for SNF for post-acute rehab to maximize independence and safety with mobility   Follow Up Recommendations  SNF     Equipment Recommendations  Other (comment)(TBD by next venue)    Recommendations for Other Services       Precautions / Restrictions Precautions Precautions: Fall Restrictions Weight Bearing Restrictions: No    Mobility  Bed Mobility Overal bed mobility: Needs Assistance Bed Mobility: Supine to Sit     Supine to sit: Mod assist     General bed mobility comments: increased time, pt intitiated bringing LEs off EOB however was unable to sequence bringing trunk up to sit EOB. once pt sitting EOb safely pt able to maintain  Transfers Overall transfer level: Needs assistance Equipment used: Rolling walker (2 wheeled) Transfers: Sit to/from Stand Sit to Stand: Mod assist;+2 physical assistance         General transfer comment: Heavy mod assist to power up to stand; verbal and tactile cueing for fully upright posture; tended to brace backs of LE against bed  Ambulation/Gait Ambulation/Gait assistance: Mod assist;+2 physical assistance Gait Distance (Feet): 15 Feet Assistive device: Rolling walker (2 wheeled) Gait Pattern/deviations: Decreased step length - right;Decreased step length - left;Decreased stride  length;Trunk flexed     General Gait Details: Walked bed to door of room with 2 person assist and thrid person pushing chair behind; short steps and tactile faciliatation of weight shift fully into stance to allow for swing LE to step R and L   Stairs             Wheelchair Mobility    Modified Rankin (Stroke Patients Only)       Balance     Sitting balance-Leahy Scale: Fair       Standing balance-Leahy Scale: Poor Standing balance comment: dependent on physical assist                            Cognition Arousal/Alertness: Awake/alert Behavior During Therapy: WFL for tasks assessed/performed Overall Cognitive Status: History of cognitive impairments - at baseline                                 General Comments: pt with dementia. pt did follow all commands and participate in therapy. stated name and birthdate. Able t engage in playful conversation      Exercises      General Comments        Pertinent Vitals/Pain Pain Assessment: Faces Faces Pain Scale: No hurt Pain Intervention(s): Monitored during session    Home Living                      Prior Function  PT Goals (current goals can now be found in the care plan section) Acute Rehab PT Goals Patient Stated Goal: Agreeable to amb PT Goal Formulation: With patient Time For Goal Achievement: 12/20/17 Potential to Achieve Goals: Good Progress towards PT goals: Progressing toward goals    Frequency    Min 3X/week      PT Plan Current plan remains appropriate    Co-evaluation              AM-PAC PT "6 Clicks" Daily Activity  Outcome Measure  Difficulty turning over in bed (including adjusting bedclothes, sheets and blankets)?: Unable Difficulty moving from lying on back to sitting on the side of the bed? : Unable Difficulty sitting down on and standing up from a chair with arms (e.g., wheelchair, bedside commode, etc,.)?: Unable Help  needed moving to and from a bed to chair (including a wheelchair)?: A Lot Help needed walking in hospital room?: A Lot Help needed climbing 3-5 steps with a railing? : A Lot 6 Click Score: 9    End of Session Equipment Utilized During Treatment: Gait belt Activity Tolerance: Patient tolerated treatment well Patient left: in chair;with call bell/phone within reach;Other (comment)(sitter in room) Nurse Communication: Mobility status PT Visit Diagnosis: Muscle weakness (generalized) (M62.81)     Time: [id number] PT Time Calculation (min) (ACUTE ONLY): 27 min  Charges:  $Gait Training: 8-22 mins $Therapeutic Activity: 8-22 mins                    G Codes:       Van ClinesHolly Shah Insley, PT  Acute Rehabilitation Services Pager [phone] Office [phone]    Levi AlandHolly H Jahdai Padovano 12/09/2017, 1:52 PM

## 2017-12-09 NOTE — Care Management Important Message (Signed)
Important Message  Patient Details  Name: Dan Stone MRN: [id number] Date of Birth: 12-Apr-1934   Medicare Important Message Given:  Yes    [name] 12/09/2017, 4:25 PM

## 2017-12-09 NOTE — Progress Notes (Signed)
Whenever pt bradys down, he comes up back as a second degree type 2 block. NP on call was notified. Will continue to monitor.

## 2017-12-09 NOTE — Progress Notes (Addendum)
Progress Note  Patient Name: Dan Stone Date of Encounter: 12/09/2017  Primary Cardiologist: Seen by Dr. Graciela HusbandsKlein/ Dr. Wyline MoodBranch and Dr. SwazilandJordan  Subjective   Denies any pain, able to answer questions by mumble but mostly still half asleep. Room monitor sitting in the room, did not report acute event. Mostly sleeping overnight. Patient seems to be able to answer some question, but no clear stream of thought. Unclear baseline mental status.   Inpatient Medications    Scheduled Meds: . aspirin  81 mg Oral Daily  . atorvastatin  40 mg Oral q1800  . clopidogrel  75 mg Oral Daily  . gabapentin  100 mg Oral Daily  . QUEtiapine  25 mg Oral QHS  . sertraline  25 mg Oral QHS  . traZODone  50 mg Oral QHS   Continuous Infusions: . sodium chloride 10 mL/hr at 12/07/17 1000  . ampicillin-sulbactam (UNASYN) IV 1.5 g (12/09/17 0527)   PRN Meds: acetaminophen **OR** acetaminophen, hydrALAZINE, ondansetron **OR** ondansetron (ZOFRAN) IV   Vital Signs    Vitals:   12/08/17 2123 12/08/17 2300 12/09/17 0300 12/09/17 0500  BP: (!) 149/94 136/81 (!) 155/96 126/72  Pulse: 64 (!) 58 98 76  Resp: 18     Temp:  99.2 F (37.3 C) 99.9 F (37.7 C)   TempSrc:  Oral Oral   SpO2: 100% 100% 98% 97%  Weight:      Height:       No intake or output data in the 24 hours ending 12/09/17 0800 Filed Weights   12/04/17 2141  Weight: 250 lb (113.4 kg)    Telemetry    Wandering atrial pacemaker, no afib, some bradycardia overnight, slowest in the high 30s, mostly 50-60s - Personally Reviewed  ECG    Sinus rhythm with wandering pacemaker, TWI in inferolateral leads - Personally Reviewed  Physical Exam   GEN: No acute distress. Asleep, hand mitten on, tied to bed rail   Neck: No JVD Cardiac: RRR, no rubs, or gallops.  2/6 systolic murmur near RUSB Respiratory: Clear to auscultation bilaterally. GI: Soft, nontender, non-distended  MS: No edema; No deformity. Neuro:  unable to assess, asleep,  respond to question by mumbling Psych: unable to assess   Labs    Chemistry Recent Labs  Lab 12/04/17 2321 12/05/17 0454  12/07/17 0705 12/08/17 0624 12/09/17 0520  NA 139 142   < > 141 143 143  K 3.8 3.8   < > 3.3* 3.4* 3.1*  CL 107 106   < > 104 110 108  CO2 25 26   < > 25 26 28   GLUCOSE 111* 92   < > 104* 100* 91  BUN 7* 9   < > 7* 7* 9  CREATININE 1.04 1.06   < > 0.96 0.93 0.98  CALCIUM 8.4* 8.5*   < > 8.8* 8.4* 8.4*  PROT 6.0* 5.9*  --   --   --   --   ALBUMIN 2.9* 2.8*  --   --   --   --   AST 19 20  --   --   --   --   ALT 13 13  --   --   --   --   ALKPHOS 78 75  --   --   --   --   BILITOT 1.4* 1.6*  --   --   --   --   GFRNONAA >60 >60   < > >60 >60 >60  GFRAA >  60 >60   < > >60 >60 >60  ANIONGAP 7 10   < > 12 7 7    < > = values in this interval not displayed.     Hematology Recent Labs  Lab 12/07/17 0705 12/08/17 0624 12/09/17 0520  WBC 5.2 4.1 4.7  RBC 4.51 4.19* 4.15*  HGB 13.0 11.9* 12.0*  HCT 40.0 37.8* 37.6*  MCV 88.7 90.2 90.6  MCH 28.8 28.4 28.9  MCHC 32.5 31.5 31.9  RDW 13.7 14.3 14.3  PLT 176 175 174    Cardiac Enzymes Recent Labs  Lab 12/05/17 0454 12/05/17 1034 12/05/17 1910  TROPONINI <0.03 <0.03 <0.03    Recent Labs  Lab 12/04/17 2325 12/05/17 0102  TROPIPOC 0.00 0.01     BNP Recent Labs  Lab 12/04/17 2321  BNP 172.2*     DDimer No results for input(s): DDIMER in the last 168 hours.   Radiology    Mr Maxine Glenn Head Wo Contrast  Result Date: 12/08/2017 CLINICAL DATA:  Follow-up examination for acute stroke. EXAM: MRA HEAD WITHOUT CONTRAST TECHNIQUE: Angiographic images of the Circle of Willis were obtained using MRA technique without intravenous contrast. COMPARISON:  Prior MRI from 12/05/2017. FINDINGS: ANTERIOR CIRCULATION: Distal cervical segments of the internal carotid arteries are patent with antegrade flow. Petrous segments patent bilaterally. Scattered atheromatous irregularity within the cavernous ICAs without  hemodynamically significant stenosis. ICA termini patent. Left A1 patent. Right A1 hypoplastic and/or absent. Normal anterior communicating artery. Anterior cerebral arteries demonstrate mild atheromatous irregularity but are patent to their distal aspects without high-grade stenosis. Mild atheromatous change within the M1 segments without significant stenosis. Normal MCA bifurcations. Distal MCA branches well perfused and symmetric. POSTERIOR CIRCULATION: Left vertebral artery patent to the vertebrobasilar junction without high-grade stenosis. Patent left PICA. Right vertebral artery not visualize, likely occluded. Right PICA not visualized. Basilar demonstrates scattered atheromatous irregularity without high-grade stenosis. Superior cerebral arteries patent bilaterally. Both of the posterior cerebral arteries primarily supplied via the basilar. Atheromatous irregularity throughout the PCAs bilaterally without significant stenosis. PCAs are patent to their distal aspects. No intracranial aneurysm. IMPRESSION: 1. Occluded right vertebral artery. Otherwise patent vertebrobasilar system. 2. Moderate atheromatous irregularity throughout the intracranial circulation without proximal high-grade or correctable stenosis. Electronically Signed   By: Rise Mu M.D.   On: 12/08/2017 03:52    Cardiac Studies   Echo 12/05/2017 LV EF: 60% -   65% Study Conclusions  - Left ventricle: The cavity size was normal. Wall thickness was   increased in a pattern of mild LVH. Systolic function was normal.   The estimated ejection fraction was in the range of 60% to 65%.   Wall motion was normal; there were no regional wall motion   abnormalities. Doppler parameters are consistent with abnormal   left ventricular relaxation (grade 1 diastolic dysfunction). - Aortic valve: Mildly calcified annulus. Trileaflet; mildly   thickened, moderately calcified leaflets. There was severe   stenosis. Mean gradient (S): 31 mm  Hg. VTI ratio of LVOT to   aortic valve: 0.2. Valve area (VTI): 0.69 cm^2. Valve area   (Vmean): 0.68 cm^2. - Mitral valve: Mildly calcified annulus. There was trivial   regurgitation. - Right atrium: Central venous pressure (est): 3 mm Hg. - Atrial septum: No defect or patent foramen ovale was identified. - Tricuspid valve: There was mild regurgitation. - Pulmonary arteries: PA peak pressure: 27 mm Hg (S). - Pericardium, extracardiac: There was no pericardial effusion.  Patient Profile     84 y.o.  male with PMH of HTN, CVA, and dementia admitted for syncope. He was felt to have acute visual loss, underwent workup for central retinal artery occlusion. Seen by Dr. Graciela Husbands, recommended TEE for cardioembolic source and also look at the aortic valve. However later report from family and Claris Gower indicated he has chronic visual loss due to retinal vein occlusion. He was found to have moderate to severe AS, however felt to be poor candidate for AVR. Echo personally reviewed by Dr. Swaziland, does not appears to be critical. Recommend TTE in 6 month.    Assessment & Plan    1. Syncope:   - seen by Dr. Graciela Husbands, felt to be vaso-vagal. Recommended TEE in the setting of possible cardioembolic event to the eye, however previous record and family account suggested visual loss is chronic  - some bradycardia overnight down to high 30s, this is nocturnal bradycardia during sleep, mostly 50-60bpm. No indication for pacer or loop recorder at this point. No Pause > 3 sec  2. Aortic stenosis: Echo personally reviewed by Dr. Swaziland, felt aortic valve to be in the moderate to severe range by not criitical. Recommend TTE in 6 month. Unclear benefit of TEE at this point as patient is a poor candidate for any surgery given baseline mental status. Procedure unlikely to improve his quality of life  3. Visual loss of R eye: initially felt to be new and underwent workup for central retinal artery occlusion. Carotid US no  significant disease. Neuro feels likely cardioembolic. Initially planned for TEE today, however later report by family and outside record from his Ophthamologist in [place] suggested chronic visual loss due to CRVO from hypertension/DM/glaucoma. Will discuss with MD, given poor candidate for aortic valve procedure, unless felt strongly by neurologist that cardioembolic event has occured, would not proceed with TEE today.  4. Wandering atrial pacemaker: no evidence of afib  5. Dementia: significant sundowning.   6. Dysphagia: concern of aspiration, speech eval 7/6  7. Hypokalemia: K 3.1 this morning, defer to primary team   For questions or updates, please contact [facility] Please consult [email or web address] for contact info under Cardiology/STEMI.     Ramond Dial, PA  12/09/2017, 8:00 AM     The patient was seen, examined and discussed with Azalee Course, PA-C and I agree with the above.   I have reviewed patients telemetry that shows wandering atrial PM, frequent PACs, short runs of atrial tachycardia, intermittent 2:1 block, no pauses longer than 3 seconds. There is no indication for a PM. I have also reviewed his echocardiogram, his aortic stenosis is moderate to severe, however the patient is not a surgical candidate, given underlying dementia and being immobile he is currently not a candidate for a TAVR either.  He should be consulted by palliative care/hospice.  Tobias Alexander, MD 12/09/2017   Tobias Alexander, MD 12/09/2017

## 2017-12-09 NOTE — Progress Notes (Signed)
Inpatient Rehabilitation  Please see consult by Dr. Allena KatzPatel for full details, recommending less intense post acute rehab. CSW involved in case.  Will sign off at this time.  Call if questions.   Charlane FerrettiMelissa Dalyla Chui, M.A., CCC/SLP Admission Coordinator  [facility]  Cell [phone]

## 2017-12-10 ENCOUNTER — Encounter: Payer: Self-pay | Admitting: Internal Medicine

## 2017-12-10 ENCOUNTER — Non-Acute Institutional Stay (SKILLED_NURSING_FACILITY): Payer: Medicare Other | Admitting: Internal Medicine

## 2017-12-10 DIAGNOSIS — H349 Unspecified retinal vascular occlusion: Secondary | ICD-10-CM

## 2017-12-10 DIAGNOSIS — F0150 Vascular dementia without behavioral disturbance: Secondary | ICD-10-CM

## 2017-12-10 DIAGNOSIS — J181 Lobar pneumonia, unspecified organism: Secondary | ICD-10-CM

## 2017-12-10 DIAGNOSIS — J189 Pneumonia, unspecified organism: Secondary | ICD-10-CM

## 2017-12-10 LAB — CULTURE, BLOOD (ROUTINE X 2)
CULTURE: NO GROWTH
Culture: NO GROWTH
Special Requests: ADEQUATE
Special Requests: ADEQUATE

## 2017-12-10 NOTE — Progress Notes (Signed)
NURSING HOME LOCATION:  [facility] ROOM NUMBER:  [id number]  CODE STATUS:  Full Code  PCP:  Michel Santeehalavadi, Lakshmi, MD  [address] [place] [id number]  Will need new PCP in [place]  This is a comprehensive admission note to [facility] performed on this date less than 30 days from date of admission. Included are preadmission medical/surgical history; reconciled medication list; family history; social history and comprehensive review of systems.  Corrections and additions to the records were documented. Comprehensive physical exam was also performed. Additionally a clinical summary was entered for each active diagnosis pertinent to this admission in the Problem List to enhance continuity of care.  HPI:  The patient was hospitalized 7/3-12/09/17, admitted from home with syncopal episode. In the ED he was found to have right lower lobe pneumonia with associated hypotension. Possible aspiration etiology was suspected. Speech therapy recommended no specific dietary modifications as subsequent swallowing evaluation appeared normal. He did not exhibit fever or elevated white count. Pro calcitonin was elevated but strep pneumo and blood cultures were negative. Initially he had intermittent bradycardia, cardiology felt this was vagally mediated. No clinical indication for pacemaker was clinically present. During hospitalization telemetry revealed no symptomatic arrhythmia; but he did have a wandering pacemaker Moderate aortic stenosis was noted. 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 Initially this was of concern for an acute issue but family reported this as pre-existing. Diagnosis was glaucoma with central retinal vein occlusion as per his ophthalmologist Dr. Ronnell FreshwaterGreenman in [place]. Sedimentation rate was low. Dementia persisted while hospitalized with significant sundowning. Aricept and Namenda to some extent were contraindicated as these are associated with  bradycardias. Risperidone, and sertraline were continued. For secondary prevention ASA 81 mg and Plavix were resumed. Statin was initiated because of an LDL 180. Extensive cerebral atherosclerosis was present on MRA of the head.  Past medical and surgical history: Includes stroke possibly in 2014. He has essential hypertension. He apparently has had some ophthalmologic procedure or surgery.  Social history: The patient recently moved from [place] to live near his daughter. Apparently he's never drunk alcohol. Never smoker.  Family history: Apparently his father had CAD.   Review of systems:  Could not be completed due to dementia. Date given as November, 2080. He believes he is still in [place]. When I asked him why his right eye was closed he stated it was due to "dust off furniture".   Physical exam:  Pertinent or positive findings: As noted the right eye was initially closed but he could open it. When open there was slight OD exotropia. Also has ptosis on the right. Dental hygiene is fair. He has a grade 1. 5-2 systolic AS murmur at the right base. Breath sounds are decreased. Abdomen is protuberant. He is diffusely weak but markedly more so in the left upper and left lower extremity. He follows commands poorly.  General appearance: Adequately nourished; no acute distress, increased work of breathing is present.   Lymphatic: No lymphadenopathy about the head, neck, axilla. Eyes: No conjunctival inflammation or lid edema is present. There is no scleral icterus. Ears:  External ear exam shows no significant lesions or deformities.   Nose:  External nasal examination shows no deformity or inflammation. Nasal mucosa are pink and moist without lesions, exudates Oral exam: Lips and gums are healthy appearing.There is no oropharyngeal erythema or exudate. Neck:  No thyromegaly, masses, tenderness noted.    Heart:  Normal rate and regular rhythm. S1 and S2  normal without gallop, click, rub.    Lungs:  without wheezes, rhonchi, rales, rubs. Abdomen: Bowel sounds are normal.  Abdomen is soft and nontender with no organomegaly, hernias, masses. GU: Deferred  Extremities:  No cyanosis, clubbing, edema. Neurologic exam:  Balance, Rhomberg, finger to nose testing could not be completed due to clinical state Skin: Warm & dry w/o tenting. No significant lesions or rash.  See clinical summary under each active problem in the Problem List with associated updated therapeutic plan

## 2017-12-10 NOTE — Assessment & Plan Note (Signed)
Complete oral Augmentin

## 2017-12-10 NOTE — Assessment & Plan Note (Addendum)
Aricept and Namenda would not be indicated for vascular dementia 12/10/17 SLUMS score 14/30 SLUMS is a mental status assessment of possible dementia published by the [facility]. [employer]. The test differentiates between high school or less education level in reference to presence or absence of dementia. For high school education a score of 27-30 is normal, 21-26 is minimal neurocognitive deficit and 1-22 suggests dementia. With less than a high school education similar scoring is 25-30, 20-24, and 1-19. He has "English degree from [employer]"

## 2017-12-11 NOTE — Patient Instructions (Signed)
See assessment and plan under each diagnosis in the problem list and acutely for this visit 

## 2017-12-12 ENCOUNTER — Non-Acute Institutional Stay (SKILLED_NURSING_FACILITY): Payer: Medicare Other | Admitting: Internal Medicine

## 2017-12-12 ENCOUNTER — Encounter: Payer: Self-pay | Admitting: Internal Medicine

## 2017-12-12 DIAGNOSIS — R55 Syncope and collapse: Secondary | ICD-10-CM | POA: Diagnosis not present

## 2017-12-12 DIAGNOSIS — J181 Lobar pneumonia, unspecified organism: Secondary | ICD-10-CM

## 2017-12-12 DIAGNOSIS — F0150 Vascular dementia without behavioral disturbance: Secondary | ICD-10-CM

## 2017-12-12 DIAGNOSIS — I10 Essential (primary) hypertension: Secondary | ICD-10-CM | POA: Diagnosis not present

## 2017-12-12 DIAGNOSIS — J189 Pneumonia, unspecified organism: Secondary | ICD-10-CM

## 2017-12-12 MED ORDER — AMLODIPINE BESYLATE 5 MG PO TABS: 5.0000 mg | ORAL_TABLET | Freq: Every day | ORAL | 0 refills | Status: DC

## 2017-12-12 MED ORDER — TRAZODONE HCL 50 MG PO TABS: 50.0000 mg | ORAL_TABLET | Freq: Every day | ORAL | 0 refills | Status: DC

## 2017-12-12 MED ORDER — GABAPENTIN 100 MG PO CAPS: 100.0000 mg | ORAL_CAPSULE | Freq: Every day | ORAL | 0 refills | Status: DC

## 2017-12-12 MED ORDER — CLOPIDOGREL BISULFATE 75 MG PO TABS: 75.0000 mg | ORAL_TABLET | Freq: Every day | ORAL | 0 refills | Status: DC

## 2017-12-12 MED ORDER — RISPERIDONE 1 MG PO TABS: 1.5000 mg | ORAL_TABLET | Freq: Two times a day (BID) | ORAL | 0 refills | Status: DC

## 2017-12-12 MED ORDER — ATORVASTATIN CALCIUM 40 MG PO TABS: 40.0000 mg | ORAL_TABLET | Freq: Every day | ORAL | 0 refills | Status: DC

## 2017-12-12 NOTE — Assessment & Plan Note (Signed)
Wandering pacemaker documented on telemetry, no clinical indication for pacemaker as per cardiology

## 2017-12-12 NOTE — Assessment & Plan Note (Addendum)
See 12/10/17 SLUMS results Psychotropic medications will need to be reevaluated following discharge from the SNF

## 2017-12-12 NOTE — Progress Notes (Signed)
   NURSING HOME LOCATION:  [facility]  ROOM NUMBER:  [id number]  CODE STATUS:  Full Code   PCP:  Michel Santeehalavadi, Lakshmi, MD , [place], [place] New PCP: Beau FannySharon Banks ,Lacey JensenLeBauer Brassfield   The patient is being discharged from [facility] tomorrow 12/13/17 by Marga MelnickWilliam Hopper MD.  The medical history in this facility was reviewed and summarized and medical problem list was updated. Time spent and note content is documented as follows.  Summary of [facility] medical records consists of the admission history and physical 12/10/17. He had been admitted 7/8 to the SNF after being hospitalized 7/3-7/8 for a syncopal episode. He was found to have right lower lobe pneumonia with hypotension. Aspiration was suspect but Speech Therapy could not identify definite dysphagia and aspiration. Vagal mediated bradycardia was felt to be present. Cardiology felt there was no indication for pacemaker although telemetry did reveal a wandering pacemaker. This was in the context of moderate aortic stenosis.  Initially while hospitalized it was questioned he had acute blindness in the right eye. This is a chronic issue related to glaucoma and retinal artery occlusion according to his ophthalmologist in [place]. He does have dementia with sundowning. Apparently he was placed on psychotropic drugs (Trazadone,Resperdone,  and sertraline) ? [place].. Aricept & Namenda were not initiated as these can be associated with bradycardias. Also the etiology of his dementia is most likely vascular which would not respond to these medications. The patient had been moved to [place] by his daughter from [place]. He was admitted to this facility post hospital discharge to complete "a few days of rehab". His daughter plans to take him back to her home. Social Services has arranged for PCP coverage as he has no primary care physician in this area.   Review of systems: He denies any active symptoms but replies are  unreliable due to dementia. He still believes he is in [place]. When asked the date he looked at his wrist band.  Physical exam:  Pertinent or positive findings: Has asymmetric ptosis, greater on the right. He has AS murmur at the R base and left sternal border. He has minor rales in the bases but chest is surprisingly clear. Pedal pulses are decreased. He has diffuse weakness, greater in the left upper and left lower extremities.  General appearance: Adequately nourished; no acute distress, increased work of breathing is present.   Lymphatic: No lymphadenopathy about the head, neck, axilla. Eyes: No conjunctival inflammation or lid edema is present. There is no scleral icterus. Ears:  External ear exam shows no significant lesions or deformities.   Nose:  External nasal examination shows no deformity or inflammation. Nasal mucosa are pink and moist without lesions, exudates Oral exam: Lips and gums are healthy appearing.There is no oropharyngeal erythema or exudate. Neck:  No thyromegaly, masses, tenderness noted.    Heart:  without gallop, click, rub.  Lungs:  without wheezes, rhonchi, rubs. Abdomen: Bowel sounds are normal. Abdomen is soft and nontender with no organomegaly, hernias, masses. GU: Deferred as previously addressed. Extremities:  No cyanosis, clubbing, edema  Neurologic exam:  Balance, Rhomberg, finger to nose testing could not be completed due to clinical state Skin: Warm & dry w/o tenting. No significant lesions or rash.  See clinical summary of Discharge Diagnoses in the Problem List with associated updated therapeutic plan   Follow-up will be by the primary care physician in 3 weeks

## 2017-12-12 NOTE — Assessment & Plan Note (Addendum)
Full course of antibiotic completed Must be monitored for aspiration in the context of his prior stroke

## 2017-12-13 ENCOUNTER — Encounter: Payer: Self-pay | Admitting: Internal Medicine

## 2017-12-13 NOTE — Patient Instructions (Signed)
See assessment and plan under each diagnosis in the problem list and acutely for this visit 

## 2017-12-13 NOTE — Assessment & Plan Note (Signed)
Recheck; if >140/90, increase Amlodipine until seen by new PCP

## 2017-12-18 ENCOUNTER — Ambulatory Visit: Payer: Medicare Other | Admitting: Family Medicine

## 2017-12-18 ENCOUNTER — Telehealth: Payer: Self-pay | Admitting: Family Medicine

## 2017-12-18 NOTE — Telephone Encounter (Signed)
LM for Dan Stone that pt has never been seen in our office so we cannot provide any orders.

## 2017-12-18 NOTE — Telephone Encounter (Unsigned)
Copied from CRM [id number]. Topic: Quick Communication - See Telephone Encounter >> Dec 18, 2017  3:01 PM Raquel SarnaHayes, Teresa G wrote: Damaris SchoonerKendra Richardson, PT w/ [facility] [phone]- [phone]  Verbal orders:  2 times a week for 1 week 3 times a week for 2 weeks 2 time a week for 3 weeks 1 time a week for 2 weeks

## 2018-01-09 ENCOUNTER — Ambulatory Visit (INDEPENDENT_AMBULATORY_CARE_PROVIDER_SITE_OTHER): Payer: Medicare Other | Admitting: Family Medicine

## 2018-01-09 ENCOUNTER — Encounter: Payer: Self-pay | Admitting: Family Medicine

## 2018-01-09 ENCOUNTER — Telehealth: Payer: Self-pay

## 2018-01-09 VITALS — BP 122/80 | HR 59 | Temp 98.5°F | Wt 201.5 lb

## 2018-01-09 DIAGNOSIS — F0150 Vascular dementia without behavioral disturbance: Secondary | ICD-10-CM

## 2018-01-09 DIAGNOSIS — Z7689 Persons encountering health services in other specified circumstances: Secondary | ICD-10-CM

## 2018-01-09 DIAGNOSIS — J181 Lobar pneumonia, unspecified organism: Secondary | ICD-10-CM | POA: Diagnosis not present

## 2018-01-09 DIAGNOSIS — I350 Nonrheumatic aortic (valve) stenosis: Secondary | ICD-10-CM

## 2018-01-09 DIAGNOSIS — H349 Unspecified retinal vascular occlusion: Secondary | ICD-10-CM | POA: Diagnosis not present

## 2018-01-09 DIAGNOSIS — I10 Essential (primary) hypertension: Secondary | ICD-10-CM

## 2018-01-09 DIAGNOSIS — J189 Pneumonia, unspecified organism: Secondary | ICD-10-CM

## 2018-01-09 MED ORDER — ENOXAPARIN SODIUM 40 MG/0.4ML ~~LOC~~ SOLN
40.00 | SUBCUTANEOUS | Status: DC
Start: 2018-01-09 — End: 2018-01-09

## 2018-01-09 MED ORDER — MEMANTINE HCL 10 MG PO TABS
10.00 | ORAL_TABLET | ORAL | Status: DC
Start: 2018-01-10 — End: 2018-01-09

## 2018-01-09 MED ORDER — RIVASTIGMINE 9.5 MG/24HR TD PT24
1.00 | MEDICATED_PATCH | TRANSDERMAL | Status: DC
Start: 2018-01-10 — End: 2018-01-09

## 2018-01-09 MED ORDER — SODIUM CHLORIDE 0.9 % IV SOLN
10.00 | INTRAVENOUS | Status: DC
Start: ? — End: 2018-01-09

## 2018-01-09 MED ORDER — ASPIRIN EC 81 MG PO TBEC
81.00 | DELAYED_RELEASE_TABLET | ORAL | Status: DC
Start: 2018-01-10 — End: 2018-01-09

## 2018-01-09 MED ORDER — ZOLPIDEM TARTRATE 5 MG PO TABS
5.00 | ORAL_TABLET | ORAL | Status: DC
Start: ? — End: 2018-01-09

## 2018-01-09 MED ORDER — SENNA-DOCUSATE SODIUM 8.6-50 MG PO TABS
1.00 | ORAL_TABLET | ORAL | Status: DC
Start: ? — End: 2018-01-09

## 2018-01-09 MED ORDER — GUAIFENESIN 100 MG/5ML PO SYRP
200.00 | ORAL_SOLUTION | ORAL | Status: DC
Start: ? — End: 2018-01-09

## 2018-01-09 MED ORDER — HYDROCHLOROTHIAZIDE 25 MG PO TABS
25.00 | ORAL_TABLET | ORAL | Status: DC
Start: 2018-01-10 — End: 2018-01-09

## 2018-01-09 MED ORDER — GENERIC EXTERNAL MEDICATION
1.00 | Status: DC
Start: ? — End: 2018-01-09

## 2018-01-09 MED ORDER — GENERIC EXTERNAL MEDICATION
4.00 | Status: DC
Start: ? — End: 2018-01-09

## 2018-01-09 MED ORDER — NITROGLYCERIN 0.4 MG SL SUBL
0.40 | SUBLINGUAL_TABLET | SUBLINGUAL | Status: DC
Start: ? — End: 2018-01-09

## 2018-01-09 MED ORDER — MORPHINE SULFATE (PF) 2 MG/ML IV SOLN
2.00 | INTRAVENOUS | Status: DC
Start: ? — End: 2018-01-09

## 2018-01-09 MED ORDER — ALUM & MAG HYDROXIDE-SIMETH 200-200-20 MG/5ML PO SUSP
30.00 | ORAL | Status: DC
Start: ? — End: 2018-01-09

## 2018-01-09 MED ORDER — KCL IN DEXTROSE-NACL 20-5-0.9 MEQ/L-%-% IV SOLN
50.00 | INTRAVENOUS | Status: DC
Start: ? — End: 2018-01-09

## 2018-01-09 MED ORDER — GENERIC EXTERNAL MEDICATION
650.00 | Status: DC
Start: ? — End: 2018-01-09

## 2018-01-09 MED ORDER — CLONIDINE HCL 0.1 MG PO TABS
.10 | ORAL_TABLET | ORAL | Status: DC
Start: ? — End: 2018-01-09

## 2018-01-09 MED ORDER — ACETAMINOPHEN 325 MG PO TABS
650.00 | ORAL_TABLET | ORAL | Status: DC
Start: ? — End: 2018-01-09

## 2018-01-09 MED ORDER — GABAPENTIN 100 MG PO CAPS
100.00 | ORAL_CAPSULE | ORAL | Status: DC
Start: 2018-01-10 — End: 2018-01-09

## 2018-01-09 MED ORDER — DOXYCYCLINE MONOHYDRATE 100 MG PO CAPS
100.00 | ORAL_CAPSULE | ORAL | Status: DC
Start: 2018-01-09 — End: 2018-01-09

## 2018-01-09 MED ORDER — GENERIC EXTERNAL MEDICATION
1.00 | Status: DC
Start: 2018-01-10 — End: 2018-01-09

## 2018-01-09 MED ORDER — GENERIC EXTERNAL MEDICATION
20.00 | Status: DC
Start: ? — End: 2018-01-09

## 2018-01-09 MED ORDER — LATANOPROST 0.005 % OP SOLN
1.00 | OPHTHALMIC | Status: DC
Start: 2018-01-09 — End: 2018-01-09

## 2018-01-09 MED ORDER — ALBUTEROL SULFATE (2.5 MG/3ML) 0.083% IN NEBU
2.50 | INHALATION_SOLUTION | RESPIRATORY_TRACT | Status: DC
Start: ? — End: 2018-01-09

## 2018-01-09 MED ORDER — GENERIC EXTERNAL MEDICATION
10.00 | Status: DC
Start: ? — End: 2018-01-09

## 2018-01-09 MED ORDER — CLOPIDOGREL BISULFATE 75 MG PO TABS
75.00 | ORAL_TABLET | ORAL | Status: DC
Start: 2018-01-10 — End: 2018-01-09

## 2018-01-09 MED ORDER — SERTRALINE HCL 50 MG PO TABS
75.00 | ORAL_TABLET | ORAL | Status: DC
Start: 2018-01-09 — End: 2018-01-09

## 2018-01-09 MED ORDER — HYDROCODONE-ACETAMINOPHEN 7.5-325 MG PO TABS
1.00 | ORAL_TABLET | ORAL | Status: DC
Start: ? — End: 2018-01-09

## 2018-01-09 NOTE — Telephone Encounter (Signed)
Was seen in the office today and was accompanied by his 2 daughters. One of the daughters is requesting that we send in a referral for home health. The patient lives at home alone and she is afraid that he is unable to take care of himself.  Please advise.

## 2018-01-09 NOTE — Patient Instructions (Signed)
Bradycardia, Adult Bradycardia is a slower-than-normal heartbeat. A normal resting heart rate for an adult ranges from 60 to 100 beats per minute. With bradycardia, the resting heart rate is less than 60 beats per minute. Bradycardia can prevent enough oxygen from reaching certain areas of your body when you are active. It can be serious if it keeps enough oxygen from reaching your brain and other parts of your body. Bradycardia is not a problem for everyone. For some healthy adults, a slow resting heart rate is normal. What are the causes? This condition may be caused by:  A problem with the heart, including: ? A problem with the heart's electrical system, such as a heart block. ? A problem with the heart's natural pacemaker (sinus node). ? Heart disease. ? A heart attack. ? Heart damage. ? A heart infection. ? A heart condition that is present at birth (congenital heart defect).  Certain medicines that treat heart conditions.  Certain conditions, such as hypothyroidism and obstructive sleep apnea.  Problems with the balance of chemicals and other substances, like potassium, in the blood.  What increases the risk? This condition is more likely to develop in adults who:  Are age 75 or older.  Have high blood pressure (hypertension), high cholesterol (hyperlipidemia), or diabetes.  Drink heavily, use tobacco or nicotine products, or use drugs.  Are stressed.  What are the signs or symptoms? Symptoms of this condition include:  Light-headedness.  Feeling faint or fainting.  Fatigue and weakness.  Shortness of breath.  Chest pain (angina).  Drowsiness.  Confusion.  Dizziness.  How is this diagnosed? This condition may be diagnosed based on:  Your symptoms.  Your medical history.  A physical exam.  During the exam, your health care provider will listen to your heartbeat and check your pulse. To confirm the diagnosis, your health care provider may order tests,  such as:  Blood tests.  An electrocardiogram (ECG). This test records the heart's electrical activity. The test can show how fast your heart is beating and whether the heartbeat is steady.  A test in which you wear a portable device (event recorder or Holter monitor) to record your heart's electrical activity while you go about your day.  Anexercise test.  How is this treated? Treatment for this condition depends on the cause of the condition and how severe your symptoms are. Treatment may involve:  Treatment of the underlying condition.  Changing your medicines or how much medicine you take.  Having a small, battery-operated device called a pacemaker implanted under the skin. When bradycardia occurs, this device can be used to increase your heart rate and help your heart to beat in a regular rhythm.  Follow these instructions at home: Lifestyle   Manage any health conditions that contribute to bradycardia as told by your health care provider.  Follow a heart-healthy diet. A nutrition specialist (dietitian) can help to educate you about healthy food options and changes.  Follow an exercise program that is approved by your health care provider.  Maintain a healthy weight.  Try to reduce or manage your stress, such as with yoga or meditation. If you need help reducing stress, ask your health care provider.  Do not use use any products that contain nicotine or tobacco, such as cigarettes and e-cigarettes. If you need help quitting, ask your health care provider.  Do not use illegal drugs.  Limit alcohol intake to no more than 1 drink per day for nonpregnant women and 2 drinks per  day for men. One drink equals 12 oz of beer, 5 oz of wine, or 1 oz of hard liquor. General instructions  Take over-the-counter and prescription medicines only as told by your health care provider.  Keep all follow-up visits as directed by your health care provider. This is important. How is this  prevented? In some cases, bradycardia may be prevented by:  Treating underlying medical problems.  Stopping behaviors or medicines that can trigger the condition.  Contact a health care provider if:  You feel light-headed or dizzy.  You almost faint.  You feel weak or are easily fatigued during physical activity.  You experience confusion or have memory problems. Get help right away if:  You faint.  You have an irregular heartbeat (palpitations).  You have chest pain.  You have trouble breathing. This information is not intended to replace advice given to you by your health care provider. Make sure you discuss any questions you have with your health care provider. Document Released: 02/10/2002 Document Revised: 01/17/2016 Document Reviewed: 11/10/2015 Elsevier Interactive Patient Education  2017 [employer]  Managing Your Hypertension Hypertension is commonly called high blood pressure. This is when the force of your blood pressing against the walls of your arteries is too strong. Arteries are blood vessels that carry blood from your heart throughout your body. Hypertension forces the heart to work harder to pump blood, and may cause the arteries to become narrow or stiff. Having untreated or uncontrolled hypertension can cause heart attack, stroke, kidney disease, and other problems. What are blood pressure readings? A blood pressure reading consists of a higher number over a lower number. Ideally, your blood pressure should be below 120/80. The first ("top") number is called the systolic pressure. It is a measure of the pressure in your arteries as your heart beats. The second ("bottom") number is called the diastolic pressure. It is a measure of the pressure in your arteries as the heart relaxes. What does my blood pressure reading mean? Blood pressure is classified into four stages. Based on your blood pressure reading, your health care provider may use the following stages  to determine what type of treatment you need, if any. Systolic pressure and diastolic pressure are measured in a unit called mm Hg. Normal  Systolic pressure: below 120.  Diastolic pressure: below 80. Elevated  Systolic pressure: 120-129.  Diastolic pressure: below 80. Hypertension stage 1  Systolic pressure: 130-139.  Diastolic pressure: 80-89. Hypertension stage 2  Systolic pressure: 140 or above.  Diastolic pressure: 90 or above. What health risks are associated with hypertension? Managing your hypertension is an important responsibility. Uncontrolled hypertension can lead to:  A heart attack.  A stroke.  A weakened blood vessel (aneurysm).  Heart failure.  Kidney damage.  Eye damage.  Metabolic syndrome.  Memory and concentration problems.  What changes can I make to manage my hypertension? Hypertension can be managed by making lifestyle changes and possibly by taking medicines. Your health care provider will help you make a plan to bring your blood pressure within a normal range. Eating and drinking  Eat a diet that is high in fiber and potassium, and low in salt (sodium), added sugar, and fat. An example eating plan is called the DASH (Dietary Approaches to Stop Hypertension) diet. To eat this way: ? Eat plenty of fresh fruits and vegetables. Try to fill half of your plate at each meal with fruits and vegetables. ? Eat whole grains, such as whole wheat pasta, brown rice,  or whole grain bread. Fill about one quarter of your plate with whole grains. ? Eat low-fat diary products. ? Avoid fatty cuts of meat, processed or cured meats, and poultry with skin. Fill about one quarter of your plate with lean proteins such as fish, chicken without skin, beans, eggs, and tofu. ? Avoid premade and processed foods. These tend to be higher in sodium, added sugar, and fat.  Reduce your daily sodium intake. Most people with hypertension should eat less than 1,500 mg of sodium  a day.  Limit alcohol intake to no more than 1 drink a day for nonpregnant women and 2 drinks a day for men. One drink equals 12 oz of beer, 5 oz of wine, or 1 oz of hard liquor. Lifestyle  Work with your health care provider to maintain a healthy body weight, or to lose weight. Ask what an ideal weight is for you.  Get at least 30 minutes of exercise that causes your heart to beat faster (aerobic exercise) most days of the week. Activities may include walking, swimming, or biking.  Include exercise to strengthen your muscles (resistance exercise), such as weight lifting, as part of your weekly exercise routine. Try to do these types of exercises for 30 minutes at least 3 days a week.  Do not use any products that contain nicotine or tobacco, such as cigarettes and e-cigarettes. If you need help quitting, ask your health care provider.  Control any long-term (chronic) conditions you have, such as high cholesterol or diabetes. Monitoring  Monitor your blood pressure at home as told by your health care provider. Your personal target blood pressure may vary depending on your medical conditions, your age, and other factors.  Have your blood pressure checked regularly, as often as told by your health care provider. Working with your health care provider  Review all the medicines you take with your health care provider because there may be side effects or interactions.  Talk with your health care provider about your diet, exercise habits, and other lifestyle factors that may be contributing to hypertension.  Visit your health care provider regularly. Your health care provider can help you create and adjust your plan for managing hypertension. Will I need medicine to control my blood pressure? Your health care provider may prescribe medicine if lifestyle changes are not enough to get your blood pressure under control, and if:  Your systolic blood pressure is 130 or higher.  Your diastolic  blood pressure is 80 or higher.  Take medicines only as told by your health care provider. Follow the directions carefully. Blood pressure medicines must be taken as prescribed. The medicine does not work as well when you skip doses. Skipping doses also puts you at risk for problems. Contact a health care provider if:  You think you are having a reaction to medicines you have taken.  You have repeated (recurrent) headaches.  You feel dizzy.  You have swelling in your ankles.  You have trouble with your vision. Get help right away if:  You develop a severe headache or confusion.  You have unusual weakness or numbness, or you feel faint.  You have severe pain in your chest or abdomen.  You vomit repeatedly.  You have trouble breathing. Summary  Hypertension is when the force of blood pumping through your arteries is too strong. If this condition is not controlled, it may put you at risk for serious complications.  Your personal target blood pressure may vary depending on your  medical conditions, your age, and other factors. For most people, a normal blood pressure is less than 120/80.  Hypertension is managed by lifestyle changes, medicines, or both. Lifestyle changes include weight loss, eating a healthy, low-sodium diet, exercising more, and limiting alcohol. This information is not intended to replace advice given to you by your health care provider. Make sure you discuss any questions you have with your health care provider. Document Released: 02/13/2012 Document Revised: 04/18/2016 Document Reviewed: 04/18/2016 Elsevier Interactive Patient Education  2018 [employer].  Syncope Syncope is when you lose temporarily pass out (faint). Signs that you may be about to pass out include:  Feeling dizzy or light-headed.  Feeling sick to your stomach (nauseous).  Seeing all white or all black.  Having cold, clammy skin.  If you passed out, get help right away. Call your local  emergency services (911 in the [place] Do not drive yourself to the hospital. Follow these instructions at home: Pay attention to any changes in your symptoms. Take these actions to help with your condition:  Have someone stay with you until you feel stable.  Do not drive, use machinery, or play sports until your doctor says it is okay.  Keep all follow-up visits as told by your doctor. This is important.  If you start to feel like you might pass out, lie down right away and raise (elevate) your feet above the level of your heart. Breathe deeply and steadily. Wait until all of the symptoms are gone.  Drink enough fluid to keep your pee (urine) clear or pale yellow.  If you are taking blood pressure or heart medicine, get up slowly and spend many minutes getting ready to sit and then stand. This can help with dizziness.  Take over-the-counter and prescription medicines only as told by your doctor.  Get help right away if:  You have a very bad headache.  You have unusual pain in your chest, tummy, or back.  You are bleeding from your mouth or rectum.  You have black or tarry poop (stool).  You have a very fast or uneven heartbeat (palpitations).  It hurts to breathe.  You pass out once or more than once.  You have jerky movements that you cannot control (seizure).  You are confused.  You have trouble walking.  You are very weak.  You have vision problems. These symptoms may be an emergency. Do not wait to see if the symptoms will go away. Get medical help right away. Call your local emergency services (911 in the [place] Do not drive yourself to the hospital. This information is not intended to replace advice given to you by your health care provider. Make sure you discuss any questions you have with your health care provider. Document Released: 11/07/2007 Document Revised: 10/27/2015 Document Reviewed: 02/02/2015 Elsevier Interactive Patient Education  [employer].

## 2018-01-09 NOTE — Progress Notes (Signed)
Patient presents to clinic today to establish care.  Pt accompanied by his daughter.  Another daughter came to appt during [place].  SUBJECTIVE: PMH: pt is an 84 yo male with pmh sig for dementia, aortic stenosis, PAF, retinal artery occlusion, HTN, h/o CVA, aspiration pna, syncope.  Pt was previously seen in [place], [place].  Was living with his wife? To start living in [place] with daughter.  Upon arrival of second daughter, she demands pt be started on medication for dementia and several forms be completed.  Difficult obtaining hx as question of dementia in pt and 1 st daughter was unsure of pt's meds.     Recently hospitalized in [place] for community-acquired pneumonia 8/7-01/09/18.  Pt was d/c at his daughter's request so he could make appt with this provider per OSH notes.  During his hospitalization he was found to have moderate to severe Aortic stenosis and afib, EF >55%. Cardiology recommended anticoagulation.  Further Cardiac w/up (holter/cath/tee) was deferred until pt est care with Cardiology in [place].  CT head w/o contrast with diffuse chronic white matter ischemic changes.  Pt d/c'd with rx for levaquin 750 mg x 5 days and HCTZ.  Blindness in right eye: -Per chart review from hospitalization on 12/04/2017 2/2 central retinal vein occlusion -Patient's former ophthalmologist in [place], Dr. Jonna Coup -Patient has no light perception in right eye   Past Medical History:  Diagnosis Date  . Hypertension   . Stroke [facility])    5 years ago    Past Surgical History:  Procedure Laterality Date  . EYE SURGERY      Current Outpatient Medications on File Prior to Visit  Medication Sig Dispense Refill  . amLODipine (NORVASC) 5 MG tablet Take 1 tablet (5 mg total) by mouth at bedtime. 30 tablet 0  . atorvastatin (LIPITOR) 40 MG tablet Take 1 tablet (40 mg total) by mouth daily at 6 PM. 30 tablet 0  . brimonidine (ALPHAGAN) 0.2 % ophthalmic solution Place 1 drop into both eyes 2  (two) times daily.    . clopidogrel (PLAVIX) 75 MG tablet Take 1 tablet (75 mg total) by mouth daily. 30 tablet 0  . gabapentin (NEURONTIN) 100 MG capsule Take 1 capsule (100 mg total) by mouth daily. 30 capsule 0  . risperiDONE (RISPERDAL) 1 MG tablet Take 1.5 tablets (1.5 mg total) by mouth 2 (two) times daily. 32 tablet 0  . sertraline (ZOLOFT) 25 MG tablet Take 25 mg by mouth at bedtime.    . timolol (BETIMOL) 0.5 % ophthalmic solution Place 1 drop into both eyes 2 (two) times daily.    . traZODone (DESYREL) 50 MG tablet Take 1 tablet (50 mg total) by mouth at bedtime. 21 tablet 0  . acetaminophen (TYLENOL) 325 MG tablet Take 2 tablets (650 mg total) by mouth every 6 (six) hours as needed for mild pain (or Fever >/= 101). (Patient not taking: Reported on 01/09/2018) 30 tablet 0  . aspirin EC 81 MG tablet Take 1 tablet (81 mg total) by mouth daily. (Patient not taking: Reported on 01/09/2018) 30 tablet 2   No current facility-administered medications on file prior to visit.     No Known Allergies  Family History  Problem Relation Age of Onset  . CAD Father     Social History   Socioeconomic History  . Marital status: Single    Spouse name: Not on file  . Number of children: Not on file  . Years of education: Not on file  .  Highest education level: Not on file  Occupational History  . Not on file  Social Needs  . Financial resource strain: Not on file  . Food insecurity:    Worry: Not on file    Inability: Not on file  . Transportation needs:    Medical: Not on file    Non-medical: Not on file  Tobacco Use  . Smoking status: Never Smoker  . Smokeless tobacco: Never Used  Substance and Sexual Activity  . Alcohol use: Never    Frequency: Never  . Drug use: Never  . Sexual activity: Not Currently  Lifestyle  . Physical activity:    Days per week: Not on file    Minutes per session: Not on file  . Stress: Not on file  Relationships  . Social connections:    Talks on  phone: Not on file    Gets together: Not on file    Attends religious service: Not on file    Active member of club or organization: Not on file    Attends meetings of clubs or organizations: Not on file    Relationship status: Not on file  . Intimate partner violence:    Fear of current or ex partner: Not on file    Emotionally abused: Not on file    Physically abused: Not on file    Forced sexual activity: Not on file  Other Topics Concern  . Not on file  Social History Narrative  . Not on file    ROS General: Denies fever, chills, night sweats, changes in weight, changes in appetite HEENT: Denies headaches, ear pain, changes in vision, rhinorrhea, sore throat  +blind in R eye CV: Denies CP, palpitations, SOB, orthopnea Pulm: Denies SOB, wheezing  +cough GI: Denies abdominal pain, nausea, vomiting, diarrhea, constipation GU: Denies dysuria, hematuria, frequency, vaginal discharge Msk: Denies muscle cramps, joint pains Neuro: Denies weakness, numbness, tingling Skin: Denies rashes, bruising Psych: Denies depression, anxiety, hallucinations  BP 122/80 (BP Location: Left Arm, Patient Position: Sitting, Cuff Size: Normal)   Pulse (!) 59   Temp 98.5 F (36.9 C) (Oral)   Wt 201 lb 8 oz (91.4 kg)   SpO2 98%   BMI 29.76 kg/m   Physical Exam Gen. Pleasant, well developed, well-nourished, in NAD HEENT - [place]/AT, PERRL, conjunctive clear, blind in right eye.  No scleral icterus, no nasal drainage, poor detention, pharynx without erythema or exudate. Lungs: no use of accessory muscles, CTAB, no wheezes, rales or rhonchi Cardiovascular: RRR, murmur best heard right upper sternal border and left upper sternal border, no peripheral edema Abdomen: BS present, soft, nontender, nondistended  Neuro:  A&Ox3, CN II-XII intact, gait not assessed as sitting in wheelchair. Skin:  Warm, dry, intact, no lesions  Recent Results (from the past 2160 hour(s))  Urinalysis, Routine w reflex  microscopic     Status: Abnormal   Collection Time: 12/04/17  9:44 PM  Result Value Ref Range   Color, Urine AMBER (A) YELLOW    Comment: BIOCHEMICALS MAY BE AFFECTED BY COLOR   APPearance CLEAR CLEAR   Specific Gravity, Urine 1.018 1.005 - 1.030   pH 6.0 5.0 - 8.0   Glucose, UA NEGATIVE NEGATIVE mg/dL   Hgb urine dipstick NEGATIVE NEGATIVE   Bilirubin Urine NEGATIVE NEGATIVE   Ketones, ur NEGATIVE NEGATIVE mg/dL   Protein, ur NEGATIVE NEGATIVE mg/dL   Nitrite NEGATIVE NEGATIVE   Leukocytes, UA NEGATIVE NEGATIVE    Comment: Performed at [facility]  [address] [place], [place] [address]  CBG monitoring, ED     Status: None   Collection Time: 12/04/17  9:44 PM  Result Value Ref Range   Glucose-Capillary 95 70 - 99 mg/dL  Basic metabolic panel     Status: Abnormal   Collection Time: 12/04/17 11:21 PM  Result Value Ref Range   Sodium 139 135 - 145 mmol/L   Potassium 3.8 3.5 - 5.1 mmol/L   Chloride 107 98 - 111 mmol/L    Comment: Please note change in reference range.   CO2 25 22 - 32 mmol/L   Glucose, Bld 111 (H) 70 - 99 mg/dL    Comment: Please note change in reference range.   BUN 7 (L) 8 - 23 mg/dL    Comment: Please note change in reference range.   Creatinine, Ser 1.04 0.61 - 1.24 mg/dL   Calcium 8.4 (L) 8.9 - 10.3 mg/dL   GFR calc non Af Amer >60 >60 mL/min   GFR calc Af Amer >60 >60 mL/min    Comment: (NOTE) The eGFR has been calculated using the CKD EPI equation. This calculation has not been validated in all clinical situations. eGFR's persistently <60 mL/min signify possible Chronic Kidney Disease.    Anion gap 7 5 - 15    Comment: Performed at [facility] [address], [place] [address]  CBC     Status: None   Collection Time: 12/04/17 11:21 PM  Result Value Ref Range   WBC 8.9 4.0 - 10.5 K/uL   RBC 4.54 4.22 - 5.81 MIL/uL   Hemoglobin 13.1 13.0 - 17.0 g/dL   HCT 41.5 39.0 - 52.0 %   MCV 91.4 78.0 - 100.0 fL   MCH 28.9 26.0 -  34.0 pg   MCHC 31.6 30.0 - 36.0 g/dL   RDW 14.0 11.5 - 15.5 %   Platelets 196 150 - 400 K/uL    Comment: Performed at [facility], [place] [address], [place], [place] [address]  Magnesium     Status: None   Collection Time: 12/04/17 11:21 PM  Result Value Ref Range   Magnesium 2.1 1.7 - 2.4 mg/dL    Comment: Performed at [facility], [place] [address], [place], [place] [address]  Brain natriuretic peptide     Status: Abnormal   Collection Time: 12/04/17 11:21 PM  Result Value Ref Range   B Natriuretic Peptide 172.2 (H) 0.0 - 100.0 pg/mL    Comment: Performed at [facility] [address], [place], [place] [address]  Hepatic function panel     Status: Abnormal   Collection Time: 12/04/17 11:21 PM  Result Value Ref Range   Total Protein 6.0 (L) 6.5 - 8.1 g/dL   Albumin 2.9 (L) 3.5 - 5.0 g/dL   AST 19 15 - 41 U/L   ALT 13 0 - 44 U/L    Comment: Please note change in reference range.   Alkaline Phosphatase 78 38 - 126 U/L   Total Bilirubin 1.4 (H) 0.3 - 1.2 mg/dL   Bilirubin, Direct 0.3 (H) 0.0 - 0.2 mg/dL    Comment: Please note change in reference range.   Indirect Bilirubin 1.1 (H) 0.3 - 0.9 mg/dL    Comment: Performed at [facility], [place] [address] [place], [place] [address]  I-stat troponin, ED     Status: None   Collection Time: 12/04/17 11:25 PM  Result Value Ref Range   Troponin i,  poc 0.00 0.00 - 0.08 ng/mL   Comment 3            Comment: Due to the release kinetics of cTnI, a negative result within the first hours of the onset of symptoms does not rule out myocardial infarction with certainty. If myocardial infarction is still suspected, repeat the test at appropriate intervals.   I-stat troponin, ED     Status: None   Collection Time: 12/05/17  1:02 AM  Result Value Ref Range   Troponin i, poc 0.01 0.00 - 0.08 ng/mL   Comment 3            Comment: Due to the release kinetics of cTnI, a negative result within the first hours of the onset  of symptoms does not rule out myocardial infarction with certainty. If myocardial infarction is still suspected, repeat the test at appropriate intervals.   Urine culture     Status: Abnormal   Collection Time: 12/05/17  4:30 AM  Result Value Ref Range   Specimen Description URINE, RANDOM    Special Requests NONE    Culture (A)     40,000 COLONIES/mL GROUP B STREP(S.AGALACTIAE)ISOLATED TESTING AGAINST S. AGALACTIAE NOT ROUTINELY PERFORMED DUE TO PREDICTABILITY OF AMP/PEN/VAN SUSCEPTIBILITY. Performed at [facility], [place] [address], [place], [place] [address]    Report Status 12/06/2017 FINAL   Strep pneumoniae urinary antigen     Status: None   Collection Time: 12/05/17  4:30 AM  Result Value Ref Range   Strep Pneumo Urinary Antigen NEGATIVE NEGATIVE    Comment:        Infection due to S. pneumoniae cannot be absolutely ruled out since the antigen present may be below the detection limit of the test. Performed at [facility], [place] [address] [place], [place] [address]   Legionella Pneumophila Serogp 1 Ur Ag     Status: None   Collection Time: 12/05/17  4:30 AM  Result Value Ref Range   L. pneumophila Serogp 1 Ur Ag Negative Negative    Comment: (NOTE) Presumptive negative for L. pneumophila serogroup 1 antigen in urine, suggesting no recent or current infection. Legionnaires' disease cannot be ruled out since other serogroups and species may also cause disease. Performed At: [facility] [place], [place] [id number] Rush Farmer MD [id number]    Source of Sample URINE     Comment: Performed at [facility], [place] [address], [place], [place] [address]  Culture, blood (routine x 2) Call MD if unable to obtain prior to antibiotics being given     Status: None   Collection Time: 12/05/17  4:45 AM  Result Value Ref Range   Specimen Description BLOOD RIGHT HAND    Special Requests      BOTTLES DRAWN AEROBIC ONLY Blood Culture  adequate volume   Culture      NO GROWTH 5 DAYS Performed at [facility] [address], [place], [place] [address]    Report Status 12/10/2017 FINAL   Culture, blood (routine x 2) Call MD if unable to obtain prior to antibiotics being given     Status: None   Collection Time: 12/05/17  4:50 AM  Result Value Ref Range   Specimen Description BLOOD RIGHT HAND    Special Requests      BOTTLES DRAWN AEROBIC ONLY Blood Culture adequate volume   Culture      NO GROWTH 5 DAYS Performed at [facility],  [address] [place], [place] [address]    Report Status 12/10/2017 FINAL   Comprehensive metabolic panel     Status: Abnormal   Collection Time: 12/05/17  4:54 AM  Result Value Ref Range   Sodium 142 135 - 145 mmol/L   Potassium 3.8 3.5 - 5.1 mmol/L   Chloride 106 98 - 111 mmol/L    Comment: Please note change in reference range.   CO2 26 22 - 32 mmol/L   Glucose, Bld 92 70 - 99 mg/dL    Comment: Please note change in reference range.   BUN 9 8 - 23 mg/dL    Comment: Please note change in reference range.   Creatinine, Ser 1.06 0.61 - 1.24 mg/dL   Calcium 8.5 (L) 8.9 - 10.3 mg/dL   Total Protein 5.9 (L) 6.5 - 8.1 g/dL   Albumin 2.8 (L) 3.5 - 5.0 g/dL   AST 20 15 - 41 U/L   ALT 13 0 - 44 U/L    Comment: Please note change in reference range.   Alkaline Phosphatase 75 38 - 126 U/L   Total Bilirubin 1.6 (H) 0.3 - 1.2 mg/dL   GFR calc non Af Amer >60 >60 mL/min   GFR calc Af Amer >60 >60 mL/min    Comment: (NOTE) The eGFR has been calculated using the CKD EPI equation. This calculation has not been validated in all clinical situations. eGFR's persistently <60 mL/min signify possible Chronic Kidney Disease.    Anion gap 10 5 - 15    Comment: Performed at [facility] [address], [place], [place] [address]  CBC WITH DIFFERENTIAL     Status: Abnormal   Collection Time: 12/05/17  4:54 AM  Result Value Ref Range   WBC 7.9 4.0 - 10.5 K/uL   RBC 4.33 4.22  - 5.81 MIL/uL   Hemoglobin 12.4 (L) 13.0 - 17.0 g/dL   HCT 39.0 39.0 - 52.0 %   MCV 90.1 78.0 - 100.0 fL   MCH 28.6 26.0 - 34.0 pg   MCHC 31.8 30.0 - 36.0 g/dL   RDW 14.1 11.5 - 15.5 %   Platelets 177 150 - 400 K/uL   Neutrophils Relative % 82 %   Neutro Abs 6.5 1.7 - 7.7 K/uL   Lymphocytes Relative 12 %   Lymphs Abs 0.9 0.7 - 4.0 K/uL   Monocytes Relative 6 %   Monocytes Absolute 0.5 0.1 - 1.0 K/uL   Eosinophils Relative 0 %   Eosinophils Absolute 0.0 0.0 - 0.7 K/uL   Basophils Relative 0 %   Basophils Absolute 0.0 0.0 - 0.1 K/uL   Immature Granulocytes 0 %   Abs Immature Granulocytes 0.0 0.0 - 0.1 K/uL    Comment: Performed at [facility] [address], [place], [place] [address]  Troponin I (q 6hr x 3)     Status: None   Collection Time: 12/05/17  4:54 AM  Result Value Ref Range   Troponin I <0.03 <0.03 ng/mL    Comment: Performed at [facility] [address] [place], [place] [address]  HIV antibody (Routine Screening)     Status: None   Collection Time: 12/05/17  4:54 AM  Result Value Ref Range   HIV Screen 4th Generation wRfx Non Reactive Non Reactive    Comment: (NOTE) Performed At: [facility] [place], [place] [id number] Rush Farmer MD [id number] Performed at [facility], [place],  [place] [address]   TSH     Status: None   Collection Time: 12/05/17  4:54 AM  Result Value Ref Range   TSH 1.712 0.350 - 4.500 uIU/mL    Comment: Performed by a 3rd Generation assay with a functional sensitivity of <=0.01 uIU/mL. Performed at [facility], [place] [address] [place], [place] [address]   Troponin I (q 6hr x 3)     Status: None   Collection Time: 12/05/17 10:34 AM  Result Value Ref Range   Troponin I <0.03 <0.03 ng/mL    Comment: Performed at [facility] [address], [place], [place] [address]  ECHOCARDIOGRAM COMPLETE     Status: None   Collection Time: 12/05/17  1:20 PM  Result  Value Ref Range   Weight 4,000 oz   Height 69 in   BP 129/104 mmHg  Troponin I (q 6hr x 3)     Status: None   Collection Time: 12/05/17  7:10 PM  Result Value Ref Range   Troponin I <0.03 <0.03 ng/mL    Comment: Performed at [facility], [place] [address] [place], [place] [address]  Basic metabolic panel     Status: Abnormal   Collection Time: 12/06/17  6:17 AM  Result Value Ref Range   Sodium 140 135 - 145 mmol/L   Potassium 3.6 3.5 - 5.1 mmol/L   Chloride 106 98 - 111 mmol/L    Comment: Please note change in reference range.   CO2 26 22 - 32 mmol/L   Glucose, Bld 96 70 - 99 mg/dL    Comment: Please note change in reference range.   BUN 9 8 - 23 mg/dL    Comment: Please note change in reference range.   Creatinine, Ser 1.10 0.61 - 1.24 mg/dL   Calcium 8.7 (L) 8.9 - 10.3 mg/dL   GFR calc non Af Amer >60 >60 mL/min   GFR calc Af Amer >60 >60 mL/min    Comment: (NOTE) The eGFR has been calculated using the CKD EPI equation. This calculation has not been validated in all clinical situations. eGFR's persistently <60 mL/min signify possible Chronic Kidney Disease.    Anion gap 8 5 - 15    Comment: Performed at [facility] [address] [place], [place] [address]  Hemoglobin A1c     Status: None   Collection Time: 12/06/17  6:17 AM  Result Value Ref Range   Hgb A1c MFr Bld 5.6 4.8 - 5.6 %    Comment: (NOTE) Pre diabetes:          5.7%-6.4% Diabetes:              >6.4% Glycemic control for   <7.0% adults with diabetes    Mean Plasma Glucose 114.02 mg/dL    Comment: Performed at [facility] [address], [place], [place] [address]  Lipid panel     Status: Abnormal   Collection Time: 12/06/17  6:17 AM  Result Value Ref Range   Cholesterol 239 (H) 0 - 200 mg/dL   Triglycerides 74 <150 mg/dL   HDL 44 >40 mg/dL   Total CHOL/HDL Ratio 5.4 RATIO   VLDL 15 0 - 40 mg/dL   LDL Cholesterol 180 (H) 0 - 99 mg/dL    Comment:        Total Cholesterol/HDL:CHD  Risk Coronary Heart Disease Risk Table  Men   Women  1/2 Average Risk   3.4   3.3  Average Risk       5.0   4.4  2 X Average Risk   9.6   7.1  3 X Average Risk  23.4   11.0        Use the calculated Patient Ratio above and the CHD Risk Table to determine the patient's CHD Risk.        ATP III CLASSIFICATION (LDL):  <100     mg/dL   Optimal  100-129  mg/dL   Near or Above                    Optimal  130-159  mg/dL   Borderline  160-189  mg/dL   High  >190     mg/dL   Very High Performed at [facility] [address] [place], [place] [address]   Sedimentation rate     Status: Abnormal   Collection Time: 12/06/17 10:24 AM  Result Value Ref Range   Sed Rate 29 (H) 0 - 16 mm/hr    Comment: Performed at [facility] [address], [place], [place] [address]  C-reactive protein     Status: Abnormal   Collection Time: 12/06/17 10:24 AM  Result Value Ref Range   CRP 5.5 (H) <1.0 mg/dL    Comment: Performed at [facility] [address], [place],  [address]  Procalcitonin - Baseline     Status: None   Collection Time: 12/06/17 10:24 AM  Result Value Ref Range   Procalcitonin 0.28 ng/mL    Comment:        Interpretation: PCT (Procalcitonin) <= 0.5 ng/mL: Systemic infection (sepsis) is not likely. Local bacterial infection is possible. (NOTE)       Sepsis PCT Algorithm           Lower Respiratory Tract                                      Infection PCT Algorithm    ----------------------------     ----------------------------         PCT < 0.25 ng/mL                PCT < 0.10 ng/mL         Strongly encourage             Strongly discourage   discontinuation of antibiotics    initiation of antibiotics    ----------------------------     -----------------------------       PCT 0.25 - 0.50 ng/mL            PCT 0.10 - 0.25 ng/mL               OR       >80% decrease in PCT            Discourage initiation of                                             antibiotics      Encourage discontinuation           of antibiotics    ----------------------------     -----------------------------  PCT >= 0.50 ng/mL              PCT 0.26 - 0.50 ng/mL               AND        <80% decrease in PCT             Encourage initiation of                                             antibiotics       Encourage continuation           of antibiotics    ----------------------------     -----------------------------        PCT >= 0.50 ng/mL                  PCT > 0.50 ng/mL               AND         increase in PCT                  Strongly encourage                                      initiation of antibiotics    Strongly encourage escalation           of antibiotics                                     -----------------------------                                           PCT <= 0.25 ng/mL                                                 OR                                        > 80% decrease in PCT                                     Discontinue / Do not initiate                                             antibiotics Performed at [facility], [address] [place],  [address]   Basic metabolic panel     Status: Abnormal   Collection Time: 12/07/17  7:05 AM  Result Value Ref Range   Sodium 141 135 - 145 mmol/L   Potassium 3.3 (L) 3.5 - 5.1 mmol/L   Chloride 104 98 - 111 mmol/L    Comment:  Please note change in reference range.   CO2 25 22 - 32 mmol/L   Glucose, Bld 104 (H) 70 - 99 mg/dL    Comment: Please note change in reference range.   BUN 7 (L) 8 - 23 mg/dL    Comment: Please note change in reference range.   Creatinine, Ser 0.96 0.61 - 1.24 mg/dL   Calcium 8.8 (L) 8.9 - 10.3 mg/dL   GFR calc non Af Amer >60 >60 mL/min   GFR calc Af Amer >60 >60 mL/min    Comment: (NOTE) The eGFR has been calculated using the CKD EPI equation. This calculation has not been validated in all clinical  situations. eGFR's persistently <60 mL/min signify possible Chronic Kidney Disease.    Anion gap 12 5 - 15    Comment: Performed at [facility] [address] [place], [place] [address]  C-reactive protein     Status: Abnormal   Collection Time: 12/07/17  7:05 AM  Result Value Ref Range   CRP 6.1 (H) <1.0 mg/dL    Comment: Performed at [facility] [address] [place], [place] [address]  Procalcitonin - Baseline     Status: None   Collection Time: 12/07/17  7:05 AM  Result Value Ref Range   Procalcitonin 0.20 ng/mL    Comment:        Interpretation: PCT (Procalcitonin) <= 0.5 ng/mL: Systemic infection (sepsis) is not likely. Local bacterial infection is possible. (NOTE)       Sepsis PCT Algorithm           Lower Respiratory Tract                                      Infection PCT Algorithm    ----------------------------     ----------------------------         PCT < 0.25 ng/mL                PCT < 0.10 ng/mL         Strongly encourage             Strongly discourage   discontinuation of antibiotics    initiation of antibiotics    ----------------------------     -----------------------------       PCT 0.25 - 0.50 ng/mL            PCT 0.10 - 0.25 ng/mL               OR       >80% decrease in PCT            Discourage initiation of                                            antibiotics      Encourage discontinuation           of antibiotics    ----------------------------     -----------------------------         PCT >= 0.50 ng/mL              PCT 0.26 - 0.50 ng/mL               AND        <80% decrease in PCT  Encourage initiation of                                             antibiotics       Encourage continuation           of antibiotics    ----------------------------     -----------------------------        PCT >= 0.50 ng/mL                  PCT > 0.50 ng/mL               AND         increase in PCT                  Strongly  encourage                                      initiation of antibiotics    Strongly encourage escalation           of antibiotics                                     -----------------------------                                           PCT <= 0.25 ng/mL                                                 OR                                        > 80% decrease in PCT                                     Discontinue / Do not initiate                                             antibiotics Performed at [facility], [address] [place], [place] [address]   Sedimentation rate     Status: Abnormal   Collection Time: 12/07/17  7:05 AM  Result Value Ref Range   Sed Rate 46 (H) 0 - 16 mm/hr    Comment: Performed at [facility] [address] [place] [address]  CBC     Status: None   Collection Time: 12/07/17  7:05 AM  Result Value Ref Range   WBC 5.2 4.0 - 10.5 K/uL   RBC 4.51 4.22 - 5.81 MIL/uL   Hemoglobin 13.0 13.0 - 17.0 g/dL   HCT 40.0 39.0 - 52.0 %   MCV 88.7 78.0 - 100.0 fL  MCH 28.8 26.0 - 34.0 pg   MCHC 32.5 30.0 - 36.0 g/dL   RDW 13.7 11.5 - 15.5 %   Platelets 176 150 - 400 K/uL    Comment: Performed at [facility], [place] [address] [place], [place] [address]  Basic metabolic panel     Status: Abnormal   Collection Time: 12/08/17  6:24 AM  Result Value Ref Range   Sodium 143 135 - 145 mmol/L   Potassium 3.4 (L) 3.5 - 5.1 mmol/L   Chloride 110 98 - 111 mmol/L    Comment: Please note change in reference range.   CO2 26 22 - 32 mmol/L   Glucose, Bld 100 (H) 70 - 99 mg/dL    Comment: Please note change in reference range.   BUN 7 (L) 8 - 23 mg/dL    Comment: Please note change in reference range.   Creatinine, Ser 0.93 0.61 - 1.24 mg/dL   Calcium 8.4 (L) 8.9 - 10.3 mg/dL   GFR calc non Af Amer >60 >60 mL/min   GFR calc Af Amer >60 >60 mL/min    Comment: (NOTE) The eGFR has been calculated using the CKD EPI equation. This calculation has  not been validated in all clinical situations. eGFR's persistently <60 mL/min signify possible Chronic Kidney Disease.    Anion gap 7 5 - 15    Comment: Performed at [facility] [address], [place], [place] [address]  CBC     Status: Abnormal   Collection Time: 12/08/17  6:24 AM  Result Value Ref Range   WBC 4.1 4.0 - 10.5 K/uL   RBC 4.19 (L) 4.22 - 5.81 MIL/uL   Hemoglobin 11.9 (L) 13.0 - 17.0 g/dL   HCT 37.8 (L) 39.0 - 52.0 %   MCV 90.2 78.0 - 100.0 fL   MCH 28.4 26.0 - 34.0 pg   MCHC 31.5 30.0 - 36.0 g/dL   RDW 14.3 11.5 - 15.5 %   Platelets 175 150 - 400 K/uL    Comment: Performed at [facility], [place] [address] [place], [place] [address]  Basic metabolic panel     Status: Abnormal   Collection Time: 12/09/17  5:20 AM  Result Value Ref Range   Sodium 143 135 - 145 mmol/L   Potassium 3.1 (L) 3.5 - 5.1 mmol/L   Chloride 108 98 - 111 mmol/L    Comment: Please note change in reference range.   CO2 28 22 - 32 mmol/L   Glucose, Bld 91 70 - 99 mg/dL    Comment: Please note change in reference range.   BUN 9 8 - 23 mg/dL    Comment: Please note change in reference range.   Creatinine, Ser 0.98 0.61 - 1.24 mg/dL   Calcium 8.4 (L) 8.9 - 10.3 mg/dL   GFR calc non Af Amer >60 >60 mL/min   GFR calc Af Amer >60 >60 mL/min    Comment: (NOTE) The eGFR has been calculated using the CKD EPI equation. This calculation has not been validated in all clinical situations. eGFR's persistently <60 mL/min signify possible Chronic Kidney Disease.    Anion gap 7 5 - 15    Comment: Performed at [facility] [address], [place] [address]  CBC     Status: Abnormal   Collection Time: 12/09/17  5:20 AM  Result Value Ref Range   WBC 4.7 4.0 - 10.5 K/uL   RBC 4.15 (L) 4.22 - 5.81  MIL/uL   Hemoglobin 12.0 (L) 13.0 - 17.0 g/dL   HCT 37.6 (L) 39.0 - 52.0 %   MCV 90.6 78.0 - 100.0 fL   MCH 28.9 26.0 - 34.0 pg   MCHC 31.9 30.0 - 36.0 g/dL   RDW 14.3 11.5 - 15.5  %   Platelets 174 150 - 400 K/uL    Comment: Performed at [facility] [address], [place], [place] [address]    Assessment/Plan: Community acquired pneumonia of right lower lobe of lung ([place]) -Stable -Continue Levaquin -If patient becomes worse advised to proceed to nearest ED.  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  Will likely need referral.  Nonrheumatic aortic valve stenosis -Continue Plavix 75 mg daily. -Discussed bleeding risk 2/2 pt's fall risk - Plan: Ambulatory referral to Cardiology  Vascular dementia without behavioral disturbance -Likely 2/2 extensive atherosclerosis on MRA of head -Family advised donepezil and Namenda not appropriate given patient's history of bradycardia (noted Hospitalization 01/08/18) -Continue risperidone 1 mg, trazodone 50 mg, sertraline 25 mg -Consider follow-up with psychiatry  Establish care -We reviewed the PMH, PSH, FH, SH, Meds and Allergies. -We provided refills for any medications we will prescribe as needed. -We addressed current concerns per orders and patient instructions. -We have asked for records for pertinent exams, studies, vaccines and notes from previous providers. -We have advised patient to follow up per instructions below.  Follow-up PRN  No forms completed at this visit.  Grier Mitts, MD

## 2018-01-10 ENCOUNTER — Encounter: Payer: Self-pay | Admitting: Family Medicine

## 2018-01-11 ENCOUNTER — Inpatient Hospital Stay (HOSPITAL_COMMUNITY)
Admission: EM | Admit: 2018-01-11 | Discharge: 2018-01-21 | DRG: 312 | Disposition: A | Discharge status: Skilled Nursing Facility | Payer: Medicare Other | Attending: Internal Medicine | Admitting: Internal Medicine

## 2018-01-11 ENCOUNTER — Encounter (HOSPITAL_COMMUNITY): Payer: Self-pay

## 2018-01-11 ENCOUNTER — Emergency Department (HOSPITAL_COMMUNITY): Payer: Medicare Other

## 2018-01-11 ENCOUNTER — Other Ambulatory Visit: Payer: Self-pay

## 2018-01-11 ENCOUNTER — Observation Stay (HOSPITAL_COMMUNITY): Payer: Medicare Other

## 2018-01-11 DIAGNOSIS — I69354 Hemiplegia and hemiparesis following cerebral infarction affecting left non-dominant side: Secondary | ICD-10-CM

## 2018-01-11 DIAGNOSIS — I110 Hypertensive heart disease with heart failure: Secondary | ICD-10-CM | POA: Diagnosis present

## 2018-01-11 DIAGNOSIS — I951 Orthostatic hypotension: Principal | ICD-10-CM | POA: Diagnosis present

## 2018-01-11 DIAGNOSIS — J181 Lobar pneumonia, unspecified organism: Secondary | ICD-10-CM | POA: Diagnosis present

## 2018-01-11 DIAGNOSIS — R404 Transient alteration of awareness: Secondary | ICD-10-CM

## 2018-01-11 DIAGNOSIS — E860 Dehydration: Secondary | ICD-10-CM | POA: Diagnosis present

## 2018-01-11 DIAGNOSIS — E861 Hypovolemia: Secondary | ICD-10-CM

## 2018-01-11 DIAGNOSIS — I509 Heart failure, unspecified: Secondary | ICD-10-CM | POA: Diagnosis present

## 2018-01-11 DIAGNOSIS — R0902 Hypoxemia: Secondary | ICD-10-CM | POA: Diagnosis not present

## 2018-01-11 DIAGNOSIS — I10 Essential (primary) hypertension: Secondary | ICD-10-CM | POA: Diagnosis present

## 2018-01-11 DIAGNOSIS — I959 Hypotension, unspecified: Secondary | ICD-10-CM

## 2018-01-11 DIAGNOSIS — I350 Nonrheumatic aortic (valve) stenosis: Secondary | ICD-10-CM

## 2018-01-11 DIAGNOSIS — F0390 Unspecified dementia without behavioral disturbance: Secondary | ICD-10-CM | POA: Diagnosis present

## 2018-01-11 DIAGNOSIS — R55 Syncope and collapse: Secondary | ICD-10-CM | POA: Diagnosis present

## 2018-01-11 DIAGNOSIS — Z7982 Long term (current) use of aspirin: Secondary | ICD-10-CM

## 2018-01-11 DIAGNOSIS — I441 Atrioventricular block, second degree: Secondary | ICD-10-CM | POA: Diagnosis present

## 2018-01-11 DIAGNOSIS — J189 Pneumonia, unspecified organism: Secondary | ICD-10-CM

## 2018-01-11 DIAGNOSIS — Z8249 Family history of ischemic heart disease and other diseases of the circulatory system: Secondary | ICD-10-CM

## 2018-01-11 DIAGNOSIS — I4581 Long QT syndrome: Secondary | ICD-10-CM | POA: Diagnosis present

## 2018-01-11 DIAGNOSIS — G9340 Encephalopathy, unspecified: Secondary | ICD-10-CM | POA: Diagnosis present

## 2018-01-11 DIAGNOSIS — Z7902 Long term (current) use of antithrombotics/antiplatelets: Secondary | ICD-10-CM

## 2018-01-11 DIAGNOSIS — E876 Hypokalemia: Secondary | ICD-10-CM | POA: Diagnosis not present

## 2018-01-11 DIAGNOSIS — F0150 Vascular dementia without behavioral disturbance: Secondary | ICD-10-CM | POA: Diagnosis not present

## 2018-01-11 DIAGNOSIS — I9589 Other hypotension: Secondary | ICD-10-CM

## 2018-01-11 DIAGNOSIS — I471 Supraventricular tachycardia: Secondary | ICD-10-CM | POA: Diagnosis present

## 2018-01-11 DIAGNOSIS — H409 Unspecified glaucoma: Secondary | ICD-10-CM | POA: Diagnosis present

## 2018-01-11 DIAGNOSIS — I498 Other specified cardiac arrhythmias: Secondary | ICD-10-CM

## 2018-01-11 DIAGNOSIS — E162 Hypoglycemia, unspecified: Secondary | ICD-10-CM | POA: Diagnosis not present

## 2018-01-11 DIAGNOSIS — Z888 Allergy status to other drugs, medicaments and biological substances status: Secondary | ICD-10-CM

## 2018-01-11 DIAGNOSIS — E2740 Unspecified adrenocortical insufficiency: Secondary | ICD-10-CM | POA: Diagnosis present

## 2018-01-11 DIAGNOSIS — H5461 Unqualified visual loss, right eye, normal vision left eye: Secondary | ICD-10-CM | POA: Diagnosis present

## 2018-01-11 HISTORY — DX: Unspecified dementia, unspecified severity, without behavioral disturbance, psychotic disturbance, mood disturbance, and anxiety: F03.90

## 2018-01-11 HISTORY — DX: Unspecified atrial fibrillation: I48.91

## 2018-01-11 HISTORY — DX: Pneumonia, unspecified organism: J18.9

## 2018-01-11 HISTORY — DX: Syncope and collapse: R55

## 2018-01-11 HISTORY — DX: Blindness, one eye, unspecified eye: H54.40

## 2018-01-11 HISTORY — DX: Nonrheumatic aortic (valve) stenosis: I35.0

## 2018-01-11 HISTORY — DX: Bradycardia, unspecified: R00.1

## 2018-01-11 LAB — URINALYSIS, ROUTINE W REFLEX MICROSCOPIC
Bilirubin Urine: NEGATIVE
GLUCOSE, UA: NEGATIVE mg/dL
HGB URINE DIPSTICK: NEGATIVE
KETONES UR: NEGATIVE mg/dL
Leukocytes, UA: NEGATIVE
Nitrite: NEGATIVE
PH: 7 (ref 5.0–8.0)
Protein, ur: NEGATIVE mg/dL
SPECIFIC GRAVITY, URINE: 1.005 (ref 1.005–1.030)

## 2018-01-11 LAB — BASIC METABOLIC PANEL
Anion gap: 9 (ref 5–15)
BUN: 5 mg/dL — AB (ref 8–23)
CO2: 27 mmol/L (ref 22–32)
Calcium: 8.7 mg/dL — ABNORMAL LOW (ref 8.9–10.3)
Chloride: 104 mmol/L (ref 98–111)
Creatinine, Ser: 1.06 mg/dL (ref 0.61–1.24)
GFR calc Af Amer: >60 mL/min (ref >60)
GLUCOSE: 93 mg/dL (ref 70–99)
Potassium: 3.2 mmol/L — ABNORMAL LOW (ref 3.5–5.1)
Sodium: 140 mmol/L (ref 135–145)

## 2018-01-11 LAB — CBC
HCT: 36.3 % — ABNORMAL LOW (ref 39.0–52.0)
HEMOGLOBIN: 11.5 g/dL — AB (ref 13.0–17.0)
MCH: 28.1 pg (ref 26.0–34.0)
MCHC: 31.7 g/dL (ref 30.0–36.0)
MCV: 88.8 fL (ref 78.0–100.0)
Platelets: 152 10*3/uL (ref 150–400)
RBC: 4.09 MIL/uL — ABNORMAL LOW (ref 4.22–5.81)
RDW: 13.6 % (ref 11.5–15.5)
WBC: 4.2 10*3/uL (ref 4.0–10.5)

## 2018-01-11 LAB — I-STAT TROPONIN, ED: TROPONIN I, POC: 0.01 ng/mL (ref 0.00–0.08)

## 2018-01-11 LAB — CBG MONITORING, ED: Glucose-Capillary: 85 mg/dL (ref 70–99)

## 2018-01-11 MED ORDER — GABAPENTIN 100 MG PO CAPS
100.0000 mg | ORAL_CAPSULE | Freq: Two times a day (BID) | ORAL | Status: DC
  Administered 2018-01-11 – 2018-01-16 (×9): 100 mg via ORAL
  Filled 2018-01-11 (×10): qty 1

## 2018-01-11 MED ORDER — ACETAMINOPHEN 650 MG RE SUPP: 650.0000 mg | Freq: Four times a day (QID) | RECTAL | Status: DC | PRN

## 2018-01-11 MED ORDER — RISPERIDONE 1 MG PO TABS
1.5000 mg | ORAL_TABLET | Freq: Two times a day (BID) | ORAL | Status: DC
  Administered 2018-01-11: 1.5 mg via ORAL
  Filled 2018-01-11 (×2): qty 2

## 2018-01-11 MED ORDER — TRAZODONE HCL 50 MG PO TABS
50.0000 mg | ORAL_TABLET | Freq: Every day | ORAL | Status: DC
  Administered 2018-01-11 – 2018-01-13 (×2): 50 mg via ORAL
  Filled 2018-01-11 (×2): qty 1

## 2018-01-11 MED ORDER — SERTRALINE HCL 50 MG PO TABS
25.0000 mg | ORAL_TABLET | Freq: Two times a day (BID) | ORAL | Status: DC
  Administered 2018-01-11 – 2018-01-14 (×6): 25 mg via ORAL
  Filled 2018-01-11 (×7): qty 1

## 2018-01-11 MED ORDER — ACETAMINOPHEN 325 MG PO TABS: 650.0000 mg | ORAL_TABLET | Freq: Four times a day (QID) | ORAL | Status: DC | PRN

## 2018-01-11 MED ORDER — SODIUM CHLORIDE 0.9 % IV BOLUS
500.0000 mL | Freq: Once | INTRAVENOUS | Status: AC
  Administered 2018-01-11: 500 mL via INTRAVENOUS

## 2018-01-11 MED ORDER — LEVOFLOXACIN 750 MG PO TABS: 750.0000 mg | ORAL_TABLET | Freq: Every day | ORAL | Status: DC

## 2018-01-11 MED ORDER — ALUM & MAG HYDROXIDE-SIMETH 200-200-20 MG/5ML PO SUSP: 30.0000 mL | Freq: Four times a day (QID) | ORAL | Status: DC | PRN

## 2018-01-11 MED ORDER — SODIUM CHLORIDE 0.9% FLUSH
3.0000 mL | Freq: Two times a day (BID) | INTRAVENOUS | Status: DC
  Administered 2018-01-11 – 2018-01-20 (×10): 3 mL via INTRAVENOUS

## 2018-01-11 MED ORDER — SENNOSIDES-DOCUSATE SODIUM 8.6-50 MG PO TABS: 1.0000 | ORAL_TABLET | Freq: Every evening | ORAL | Status: DC | PRN

## 2018-01-11 MED ORDER — BRIMONIDINE TARTRATE 0.2 % OP SOLN
1.0000 [drp] | Freq: Two times a day (BID) | OPHTHALMIC | Status: DC
  Administered 2018-01-11 – 2018-01-20 (×17): 1 [drp] via OPHTHALMIC
  Filled 2018-01-11: qty 5

## 2018-01-11 MED ORDER — CLOPIDOGREL BISULFATE 75 MG PO TABS
75.0000 mg | ORAL_TABLET | Freq: Every day | ORAL | Status: DC
  Administered 2018-01-13 – 2018-01-20 (×8): 75 mg via ORAL
  Filled 2018-01-11 (×9): qty 1

## 2018-01-11 MED ORDER — TIMOLOL MALEATE 0.5 % OP SOLN
1.0000 [drp] | Freq: Two times a day (BID) | OPHTHALMIC | Status: DC
  Administered 2018-01-11: 1 [drp] via OPHTHALMIC
  Filled 2018-01-11: qty 5

## 2018-01-11 MED ORDER — ONDANSETRON HCL 4 MG/2ML IJ SOLN: 4.0000 mg | Freq: Four times a day (QID) | INTRAMUSCULAR | Status: DC | PRN

## 2018-01-11 MED ORDER — ATORVASTATIN CALCIUM 40 MG PO TABS
40.0000 mg | ORAL_TABLET | Freq: Every day | ORAL | Status: DC
  Administered 2018-01-11 – 2018-01-20 (×10): 40 mg via ORAL
  Filled 2018-01-11 (×10): qty 1

## 2018-01-11 MED ORDER — POTASSIUM CHLORIDE 20 MEQ PO PACK
40.0000 meq | PACK | Freq: Once | ORAL | Status: AC
  Administered 2018-01-11: 40 meq via ORAL
  Filled 2018-01-11 (×2): qty 2

## 2018-01-11 MED ORDER — BISACODYL 10 MG RE SUPP: 10.0000 mg | Freq: Every day | RECTAL | Status: DC | PRN

## 2018-01-11 MED ORDER — ONDANSETRON HCL 4 MG PO TABS: 4.0000 mg | ORAL_TABLET | Freq: Four times a day (QID) | ORAL | Status: DC | PRN

## 2018-01-11 MED ORDER — ENOXAPARIN SODIUM 40 MG/0.4ML ~~LOC~~ SOLN
40.0000 mg | SUBCUTANEOUS | Status: DC
  Administered 2018-01-11 – 2018-01-20 (×10): 40 mg via SUBCUTANEOUS
  Filled 2018-01-11 (×10): qty 0.4

## 2018-01-11 NOTE — ED Notes (Addendum)
Rinita (Daughter) [phone]

## 2018-01-11 NOTE — ED Notes (Signed)
Assisted pt to use urinal, unable to provide urine, will revisit

## 2018-01-11 NOTE — H&P (Addendum)
History and Physical    [name] QIO:[id number] DOB: 10/24/1933 DOA: 01/11/2018  PCP: Deeann SaintBanks, Shannon R, MD (Confirm with patient/family/NH records and if not entered, this has to be [facility] at [facility] point [facility]) Patient coming from: Home  I have personally briefly reviewed patient's [facility]  Chief Complaint: syncope  HPI: Sundra AlandJames Arthur Buelna is a 84 y.o. male with medical history significant of dementia moderate to severe us aortic stenosis hypertension presents with syncope.  Of note patient is currently unaccompanied so unable to get any history from patient however per medical records and ED physician patient had a syncopal episode today at home.  He was outside during the day while waring  pretty warm clothing.  When EMS arrived patient systolic blood pressure was in the 60s.  Per report after patient woke up from a syncopal event he was still confused and vomited x1. After        IV fluids in the ED blood pressure much improved.  However family told ED physician the patient was still not near his baseline.  Patient was treated for pneumonia in [place] earlier this month and has 2 more days on Levaquin.  During his stay in [place] he had an echo done that showed EF greater than 55% but moderate to severe aortic  stenosis.  Patient was referred to outpatient cardiology but does not appear he has had a follow-up as of yet.  ED Course: Patient received 500 cc IV fluid bolus with marked improvement of his blood pressure.  Chest x-ray nonacute, UA negative as well.  Review of Systems: Positive for syncope denies chest pain shortness of breath  All other systems reviewed but are otherwise unobtainable due to patient's dementia and he is currently unaccompanied.  Past Medical History:  Diagnosis Date  . A-fib (HCC)   . AS (aortic stenosis)   . Blind right eye   . Bradycardia   . Chicken pox   . Dementia   . Frequent urinary tract infections     . Glaucoma   . Hypertension   . PNA (pneumonia)   . Stroke [facility](HCC)    5 years ago  . Syncope   reviewed  Past Surgical History:  Procedure Laterality Date  . EYE SURGERY       reports that he has never smoked. He has never used smokeless tobacco. He reports that he does not drink alcohol or use drugs. reviewed No Known Allergies  Family History  Problem Relation Age of Onset  . CAD Father   reviewed   Prior to Admission medications   Medication Sig Start Date End Date Taking? Authorizing Provider  amLODipine (NORVASC) 5 MG tablet Take 1 tablet (5 mg total) by mouth at bedtime. 12/12/17  Yes [name], William F, MD  atorvastatin (LIPITOR) 40 MG tablet Take 1 tablet (40 mg total) by mouth daily at 6 PM. Patient taking differently: Take 40 mg by mouth at bedtime.  12/12/17  Yes [name], William F, MD  brimonidine (ALPHAGAN) 0.2 % ophthalmic solution Place 1 drop into both eyes 2 (two) times daily.   Yes [provider]  clopidogrel (PLAVIX) 75 MG tablet Take 1 tablet (75 mg total) by mouth daily. 12/12/17  Yes [name], William F, MD  gabapentin (NEURONTIN) 100 MG capsule Take 1 capsule (100 mg total) by mouth daily. Patient taking differently: Take 100 mg by mouth 2 (two) times daily.  12/12/17  Yes [name], William F, MD  hydrochlorothiazide (HYDRODIURIL) 25 MG tablet Take 25 mg by mouth daily.  01/09/18  Yes [provider]  levofloxacin (LEVAQUIN) 750 MG tablet Take 750 mg by mouth daily. 5 day course started 01/09/18 01/09/18 01/14/18 Yes [provider]  memantine (NAMENDA) 10 MG tablet Take 10 mg by mouth 2 (two) times daily.  09/18/17 09/18/18 Yes [provider]  risperiDONE (RISPERDAL) 1 MG tablet Take 1.5 tablets (1.5 mg total) by mouth 2 (two) times daily. Patient taking differently: Take 2 mg by mouth 2 (two) times daily.  12/12/17  Yes Pecola Lawless, MD  sertraline (ZOLOFT) 25 MG tablet Take 25 mg by mouth 2 (two) times daily.    Yes [provider]  timolol (BETIMOL) 0.5 % ophthalmic solution Place 1 drop into both eyes 2 (two) times daily.   Yes [provider]  traZODone (DESYREL) 50 MG tablet Take 1 tablet (50 mg total) by mouth at bedtime. 12/12/17  Yes Pecola Lawless, MD  acetaminophen (TYLENOL) 325 MG tablet Take 2 tablets (650 mg total) by mouth every 6 (six) hours as needed for mild pain (or Fever >/= 101). Patient not taking: Reported on 01/09/2018 12/09/17   [name], MD  aspirin EC 81 MG tablet Take 1 tablet (81 mg total) by mouth daily. Patient not taking: Reported on 01/09/2018 12/09/17 03/09/18  [name], MD    Physical Exam: Vitals:   01/11/18 1830 01/11/18 1845 01/11/18 1915 01/11/18 1950  BP: 116/61 129/71 (!) 122/59 104/76  Pulse: 75 82 83 97  Resp: (!) 26 14 (!) 23 18  Temp:    98.2 F (36.8 C)  TempSrc:    Oral  SpO2: 98% 97% 95% 100%  Weight:      Height:        Constitutional: NAD, calm, comfortable Vitals:   01/11/18 1830 01/11/18 1845 01/11/18 1915 01/11/18 1950  BP: 116/61 129/71 (!) 122/59 104/76  Pulse: 75 82 83 97  Resp: (!) 26 14 (!) 23 18  Temp:    98.2 F (36.8 C)  TempSrc:    Oral  SpO2: 98% 97% 95% 100%  Weight:      Height:       Eyes: PRL left, lids and conjunctivae normal, right eye blind ENMT: Mucous membranes are moist. Posterior pharynx clear of any exudate or lesions.Normal dentition.  Neck: normal, supple, no masses, no thyromegaly Respiratory: clear to auscultation bilaterally, no wheezing, no crackles. Normal respiratory effort. No accessory muscle use.  Cardiovascular: Regular rate and rhythm, + sys  Murmurs, trace pedal  extremity edema. 1+ pedal pulses.   Abdomen: no tenderness, no masses palpated. No hepatosplenomegaly. Bowel sounds positive.  Musculoskeletal: no clubbing / cyanosis. No joint deformity upper and lower extremities. Good ROM, no contractures. Normal muscle tone.  Skin: no rashes, lesions, ulcers. No  induration Neurologic: CN 2-12 grossly intact except right eye blindness chronic . Marland Kitchen Strength 5/5 in all 4.  RLE  4 out of 5 strength left 3+ out of 5 strength RUE 4+/5 strengthen LUE 4/5  Psychiatric: poor  judgment and insight. Alert. Normal mood. Demented. orientated self, hospital, bday   (Labs on Admission: I have personally reviewed following labs and imaging studies  CBC: Recent Labs  Lab 01/11/18 1603  WBC 4.2  HGB 11.5*  HCT 36.3*  MCV 88.8  PLT 152   Basic Metabolic Panel: Recent Labs  Lab 01/11/18 1603  NA 140  K 3.2*  CL 104  CO2  27  GLUCOSE 93  BUN 5*  CREATININE 1.06  CALCIUM 8.7*   GFR: Estimated Creatinine Clearance: 60 mL/min (by C-G formula based on SCr of 1.06 mg/dL). Liver Function Tests: No results for input(s): AST, ALT, ALKPHOS, BILITOT, PROT, ALBUMIN in the last 168 hours. No results for input(s): LIPASE, AMYLASE in the last 168 hours. No results for input(s): AMMONIA in the last 168 hours. Coagulation Profile: No results for input(s): INR, PROTIME in the last 168 hours. Cardiac Enzymes: No results for input(s): CKTOTAL, CKMB, CKMBINDEX, TROPONINI in the last 168 hours. BNP (last 3 results) No results for input(s): PROBNP in the last 8760 hours. HbA1C: No results for input(s): HGBA1C in the last 72 hours. CBG: Recent Labs  Lab 01/11/18 1605  GLUCAP 85   Lipid Profile: No results for input(s): CHOL, HDL, LDLCALC, TRIG, CHOLHDL, LDLDIRECT in the last 72 hours. Thyroid Function Tests: No results for input(s): TSH, T4TOTAL, FREET4, T3FREE, THYROIDAB in the last 72 hours. Anemia Panel: No results for input(s): VITAMINB12, FOLATE, FERRITIN, TIBC, IRON, RETICCTPCT in the last 72 hours. Urine analysis:    Component Value Date/Time   COLORURINE YELLOW 01/11/2018 1730   APPEARANCEUR CLEAR 01/11/2018 1730   LABSPEC 1.005 01/11/2018 1730   PHURINE 7.0 01/11/2018 1730   GLUCOSEU NEGATIVE 01/11/2018 1730   HGBUR NEGATIVE 01/11/2018 1730    BILIRUBINUR NEGATIVE 01/11/2018 1730   KETONESUR NEGATIVE 01/11/2018 1730   PROTEINUR NEGATIVE 01/11/2018 1730   NITRITE NEGATIVE 01/11/2018 1730   LEUKOCYTESUR NEGATIVE 01/11/2018 1730    Radiological Exams on Admission: Ct Head Wo Contrast  Result Date: 01/11/2018 CLINICAL DATA:  Syncopal episode while getting out of car today. EXAM: CT HEAD WITHOUT CONTRAST TECHNIQUE: Contiguous axial images were obtained from the base of the skull through the vertex without intravenous contrast. COMPARISON:  December 04, 2017 FINDINGS: Brain: No evidence of acute infarction, hemorrhage, hydrocephalus, extra-axial collection or mass lesion/mass effect. There is chronic diffuse atrophy. Chronic bilateral periventricular white matter small vessel ischemic changes identified. Vascular: No hyperdense vessel or unexpected calcification. Skull: Normal. Negative for fracture or focal lesion. Sinuses/Orbits: No acute finding. Mild mucoperiosteal thickening of bilateral ethmoid sinuses are noted. Other: None. IMPRESSION: No focal acute intracranial abnormality identified. Chronic diffuse atrophy and chronic bilateral periventricular white matter small vessel ischemic change. Electronically Signed   By: Sherian Rein M.D.   On: 01/11/2018 19:15   Dg Chest Port 1 View  Result Date: 01/11/2018 CLINICAL DATA:  Syncope and vomiting today. EXAM: PORTABLE CHEST 1 VIEW COMPARISON:  December 04, 2017 FINDINGS: The heart size and mediastinal contours are within normal limits. Both lungs are clear. The visualized skeletal structures are stable. IMPRESSION: No active cardiopulmonary disease. Electronically Signed   By: Sherian Rein M.D.   On: 01/11/2018 17:08    EKG: Independently reviewed.  Sinus rhythm with occasional PACs prolonged QTC abnormal EKG  Assessment/Plan Principal Problem:   Syncope and collapse Active Problems:   Essential hypertension   Dementia   Syncope   Community acquired pneumonia of right lower lobe of lung  (HCC)   Nonrheumatic aortic valve stenosis   Hypokalemia   Hypotension   Dehydration   Hx of st4roke 1.  Monitor on telemetry.  Check orthostatic vital signs.  Patient quite hypotensive on admission so likely the cause of his syncope.  No obvious signs of infection.  BP within normal limits now after 500 mL's of IV fluids given in the ED.  Patient has had a few admissions for syncope  over the past few months.  He was found to have moderate to severe aortic stenosis however he has not had a cardiac work-up to this point.  Consider cardiology consultation while here. Head ct neg . Trop neg.   2. Hold HCTZ/ BP meds 3.  Pneumonia present before admission.  qtc prolonged so stop outpatient Levaquin. repeat EKG in am , CXR clear 4. Replace potassium p.o. 5. Cont home meds for dementia 6. Cont Plavix  DVT prophylaxis: Lovenox  Code Status: Full Family Communication:  Patient unaccompanied disposition Plan:  D/c home within 24 hrs depending on cardiolgy recommendation Admission status: OBS tele   Marquize Seib Johnson-Pitts MD [facility] Pager [phone]  If 7PM-7AM, please contact night-coverage [email or web address] Password [email or web address]  01/11/2018, 8:10 PM

## 2018-01-11 NOTE — ED Notes (Signed)
CBG obtained (85 mg/dL), RN aware

## 2018-01-11 NOTE — ED Notes (Signed)
Patient transported to CT 

## 2018-01-11 NOTE — ED Triage Notes (Addendum)
  Pt out shopping w/ daughter, getting out of car when pt had syncopal episode, 1 episode of vomiting, given 4 Zofran PTA; hx alzheimer's, currently being treated for pneumonia, afib, A&Ox4 w/ EMS  HR 54-60 BP 60 palp on EMS arrival, 95/50 after 200 mL NS CBG 106

## 2018-01-11 NOTE — ED Provider Notes (Signed)
[facility] Emergency Department Provider Note MRN:  [id number]  Arrival date & time: 01/12/18     Chief Complaint   Loss of Consciousness   History of Present Illness   Dan Stone is a 84 y.o. year-old male with a history of stroke, recent pneumonia presenting to the ED with chief complaint of syncope.  History obtained from daughter.  She explains that patient is recovering from a recent pneumonia.  Currently taking oral antibiotics.  Has been recovering well and was his normal self this morning.  Was out doing errands with his daughter, was wearing hooded sweatshirt and sweatpants.  Suddenly became less responsive, breathing rapidly, brief loss of consciousness.  Patient was next to his daughter in a sitting position, but did not fall, no trauma.  Single episode of emesis, EMS was called.  Report of systolic blood pressure in the 60s on EMS arrival.  I was unable to obtain an accurate HPI, PMH, or ROS due to the patient's altered mental status.   Review of Systems  Positive for altered mental status, syncope.  Patient's Health History    Past Medical History:  Diagnosis Date  . A-fib (HCC)   . AS (aortic stenosis)   . Blind right eye   . Bradycardia   . Chicken pox   . Dementia   . Frequent urinary tract infections   . Glaucoma   . Hypertension   . PNA (pneumonia)   . Stroke [facility])    5 years ago  . Syncope     Past Surgical History:  Procedure Laterality Date  . EYE SURGERY      Family History  Problem Relation Age of Onset  . CAD Father     Social History   Socioeconomic History  . Marital status: Single    Spouse name: Not on file  . Number of children: Not on file  . Years of education: Not on file  . Highest education level: Not on file  Occupational History  . Not on file  Social Needs  . Financial resource strain: Not on file  . Food insecurity:    Worry: Not on file    Inability: Not on file  . Transportation needs:      Medical: Not on file    Non-medical: Not on file  Tobacco Use  . Smoking status: Never Smoker  . Smokeless tobacco: Never Used  Substance and Sexual Activity  . Alcohol use: Never    Frequency: Never  . Drug use: Never  . Sexual activity: Not Currently  Lifestyle  . Physical activity:    Days per week: Not on file    Minutes per session: Not on file  . Stress: Not on file  Relationships  . Social connections:    Talks on phone: Not on file    Gets together: Not on file    Attends religious service: Not on file    Active member of club or organization: Not on file    Attends meetings of clubs or organizations: Not on file    Relationship status: Not on file  . Intimate partner violence:    Fear of current or ex partner: Not on file    Emotionally abused: Not on file    Physically abused: Not on file    Forced sexual activity: Not on file  Other Topics Concern  . Not on file  Social History Narrative  . Not on file     Physical Exam  Vital Signs and Nursing Notes reviewed Vitals:   01/11/18 1915 01/11/18 1950  BP: (!) 122/59 104/76  Pulse: 83 97  Resp: (!) 23 18  Temp:  98.2 F (36.8 C)  SpO2: 95% 100%    CONSTITUTIONAL: Well-appearing, NAD NEURO: Somnolent but wakes to voice, left-sided weakness (at baseline), oriented to name EYES:  eyes equal and reactive ENT/NECK:  no LAD, no JVD CARDIO: Regular rate, well-perfused, normal S1 and S2 PULM:  CTAB no wheezing or rhonchi GI/GU:  normal bowel sounds, non-distended, non-tender MSK/SPINE:  No gross deformities, no edema SKIN:  no rash, atraumatic PSYCH:  Appropriate speech and behavior  Diagnostic and Interventional Summary    EKG Interpretation  Date/Time:  Saturday January 11 2018 15:42:04 EDT Ventricular Rate:  70 PR Interval:    QRS Duration: 77 QT Interval:  464 QTC Calculation: 501 R Axis:   54 Text Interpretation:  Sinus or ectopic atrial rhythm Probable left atrial enlargement Borderline T  abnormalities, lateral leads Prolonged QT interval Confirmed by Kennis Carina [phone]) on 01/11/2018 4:20:56 PM      Labs Reviewed  BASIC METABOLIC PANEL - Abnormal; Notable for the following components:      Result Value   Potassium 3.2 (*)    BUN 5 (*)    Calcium 8.7 (*)    All other components within normal limits  CBC - Abnormal; Notable for the following components:   RBC 4.09 (*)    Hemoglobin 11.5 (*)    HCT 36.3 (*)    All other components within normal limits  URINALYSIS, ROUTINE W REFLEX MICROSCOPIC  BASIC METABOLIC PANEL  CBG MONITORING, ED  I-STAT TROPONIN, ED    CT HEAD WO CONTRAST  Final Result    DG Chest Port 1 View  Final Result      Medications  atorvastatin (LIPITOR) tablet 40 mg (40 mg Oral Given 01/11/18 2240)  traZODone (DESYREL) tablet 50 mg (50 mg Oral Given 01/11/18 2240)  sertraline (ZOLOFT) tablet 25 mg (25 mg Oral Given 01/11/18 2240)  risperiDONE (RISPERDAL) tablet 1.5 mg (1.5 mg Oral Given 01/11/18 2240)  clopidogrel (PLAVIX) tablet 75 mg (has no administration in time range)  gabapentin (NEURONTIN) capsule 100 mg (100 mg Oral Given 01/11/18 2240)  brimonidine (ALPHAGAN) 0.2 % ophthalmic solution 1 drop (1 drop Both Eyes Given 01/11/18 2240)  timolol (TIMOPTIC) 0.5 % ophthalmic solution 1 drop (1 drop Both Eyes Given 01/11/18 2241)  sodium chloride flush (NS) 0.9 % injection 3 mL (3 mLs Intravenous Given 01/11/18 2241)  acetaminophen (TYLENOL) tablet 650 mg (has no administration in time range)    Or  acetaminophen (TYLENOL) suppository 650 mg (has no administration in time range)  senna-docusate (Senokot-S) tablet 1 tablet (has no administration in time range)  bisacodyl (DULCOLAX) suppository 10 mg (has no administration in time range)  alum & mag hydroxide-simeth (MAALOX/MYLANTA) 200-200-20 MG/5ML suspension 30 mL (has no administration in time range)  enoxaparin (LOVENOX) injection 40 mg (40 mg Subcutaneous Given 01/11/18 2047)  sodium chloride 0.9  % bolus 500 mL (0 mLs Intravenous Stopped 01/11/18 1713)  potassium chloride (KLOR-CON) packet 40 mEq (40 mEq Oral Given 01/11/18 2240)     Procedures Critical Care Critical Care Documentation Critical care time provided by me (excluding procedures): 33 minutes  Condition necessitating critical care: Hypotension, altered mental status  Components of critical care management: reviewing of prior records, laboratory and imaging interpretation, frequent re-examination and reassessment of vital signs, administration of IV fluid resuscitation, discussion with consultants.  ED Course and Medical Decision Making  I have reviewed the triage vital signs and the nursing notes.  Pertinent labs & imaging results that were available during my care of the patient were reviewed by me and considered in my medical decision making (see below for details). Clinical Course as of Jan 12 17  Sat Jan 11, 2018  [id number] 84 year old male history of stroke, severe aortic stenosis presenting with syncopal episode.  Recent admission for pneumonia at outside hospital, currently taking p.o. antibiotics at home.  Has had syncopal episodes in the past.  Recurrent episode today, no trauma, no neurological symptoms prior to or after the syncopal episode, baseline neurological exam in the ED.  Blood pressure reported to be 60s in the field, improved to 90s on arrival here, back down to the 80s systolic.  Given 500 cc normal saline, patient remains altered per daughter.  Favoring dehydration, will evaluate for metabolic disarray, recurrence of pneumonia, UTI.  Currently no evidence to support intracranial phenomenon.   [MB]    Clinical Course User Index [MB] Sabas SousBero, Karsin Pesta M, MD    Work-up largely unremarkable but continued altered mental status and concern for high risk syncope, admitted to medicine service for further care and evaluation.  Elmer SowMichael M. Pilar PlateBero, MD [facility] [email or web address]  Final Clinical Impressions(s) / ED Diagnoses     ICD-10-CM   1. Syncope, unspecified syncope type R55   2. Altered awareness, transient R40.4 [facility]    DG Chest Port 1 View  3. Hypotension due to hypovolemia I95.89    E86.1     ED Discharge Orders    None         Sabas SousBero, [place] Janus M, MD 01/12/18 [phone]

## 2018-01-12 DIAGNOSIS — E876 Hypokalemia: Secondary | ICD-10-CM | POA: Diagnosis present

## 2018-01-12 DIAGNOSIS — H5461 Unqualified visual loss, right eye, normal vision left eye: Secondary | ICD-10-CM | POA: Diagnosis present

## 2018-01-12 DIAGNOSIS — I498 Other specified cardiac arrhythmias: Secondary | ICD-10-CM | POA: Diagnosis not present

## 2018-01-12 DIAGNOSIS — F0150 Vascular dementia without behavioral disturbance: Secondary | ICD-10-CM | POA: Diagnosis present

## 2018-01-12 DIAGNOSIS — I350 Nonrheumatic aortic (valve) stenosis: Secondary | ICD-10-CM | POA: Diagnosis present

## 2018-01-12 DIAGNOSIS — E162 Hypoglycemia, unspecified: Secondary | ICD-10-CM | POA: Diagnosis not present

## 2018-01-12 DIAGNOSIS — I69354 Hemiplegia and hemiparesis following cerebral infarction affecting left non-dominant side: Secondary | ICD-10-CM | POA: Diagnosis not present

## 2018-01-12 DIAGNOSIS — J181 Lobar pneumonia, unspecified organism: Secondary | ICD-10-CM | POA: Diagnosis present

## 2018-01-12 DIAGNOSIS — Z7982 Long term (current) use of aspirin: Secondary | ICD-10-CM | POA: Diagnosis not present

## 2018-01-12 DIAGNOSIS — H409 Unspecified glaucoma: Secondary | ICD-10-CM | POA: Diagnosis present

## 2018-01-12 DIAGNOSIS — G9340 Encephalopathy, unspecified: Secondary | ICD-10-CM | POA: Diagnosis present

## 2018-01-12 DIAGNOSIS — Z7902 Long term (current) use of antithrombotics/antiplatelets: Secondary | ICD-10-CM | POA: Diagnosis not present

## 2018-01-12 DIAGNOSIS — E2740 Unspecified adrenocortical insufficiency: Secondary | ICD-10-CM | POA: Diagnosis present

## 2018-01-12 DIAGNOSIS — I951 Orthostatic hypotension: Secondary | ICD-10-CM | POA: Diagnosis present

## 2018-01-12 DIAGNOSIS — I4581 Long QT syndrome: Secondary | ICD-10-CM | POA: Diagnosis present

## 2018-01-12 DIAGNOSIS — Z8249 Family history of ischemic heart disease and other diseases of the circulatory system: Secondary | ICD-10-CM | POA: Diagnosis not present

## 2018-01-12 DIAGNOSIS — R0902 Hypoxemia: Secondary | ICD-10-CM | POA: Diagnosis not present

## 2018-01-12 DIAGNOSIS — R55 Syncope and collapse: Secondary | ICD-10-CM | POA: Diagnosis present

## 2018-01-12 DIAGNOSIS — I509 Heart failure, unspecified: Secondary | ICD-10-CM | POA: Diagnosis present

## 2018-01-12 DIAGNOSIS — I6930 Unspecified sequelae of cerebral infarction: Secondary | ICD-10-CM | POA: Diagnosis not present

## 2018-01-12 DIAGNOSIS — I441 Atrioventricular block, second degree: Secondary | ICD-10-CM | POA: Diagnosis not present

## 2018-01-12 DIAGNOSIS — E860 Dehydration: Secondary | ICD-10-CM | POA: Diagnosis present

## 2018-01-12 DIAGNOSIS — I10 Essential (primary) hypertension: Secondary | ICD-10-CM | POA: Diagnosis not present

## 2018-01-12 DIAGNOSIS — Z888 Allergy status to other drugs, medicaments and biological substances status: Secondary | ICD-10-CM | POA: Diagnosis not present

## 2018-01-12 DIAGNOSIS — I110 Hypertensive heart disease with heart failure: Secondary | ICD-10-CM | POA: Diagnosis present

## 2018-01-12 DIAGNOSIS — I471 Supraventricular tachycardia: Secondary | ICD-10-CM | POA: Diagnosis present

## 2018-01-12 HISTORY — DX: Orthostatic hypotension: I95.1

## 2018-01-12 LAB — BASIC METABOLIC PANEL
Anion gap: 10 (ref 5–15)
BUN: 6 mg/dL — AB (ref 8–23)
CALCIUM: 8.6 mg/dL — AB (ref 8.9–10.3)
CO2: 28 mmol/L (ref 22–32)
Chloride: 103 mmol/L (ref 98–111)
Creatinine, Ser: 1 mg/dL (ref 0.61–1.24)
GFR calc Af Amer: >60 mL/min (ref >60)
GLUCOSE: 100 mg/dL — AB (ref 70–99)
POTASSIUM: 3.8 mmol/L (ref 3.5–5.1)
SODIUM: 141 mmol/L (ref 135–145)

## 2018-01-12 LAB — GLUCOSE, CAPILLARY
GLUCOSE-CAPILLARY: 97 mg/dL (ref 70–99)
Glucose-Capillary: 88 mg/dL (ref 70–99)

## 2018-01-12 MED ORDER — SODIUM CHLORIDE 0.9 % IV BOLUS
500.0000 mL | Freq: Once | INTRAVENOUS | Status: AC
  Administered 2018-01-12: 500 mL via INTRAVENOUS

## 2018-01-12 MED ORDER — SODIUM CHLORIDE 0.9 % IV SOLN
INTRAVENOUS | Status: DC
  Administered 2018-01-12 (×2): via INTRAVENOUS

## 2018-01-12 NOTE — Progress Notes (Signed)
Pt difficult to arose, sternal rub gets minimal verbal response from patient. Dr Irene LimboGoodrich seen on floor, requested his presence in pt room.

## 2018-01-12 NOTE — Progress Notes (Signed)
Patient woke and spoke with nurse. Offering smiles and conversation; denying desire for further food.

## 2018-01-12 NOTE — Progress Notes (Signed)
Patient wakes with Dr Irene LimboGoodrich at bedside. Pt alert, oriented to baseline, neuro intact.   Daughter at bedside to witness pt waking.   ABG order canceled per MD request at bedside.    Bolus canceled, continuous infusion started.

## 2018-01-12 NOTE — Progress Notes (Signed)
Patient still quite drowsy, wakes for minimal conversation.

## 2018-01-12 NOTE — Progress Notes (Signed)
Call placed to pharmacy to request a re-check on patient's home medications, now that family has a bag of drugs at bedside.

## 2018-01-12 NOTE — Progress Notes (Signed)
Dr Goodrich at bedside

## 2018-01-12 NOTE — Progress Notes (Signed)
Patient's daughter is at bedside now; she thought her father had medication due to his excessive sleepiness.   Report from night shift that pt is confused at baseline (confirmed by daughter)  IF pt is in a new environment.

## 2018-01-12 NOTE — Progress Notes (Signed)
PROGRESS NOTE  Dan FavorJames Arthur Lara Stone [id number] DOB: 1933-11-07 DOA: 01/11/2018 PCP: Deeann SaintBanks, Shannon R, MD just established care 8/8, moved from [place]?  Brief Narrative: 84 year [place], [place], stroke, atrial fibrillation presented with history of syncope outside, while wearing warm clothing.  Reportedly had systolic blood pressures in the 60s per EMS.  Patient confused and vomited after syncopal episode.  Recently treated for pneumonia and [name], still on Levaquin.  Assessment/Plan Syncope.  Second event since July.  Daughter describes seizure-like activity with these events.  Description however does not suggest postictal type phenomenon.  This event occurred while wearing warm clothing outside.  Patient found to be markedly orthostatic in the ED. --Broad differential certainly vasovagal type phenomenon is possible with seizure-like activity.  However given wandering pacemaker, pauses, blocked PACs, Mobitz 1, certainly dysrhythmia could also precipitate; new onset seizures, also the differential would be aortic stenosis, and risperidone. --On telemetry.  Further work-up as below.  Wandering pacemaker. Multifocal atrial tachycardia seen last admission. --Sinus bradycardia on telemetry.  Continue to monitor.  EKG suggests Mobitz 1 or brief Mobitz 2. --We will ask for cardiology opinion 8/12.  Avoid AV nodal agents.  Moderate to severe aortic stenosis.  Cardiology recommended conservative management with repeat echo 6 months.  Hypotension, orthostatic hypotension.  Thought secondary to dehydration. --Hold amlodipine, hydrochlorothiazide --Continue IV fluids.  Repeat orthostatics in a.m.  Acute encephalopathy --Hold Namenda --No seizure activity noted here, etiology of unresponsive event unclear.  Check EEG.  Monitor clinically.  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  Initially patient briefly responded to sternal rub, however the course of the next 5-10 minutes of evaluation he spontaneously awoke and follows commands.  Denied complaints.  Objective: Vitals:  Vitals:   01/12/18 1200 01/12/18 1205  BP:  112/79  Pulse:  65  Resp:  18  Temp: 98.3 F (36.8 C)   SpO2:  97%    Exam: Initially did not arouse to sternal rub other than for a brief second.  Did move all extremities to pain..,  Comfortable.  Functionally 10 minutes later the following exam pertains Constitutional:  . Appears calm and comfortable, awake, alert, speech fluent and clear. Eyes:  . Right pupil fixed, nonreactive, left pupil 2 mm . Normal lids  ENMT:  . grossly normal hearing  . Lips appear normal Neck:  . neck appears normal, no masses Respiratory:  . CTA bilaterally, no w/r/r.  . Respiratory effort normal.  Cardiovascular:  . Irregular.  No murmur, rub or gallop. . No LE extremity edema   . Telemetry shows sinus bradycardia with intermittent blocked PACs Abdomen:  . Abdomen appears normal; no tenderness or masses Musculoskeletal:  . Digits/nails BUE: no clubbing, cyanosis, petechiae, infection . RUE, LUE, RLE, LLE   . Moves all extremities to command.  Strength on the right 5/5.  Strength in the left 4/5 consistent with history  of stroke, per  daughter. Skin:  . No rashes, lesions, ulcers . palpation of skin: no induration or nodules Neurologic:  . Face is symmetric, cranial nerves appear grossly intact. Psychiatric:  . Mental status o Mood, affect appropriate . Follows simple commands.   I have personally reviewed the following:   Labs:  Basic metabolic panel unremarkable  Troponin negative  CBC unremarkable  Urinalysis negative  Blood sugar at time of event 88  Imaging studies:  Chest x-ray independently reviewed, no acute disease  CT head negative  Medical tests:  Probable sinus rhythm with blocked PACs, Mobitz 1  Review and summation of old records: Discharge 7/8, hospitalized for syncope.  Seen by electrophysiology and general cardiology at that time. Thought to be prolonged hypotension, vagally mediated.  Last cardiology note from Dr. Delton See "wandering atrial PM, frequent PACs, short runs of atrial tachycardia, intermittent 2:1 block, no pauses longer than 3 seconds. There is no indication for a PM. I have also reviewed his echocardiogram, his aortic stenosis is moderate to severe, however the patient is not a surgical candidate, given underlying dementia and being immobile he is currently not a candidate for a TAVR either. He should be consulted by palliative care/hospice."  However per d/c summary "If repeat syncopal event, referral to EP would be reasonable"  Scheduled Meds: . atorvastatin  40 mg Oral QHS  . brimonidine  1 drop Both Eyes BID  . clopidogrel  75 mg Oral Daily  . enoxaparin (LOVENOX) injection  40 mg Subcutaneous Q24H  . gabapentin  100 mg Oral BID  . sertraline  25 mg Oral BID  . sodium chloride flush  3 mL Intravenous Q12H  . timolol  1 drop Both Eyes BID  . traZODone  50 mg Oral QHS   Continuous Infusions: . sodium chloride 125 mL/hr at 01/12/18 1200    Principal Problem:   Syncope and collapse Active Problems:   Essential hypertension   Dementia   Nonrheumatic aortic  valve stenosis   Hypotension   Wandering atrial pacemaker   Aortic stenosis, moderate   Orthostatic hypotension   LOS: 0 days

## 2018-01-12 NOTE — Progress Notes (Signed)
Dr Irene LimboGoodrich notified again of patient condition.

## 2018-01-12 NOTE — Progress Notes (Signed)
Patient sleeping during shift report.    Bed alarm is on.

## 2018-01-13 ENCOUNTER — Inpatient Hospital Stay (HOSPITAL_COMMUNITY): Payer: Medicare Other

## 2018-01-13 ENCOUNTER — Telehealth: Payer: Self-pay | Admitting: Family Medicine

## 2018-01-13 DIAGNOSIS — F0150 Vascular dementia without behavioral disturbance: Secondary | ICD-10-CM

## 2018-01-13 DIAGNOSIS — I498 Other specified cardiac arrhythmias: Secondary | ICD-10-CM

## 2018-01-13 LAB — BASIC METABOLIC PANEL
Anion gap: 6 (ref 5–15)
BUN: 7 mg/dL — ABNORMAL LOW (ref 8–23)
CO2: 27 mmol/L (ref 22–32)
Calcium: 8 mg/dL — ABNORMAL LOW (ref 8.9–10.3)
Chloride: 108 mmol/L (ref 98–111)
Creatinine, Ser: 0.95 mg/dL (ref 0.61–1.24)
GFR calc Af Amer: >60 mL/min (ref >60)
GFR calc non Af Amer: >60 mL/min (ref >60)
Glucose, Bld: 77 mg/dL (ref 70–99)
Potassium: 3.8 mmol/L (ref 3.5–5.1)
Sodium: 141 mmol/L (ref 135–145)

## 2018-01-13 LAB — GLUCOSE, CAPILLARY
Glucose-Capillary: 67 mg/dL — ABNORMAL LOW (ref 70–99)
Glucose-Capillary: 78 mg/dL (ref 70–99)

## 2018-01-13 MED ORDER — DEXTROSE 50 % IV SOLN
INTRAVENOUS | Status: AC
  Filled 2018-01-13: qty 50

## 2018-01-13 NOTE — Progress Notes (Signed)
MD aware, no new orders  Will continue to monitor  

## 2018-01-13 NOTE — Progress Notes (Signed)
Pt drank two orange juices Will recheck CBG in 15 minutes Ordered pt breakfast

## 2018-01-13 NOTE — Progress Notes (Signed)
Pt daughter requesting update from MD  Paged MD to inform

## 2018-01-13 NOTE — Procedures (Signed)
ELECTROENCEPHALOGRAM REPORT   Patient: Dan Stone       Room #: 3E28C EEG No. ID: [id number] Age: 84 y.o.        Sex: male Referring Physician: Irene LimboGoodrich Report Date:  01/13/2018        Interpreting Physician: Thana FarrEYNOLDS, Omarius Grantham  History: Dan Stone is an 84 y.o. male with syncope evaluated to rule out siezure  Medications:  Lipitor, Plavix, Neurontin, Zoloft, Desyrel  Conditions of Recording:  This is a 21 channel routine scalp EEG performed with bipolar and monopolar montages arranged in accordance to the international 10/20 system of electrode placement. One channel was dedicated to EKG recording.  The patient is in the awake and drowsy states.  Description:  The waking background activity consists of a low voltage, symmetrical, fairly well organized, 8 Hz alpha activity, seen from the parieto-occipital and posterior temporal regions.  Low voltage fast activity, poorly organized, is seen anteriorly and is at times superimposed on more posterior regions.  A mixture of theta and alpha rhythms are seen from the central and temporal regions. The patient drowses with slowing to irregular, low voltage theta and beta activity.   Stage II sleep is not obtained. No epileptiform activity is noted.   Hyperventilation and intermittent photic stimulation were not performed.  IMPRESSION: Normal electroencephalogram, awake and drowsy. There are no focal lateralizing or epileptiform features.   Thana FarrLeslie Lash Matulich, MD Neurology [phone] 01/13/2018, 11:31 AM

## 2018-01-13 NOTE — Progress Notes (Addendum)
Update provided to pt daughter, Daphene JaegerRonita   Pt daughter office number [phone]

## 2018-01-13 NOTE — Telephone Encounter (Signed)
Copied from CRM [id number]. Topic: Quick Communication - See Telephone Encounter >> Jan 13, 2018 10:54 AM Louie BunPalacios Medina, Rosey Batheresa D wrote: CRM for notification. See Telephone encounter for: 01/13/18. Wendie ChessKindra withe [facility] home [facility] called needing verbal orders as follow: extend home health therapy for 2X a week for 4 weeks. Her call back number is [phone].

## 2018-01-13 NOTE — Progress Notes (Signed)
Pt CBG 67  Pt NPO, pt alert and responding to voice Paged MD  MD stated okay to advance to regular diet

## 2018-01-13 NOTE — Progress Notes (Signed)
PROGRESS NOTE  Dan FavorJames Arthur [name] [id number] DOB: 1934/04/21 DOA: 01/11/2018 PCP: Deeann SaintBanks, Shannon R, MD just established care 8/8, [place] from [place]?  Brief Narrative: 84 year [place], dementia, stroke, atrial fibrillation presented with history of syncope outside, while wearing warm clothing.  Reportedly had systolic blood pressures in the 60s per EMS.  Patient confused and vomited after syncopal episode.  Recently treated for pneumonia and [name], still on Levaquin.  Assessment/Plan Syncope.  Second event since July.  Daughter describes seizure-like activity with these events.  Description however does not suggest postictal type phenomenon.  This event occurred while wearing warm clothing outside.  Patient found to be markedly orthostatic in the ED. EEG unremarkable. --No recurrence thus far.  Telemetry with apparent dropped beats intermittently.  Patient asymptomatic.  Cardiology consultation solicited.  Wandering pacemaker. Multifocal atrial tachycardia seen last admission.  Holding atenolol. --Avoid AV nodal agents.  Appears stable.  Follow-up cardiology recommendations.  Moderate to severe aortic stenosis.  Cardiology recommended conservative management with repeat echo 6 months.  --Follow-up as an outpatient.  Hypotension, orthostatic hypotension.  Thought secondary to dehydration. Hold amlodipine, hydrochlorothiazide --Appears stable at this point.  Discontinue IV fluids.  Recheck orthostatics.  Acute encephalopathy. Hold Namenda --Resolved.  Etiology unclear.  EEG unremarkable.  Vascular dementia without behavioral disturbance thought secondary to extensive atherosclerosis  --Appears stable.  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 There are no focal lateralizing or epileptiform features.  Antimicrobials:    Interval history/Subjective: Hypoglycemic this AM, no other events charted. Feels well, eating breakfast.  Objective: Vitals:  Vitals:   01/13/18 0506 01/13/18 0916  BP: 135/78 120/67  Pulse: 68 61  Resp: 18   Temp: 98.7 F (37.1 C)   SpO2: 97%     Exam: Constitutional:   . Appears calm and comfortable, eating breakfast with tech assist ENMT:  . grossly normal hearing  Respiratory:  . CTA bilaterally, no w/r/r.  . Respiratory effort normal. Cardiovascular:  . RRR, 2/6 holosystolic murmur, no r/g . No LE extremity edema   . Telemetry SR, blocked p-waves, bradycardia overnight Musculoskeletal:  . RUE, LUE, RLE, LLE   . Moves all extremities to command, weaker on left than right (chronic) Neurologic:  . No gross change noted Psychiatric:  . Mental status o Mood, affect appropriate  I have personally reviewed the following:   Labs:  CBG 78, earlier 67.  Basic metabolic panel unremarkable.  Imaging studies:    Medical tests:    Review and summation of old records: Discharge 7/8, hospitalized for syncope.  Seen by electrophysiology and general cardiology at that time. Thought to be prolonged hypotension, vagally mediated.  Last cardiology note from Dr. Delton SeeNelson "wandering atrial PM, frequent PACs, short runs of atrial tachycardia, intermittent 2:1 block, no pauses longer than 3 seconds. There is no indication for a PM. I have also reviewed his echocardiogram, his aortic stenosis is moderate to severe, however the patient is not a  surgical candidate, given underlying dementia and being immobile he  is currently not a candidate for a TAVR either. He should be consulted by palliative care/hospice."  However per d/c summary "If repeat syncopal event, referral to EP would be reasonable"  Scheduled Meds: . atorvastatin  40 mg Oral QHS  . brimonidine  1 drop Both Eyes BID  . clopidogrel  75 mg Oral Daily  . dextrose      . enoxaparin (LOVENOX) injection  40 mg Subcutaneous Q24H  . gabapentin  100 mg Oral BID  . sertraline  25 mg Oral BID  . sodium chloride flush  3 mL Intravenous Q12H  . traZODone  50 mg Oral QHS   Continuous Infusions: . sodium chloride 125 mL/hr at 01/12/18 2355    Principal Problem:   Syncope and collapse Active Problems:   Essential hypertension   Dementia   Nonrheumatic aortic valve stenosis   Hypotension   Wandering atrial pacemaker   Aortic stenosis, moderate   Orthostatic hypotension   Syncope   LOS: 1 day

## 2018-01-13 NOTE — Progress Notes (Signed)
EEG complete - results pending 

## 2018-01-13 NOTE — Progress Notes (Signed)
Second time pt took out IV   Pt continually taking off CCMD telemtry monitor   Placed mitts on pt and re oriented to room

## 2018-01-13 NOTE — Progress Notes (Signed)
IV Team: Floor RN was able to establish access. Order canceled.

## 2018-01-13 NOTE — Progress Notes (Signed)
CCMD called stated pt heart rate dropped to 32 and pt had a 4 beat heart block. Pt now 78, non symptomatic. Paged MD to inform. Will continue monitor

## 2018-01-13 NOTE — Progress Notes (Signed)
RN fed pt at bedside  Pt ate 100%, tolerated well

## 2018-01-14 ENCOUNTER — Telehealth: Payer: Self-pay | Admitting: Family Medicine

## 2018-01-14 DIAGNOSIS — I10 Essential (primary) hypertension: Secondary | ICD-10-CM

## 2018-01-14 DIAGNOSIS — I350 Nonrheumatic aortic (valve) stenosis: Secondary | ICD-10-CM

## 2018-01-14 DIAGNOSIS — I441 Atrioventricular block, second degree: Secondary | ICD-10-CM

## 2018-01-14 DIAGNOSIS — R55 Syncope and collapse: Secondary | ICD-10-CM

## 2018-01-14 DIAGNOSIS — I951 Orthostatic hypotension: Principal | ICD-10-CM

## 2018-01-14 LAB — GLUCOSE, CAPILLARY: GLUCOSE-CAPILLARY: 90 mg/dL (ref 70–99)

## 2018-01-14 NOTE — Consult Note (Addendum)
Cardiology Consultation:   Patient ID: Dan Stone; [id number]; 06/20/1933   Admit date: 01/11/2018 Date of Consult: 01/14/2018  Primary Care Provider: Deeann Saint, MD Primary Cardiologist: Dr. Wyline Mood, Dr. [place], Dr. Delton See Primary Electrophysiologist:  Dr. Graciela Husbands   Patient Profile:   Dan Stone is a 84 y.o. male with a hx of HTN, CVA, dementia, severe AS, and previous syncope who is being seen today for the evaluation of syncope at the request of Dr. Irene Limbo.  History of Present Illness:   Dan Stone was recently hospitalized with syncope 12/05/17. EP was consulted during that admission. He was found to have PNA and his syncope was thought to be vagally mediated with dehydration in the setting of severe AS and infection. Pt was also found ot have new right vision loss - found to have central retinal artery occlusion. Questionable cardioembolic event, but TEE was deferred. Telemetry at that time with wandering atrial pacemaker/MAT, but no Afib. No ILR idication. Carotid dopplers without obstructive disease.    Care Everywhere - 01/08/18 was seen at [facility] for syncope with a listed diagnosis of paroxysmal atrial fibrillation. Syncope was thought to be vaso-vagal. Diagnosis of PAF listed. They deferred anticoagulation to primary cardiologist [facility]).  He re-presented back to [facility] 01/11/18 with another syncopal event. Per notes and report, he had a syncopal event while outside wearing warm clothes (hooded sweatshirt and sweat pants). He was out shopping with his daughter and had a syncopal episodes after he got out of the car. He vomited once. First responders report sBP in the 60s, improved with IVF. Per report, questionable seizure-like activity. EEG completed and was unremarkable. He was hydrated with IV fluids, D/C'ed yesterday. Of note, he is finishing ABX for PNA.  On my interview, patient is quite drowsy. He responds minimally to questions. Review of the  chart suggests this has been his baseline. He states that he does not remember the syncopal event. No family at bedside to help with history. Level 5 Caveat.    Past Medical History:  Diagnosis Date  . A-fib (HCC)   . AS (aortic stenosis)   . Blind right eye   . Bradycardia   . Chicken pox   . Dementia   . Frequent urinary tract infections   . Glaucoma   . Hypertension   . PNA (pneumonia)   . Stroke [facility])    5 years ago  . Syncope     Past Surgical History:  Procedure Laterality Date  . EYE SURGERY       Home Medications:  Prior to Admission medications   Medication Sig Start Date End Date Taking? Authorizing Provider  amLODipine (NORVASC) 5 MG tablet Take 1 tablet (5 mg total) by mouth at bedtime. 12/12/17  Yes Pecola Lawless, MD  atorvastatin (LIPITOR) 40 MG tablet Take 1 tablet (40 mg total) by mouth daily at 6 PM. Patient taking differently: Take 40 mg by mouth at bedtime.  12/12/17  Yes Pecola Lawless, MD  brimonidine (ALPHAGAN) 0.2 % ophthalmic solution Place 1 drop into both eyes 2 (two) times daily.   Yes [provider]  clopidogrel (PLAVIX) 75 MG tablet Take 1 tablet (75 mg total) by mouth daily. 12/12/17  Yes Pecola Lawless, MD  gabapentin (NEURONTIN) 100 MG capsule Take 1 capsule (100 mg total) by mouth daily. 12/12/17  Yes Pecola Lawless, MD  hydrochlorothiazide (HYDRODIURIL) 25 MG tablet Take 25 mg by mouth daily.  01/09/18  Yes [provider]  levofloxacin (LEVAQUIN) 750 MG tablet Take 750 mg by mouth daily. 5 day course started 01/09/18 01/09/18 01/14/18 Yes [provider]  memantine (NAMENDA) 10 MG tablet Take 10 mg by mouth 2 (two) times daily.  09/18/17 09/18/18 Yes [provider]  risperiDONE (RISPERDAL) 1 MG tablet Take 1.5 tablets (1.5 mg total) by mouth 2 (two) times daily. Patient taking differently: Take 2 mg by mouth 2 (two) times daily.  12/12/17  Yes Pecola Lawless, MD  sertraline (ZOLOFT) 25 MG tablet Take 25  mg by mouth 2 (two) times daily.    Yes [provider]  timolol (BETIMOL) 0.5 % ophthalmic solution Place 1 drop into both eyes 2 (two) times daily.   Yes [provider]  traZODone (DESYREL) 50 MG tablet Take 1 tablet (50 mg total) by mouth at bedtime. 12/12/17  Yes Pecola Lawless, MD  acetaminophen (TYLENOL) 325 MG tablet Take 2 tablets (650 mg total) by mouth every 6 (six) hours as needed for mild pain (or Fever >/= 101). Patient not taking: Reported on 01/09/2018 12/09/17   [name], MD  aspirin EC 81 MG tablet Take 1 tablet (81 mg total) by mouth daily. Patient not taking: Reported on 01/09/2018 12/09/17 03/09/18  [name], MD    Inpatient Medications: Scheduled Meds: . atorvastatin  40 mg Oral QHS  . brimonidine  1 drop Both Eyes BID  . clopidogrel  75 mg Oral Daily  . enoxaparin (LOVENOX) injection  40 mg Subcutaneous Q24H  . gabapentin  100 mg Oral BID  . sertraline  25 mg Oral BID  . sodium chloride flush  3 mL Intravenous Q12H  . traZODone  50 mg Oral QHS   Continuous Infusions:  PRN Meds: acetaminophen **OR** acetaminophen, alum & mag hydroxide-simeth, bisacodyl, senna-docusate  Allergies:    Allergies  Allergen Reactions  . Xarelto [Rivaroxaban] Other (See Comments)    Per patients family, there is currently a lawsuit against xarelto in pt's name. Bleeding in the bowels    Social History:   Social History   Socioeconomic History  . Marital status: Single    Spouse name: Not on file  . Number of children: Not on file  . Years of education: Not on file  . Highest education level: Not on file  Occupational History  . Not on file  Social Needs  . Financial resource strain: Not on file  . Food insecurity:    Worry: Not on file    Inability: Not on file  . Transportation needs:    Medical: Not on file    Non-medical: Not on file  Tobacco Use  . Smoking status: Never Smoker  . Smokeless tobacco: Never Used  Substance  and Sexual Activity  . Alcohol use: Never    Frequency: Never  . Drug use: Never  . Sexual activity: Not Currently  Lifestyle  . Physical activity:    Days per week: Not on file    Minutes per session: Not on file  . Stress: Not on file  Relationships  . Social connections:    Talks on phone: Not on file    Gets together: Not on file    Attends religious service: Not on file    Active member of club or organization: Not on file    Attends meetings of clubs or organizations: Not on file    Relationship status: Not on file  . Intimate partner violence:    Fear of current  or ex partner: Not on file    Emotionally abused: Not on file    Physically abused: Not on file    Forced sexual activity: Not on file  Other Topics Concern  . Not on file  Social History Narrative  . Not on file    Family History:    Family History  Problem Relation Age of Onset  . CAD Father      ROS:  Please see the history of present illness.   All other ROS reviewed and negative.     Physical Exam/Data:   Vitals:   01/13/18 1221 01/13/18 2002 01/14/18 0115 01/14/18 0510  BP: (!) 120/58 136/76 (!) 169/81 (!) 148/78  Pulse: 65 69 71 76  Resp: 18 18 18 18   Temp: 98.2 F (36.8 C) 98.4 F (36.9 C) 98.9 F (37.2 C) 98.5 F (36.9 C)  TempSrc: Oral Oral Oral Oral  SpO2: 99% 98% 100% 99%  Weight:    91.8 kg  Height:        Intake/Output Summary (Last 24 hours) at 01/14/2018 1212 Last data filed at 01/14/2018 1100 Gross per 24 hour  Intake 2152.91 ml  Output 1600 ml  Net 552.91 ml   Filed Weights   01/12/18 0506 01/13/18 0506 01/14/18 0510  Weight: 88.8 kg 91 kg 91.8 kg   Body mass index is 30.77 kg/m.  General:  Well nourished, well developed, in no acute distress HEENT: normal Neck: no JVD Vascular: Bruits difficult with severe AS murmur Cardiac:  normal S1, S2; RRR; 4/6 aortic murmur Lungs:  Respirations unlabored Abd: soft, nontender, no hepatomegaly  Ext: no  edema Musculoskeletal:  No deformities, BUE and BLE strength normal and equal Skin: warm and dry  Neuro:  CNs 2-12 intact, no focal abnormalities noted Psych:  Normal affect   EKG:  The EKG was personally reviewed and demonstrates:   Telemetry:  Telemetry was personally reviewed and demonstrates:  Sinus, sinus brady, episodes of 2:1 heart block  Relevant CV Studies:  12/05/17 TTE Study Conclusions - Left ventricle: The cavity size was normal. Wall thickness was increased in a pattern of mild LVH. Systolic function was normal. The estimated ejection fraction was in the range of 60% to 65%. Wall motion was normal; there were no regional wall motion abnormalities. Doppler parameters are consistent with abnormal left ventricular relaxation (grade 1 diastolic dysfunction). - Aortic valve: Mildly calcified annulus. Trileaflet; mildly thickened, moderately calcified leaflets. There was severe stenosis. Mean gradient (S): 31 mm Hg. VTI ratio of LVOT to aortic valve: 0.2. Valve area (VTI): 0.69 cm^2. Valve area (Vmean): 0.68 cm^2. - Mitral valve: Mildly calcified annulus. There was trivial regurgitation. - Right atrium: Central venous pressure (est): 3 mm Hg. - Atrial septum: No defect or patent foramen ovale was identified. - Tricuspid valve: There was mild regurgitation. - Pulmonary arteries: PA peak pressure: 27 mm Hg (S). - Pericardium, extracardiac: There was no pericardial effusion.   Laboratory Data:  Chemistry Recent Labs  Lab 01/11/18 1603 01/12/18 0459 01/13/18 0402  NA 140 141 141  K 3.2* 3.8 3.8  CL 104 103 108  CO2 27 28 27   GLUCOSE 93 100* 77  BUN 5* 6* 7*  CREATININE 1.06 1.00 0.95  CALCIUM 8.7* 8.6* 8.0*  GFRNONAA >60 >60 >60  GFRAA >60 >60 >60  ANIONGAP 9 10 6     No results for input(s): PROT, ALBUMIN, AST, ALT, ALKPHOS, BILITOT in the last 168 hours. Hematology Recent Labs  Lab 01/11/18 1603  WBC 4.2  RBC 4.09*  HGB 11.5*  HCT  36.3*  MCV 88.8  MCH 28.1  MCHC 31.7  RDW 13.6  PLT 152   Cardiac EnzymesNo results for input(s): TROPONINI in the last 168 hours.  Recent Labs  Lab 01/11/18 1620  TROPIPOC 0.01    BNPNo results for input(s): BNP, PROBNP in the last 168 hours.  DDimer No results for input(s): DDIMER in the last 168 hours.  Radiology/Studies:  Ct Head Wo Contrast  Result Date: 01/11/2018 CLINICAL DATA:  Syncopal episode while getting out of car today. EXAM: CT HEAD WITHOUT CONTRAST TECHNIQUE: Contiguous axial images were obtained from the base of the skull through the vertex without intravenous contrast. COMPARISON:  December 04, 2017 FINDINGS: Brain: No evidence of acute infarction, hemorrhage, hydrocephalus, extra-axial collection or mass lesion/mass effect. There is chronic diffuse atrophy. Chronic bilateral periventricular white matter small vessel ischemic changes identified. Vascular: No hyperdense vessel or unexpected calcification. Skull: Normal. Negative for fracture or focal lesion. Sinuses/Orbits: No acute finding. Mild mucoperiosteal thickening of bilateral ethmoid sinuses are noted. Other: None. IMPRESSION: No focal acute intracranial abnormality identified. Chronic diffuse atrophy and chronic bilateral periventricular white matter small vessel ischemic change. Electronically Signed   By: Sherian ReinWei-Chen  Lin M.D.   On: 01/11/2018 19:15   Dg Chest Port 1 View  Result Date: 01/11/2018 CLINICAL DATA:  Syncope and vomiting today. EXAM: PORTABLE CHEST 1 VIEW COMPARISON:  December 04, 2017 FINDINGS: The heart size and mediastinal contours are within normal limits. Both lungs are clear. The visualized skeletal structures are stable. IMPRESSION: No active cardiopulmonary disease. Electronically Signed   By: Sherian ReinWei-Chen  Lin M.D.   On: 01/11/2018 17:08    Assessment and Plan:   1. Syncope - there was a question of seizure-like activity, EEG was normal - telemetry reviewed shows evidence of 2:1 heart block and some  sinus bradycardia - EKG with Wenckebach  - orthostatic vitals today significant for hypotension with a 40 point drop in systolic pressure after standing for 3 minutes - differential for this syncopal episode, second since July, includes orthostatic hypotension, severe AS, dehydration, vaso-vagal as he was in warm weather clothes outside - dementia complicates patient history, no family at bedside - no AV nodal blocking agents - caution florinef with AS - stop norvasc and HCTZ - do not restart at discharge, avoid diuretics, OK to run higher pressure - could try midodrine   - consider contribution of SSRI, will defer this to primary   2. Wandering pacemaker, multifocal atrial tachycardia during last admission, 2:1 heart block - telemetry with evidence of 2:1 heart block   3. Hx of CVA - continue plavix   For questions or updates, please contact [facility] Please consult [email or web address] for contact info under Cardiology/STEMI.   Signed, Marcelino Dusterngela Nicole Duke, PA  01/14/2018 12:12 PM  I have seen and examined the patient along with Marcelino DusterAngela Nicole Duke, PA.  I have reviewed the chart, notes and new data.  I agree with PA/NP's note.  Key new complaints: patient is sleepy and will not answer questions. Reportedly this is his baseline. By chart review, he has had 3 syncope events in the last month or so. Mostly, the episodes occur with orthostasis and sound like hypotension. Key examination changes: limited by his ability to cooperate. RRR with occasional short pauses. Loud mid peaking AS murmur. Unable to perform a detailed neuro exam Key new findings / data: telemetry shows frequent episodes of second degree AV block Mobitz type  I, sometimes with mild bradycardia, always with narrow QRS. No pauses >2.5 seconds. Echo with moderate to severe AS  PLAN: Many potential causes for syncope include severe AS and AV block, but the pattern (as ascertained by the chart review) is most compatible with  orthostatic hypotension. Amlodipine and thiazide diuretic have been stopped. They should not be restarted. OK to tolerate SBP as high as 180 to avoid orthostatic syncope. For same reason, would reduce SSRI dose to once daily, maybe even stop altogether. Even if it is proven that AS or 2nd deg AVB are causes of his syncope, he is a very poor candidate for any invasive procedures (including TAVR or PPM) due to the severity of his cognitive dysfunction.  Thurmon FairMihai Nai Dasch, MD, [facility] HospitalFACC [facility] [phone] 01/14/2018, 3:07 PM

## 2018-01-14 NOTE — Evaluation (Signed)
Occupational Therapy Evaluation Patient Details Name: Dan Stone MRN: [id number] DOB: [birth year] Today's Date: 01/14/2018    History of Present Illness Dan Stone is a 84 y.o. male with medical history significant of dementia moderate to severe us aortic stenosis hypertension presents with syncope.  He has a history of recent PNA as well.    Clinical Impression   Pt currently needs mod to max assist for selfcare tasks sit to stand and functional transfers.  Feel he will benefit from acute care OT to help increase overall independence.  Unsure of discharge situation as pt will need 24 hour assist if leaving soon.  Will need SNF follow-up with 24 hour physical assist cannot be arranged.  No family present on eval.    Follow Up Recommendations  SNF(If 24 hour physical assist is not provided)    Equipment Recommendations  Other (comment)(TBD next venue of care or next visit )    Recommendations for Other Services       Precautions / Restrictions Precautions Precautions: Fall Precaution Comments: history of dementia Restrictions Weight Bearing Restrictions: No      Mobility Bed Mobility Overal bed mobility: Needs Assistance Bed Mobility: Supine to Sit;Sit to Supine     Supine to sit: Max assist Sit to supine: Mod assist   General bed mobility comments: Pt needed assist with all aspects of supine to sit and assist with LEs with sit to supine.    Transfers Overall transfer level: Needs assistance Equipment used: Rolling walker (2 wheeled) Transfers: Sit to/from Stand Sit to Stand: Mod assist              Balance Overall balance assessment: Needs assistance   Sitting balance-Leahy Scale: Poor Sitting balance - Comments: LOB posteriorly with initial sitting     Standing balance-Leahy Scale: Poor Standing balance comment: Needs UE support and support of therapist as well with initial standing.                           ADL either  performed or assessed with clinical judgement   ADL Overall ADL's : Needs assistance/impaired Eating/Feeding: Supervision/ safety;Sitting   Grooming: Wash/dry face;Wash/dry hands;Sitting;Set up   Upper Body Bathing: Minimal assistance;Sitting   Lower Body Bathing: Sit to/from stand;Moderate assistance       Lower Body Dressing: Total assistance Lower Body Dressing Details (indicate cue type and reason): total assist for gripper socks Toilet Transfer: Moderate assistance;RW;Ambulation   Toileting- Clothing Manipulation and Hygiene: Maximal assistance;Sit to/from stand       Functional mobility during ADLs: Moderate assistance General ADL Comments: Pt used the RW for mobility to and from the sink with mod assist overall.  Flexed knees in standing with inability to keep them straight.  No family present to give PLOF information but pt will need 24 hour supervision/assist at discharge.      Vision Baseline Vision/History: (right eye blindness or impairment per chart) Patient Visual Report: No change from baseline Vision Assessment?: Yes Eye Alignment: Within Functional Limits Ocular Range of Motion: Within Functional Limits Alignment/Gaze Preference: Within Defined Limits Tracking/Visual Pursuits: Decreased smoothness of vertical tracking;Decreased smoothness of horizontal tracking Saccades: Additional head turns occurred during testing;Additional eye shifts occurred during testing Visual Fields: Other (comment)(impaired on the right and left but difficult to accurately asses)            Pertinent Vitals/Pain Pain Assessment: Faces Pain Score: 0-No pain     Hand Dominance  Extremity/Trunk Assessment Upper Extremity Assessment Upper Extremity Assessment: LUE deficits/detail LUE Deficits / Details: Pt with history of right CVA and LUE hemiparesis.  He demonstrates Brunnstrum stage V movement in the hand and arm with increased flexor tone in the digits and elbow.   LUE  Sensation: decreased light touch LUE Coordination: decreased fine motor;decreased gross motor   Lower Extremity Assessment Lower Extremity Assessment: Defer to PT evaluation   Cervical / Trunk Assessment Cervical / Trunk Assessment: Kyphotic   Communication     Cognition Arousal/Alertness: Awake/alert Behavior During Therapy: WFL for tasks assessed/performed Overall Cognitive Status: No family/caregiver present to determine baseline cognitive functioning                                 General Comments: Pt with history of dementia but unsure if this is his baseline.  Confused continued asking the therapist when did this area turn into to a casino.  Perseverating on winning money and gambling even though [place] could [place]     General Comments                           OT Problem List: Decreased strength;Decreased activity tolerance;Decreased knowledge of use of DME or AE;Decreased cognition;Impaired balance (sitting and/or standing);Decreased safety awareness      OT Treatment/Interventions: Self-care/ADL training;Patient/family education;Balance training;Therapeutic activities;DME and/or AE instruction;Cognitive remediation/compensation    OT Goals(Current goals can be found in the care plan section) Acute Rehab OT Goals Patient Stated Goal: Pt did not state but agreeable to work with therapy. OT Goal Formulation: With patient Time For Goal Achievement: 01/28/18 Potential to Achieve Goals: Good  OT Frequency: Min 2X/week              AM-PAC PT "6 Clicks" Daily Activity     Outcome Measure Help from another person eating meals?: A Little Help from another person taking care of personal grooming?: A Little Help from another person toileting, which includes using toliet, bedpan, or urinal?: A Lot Help from another person bathing (including washing, rinsing, drying)?: A Lot Help from another person to put on  and taking off regular upper body clothing?: A Little Help from another person to put on and taking off regular lower body clothing?: A Lot 6 Click Score: 15   End of Session Equipment Utilized During Treatment: Rolling walker  Activity Tolerance: Patient tolerated treatment well Patient left: in bed;with call bell/phone within reach;with bed alarm set  OT Visit Diagnosis: Unsteadiness on feet (R26.81);Muscle weakness (generalized) (M62.81)                Time: [id number] OT Time Calculation (min): 26 min Charges:  OT General Charges $OT Visit: 1 Visit OT Evaluation $OT Eval Moderate Complexity: 1 Mod OT Treatments $Self Care/Home Management : 8-22 mins   Ajia Chadderdon,[place] OTR/L 01/14/2018, 10:54 AM

## 2018-01-14 NOTE — Telephone Encounter (Signed)
Copied from CRM [id number]. Topic: Quick Communication - See Telephone Encounter >> Jan 14, 2018  1:48 PM Arlyss Gandyichardson, Jarious Lyon N, NT wrote: CRM for notification. See Telephone encounter for: 01/14/18. Temeka with [facility] calling to get orders PT and skilled nursing as well as a home health aide. CB#: [phone]

## 2018-01-14 NOTE — Telephone Encounter (Signed)
Please advise if ok to give verbal orders 

## 2018-01-14 NOTE — Telephone Encounter (Signed)
Copied from CRM [id number]. Topic: Quick Communication - See Telephone Encounter >> Jan 14, 2018  9:13 AM Baldo DaubAlexander, Amber L wrote: CRM for notification. See Telephone encounter for: 01/14/18.  [facility] verbal orders: Speech therapy - 1 week one, 2 week four for cognitive communication deficit related to Alzheimers.  Menda can be reached at [phone]

## 2018-01-14 NOTE — Progress Notes (Addendum)
PROGRESS NOTE  Dan FavorJames Arthur Stone MulchFrieson ZOX:[id number] DOB: 1933/09/18 DOA: 01/11/2018 PCP: Deeann SaintBanks, Shannon R, MD just established care 8/8, moved from [name]?  Brief Narrative: 84 [name], dementia, stroke, atrial fibrillation presented with history of syncope outside, while wearing warm clothing.  Reportedly had systolic blood pressures in the 60s per EMS.  Patient confused and vomited after syncopal episode.  Recently treated for pneumonia and [name], still on Levaquin.  Assessment/Plan Syncope.  Second event since July.  Daughter describes seizure-like activity with these events.  Description however does not suggest postictal type phenomenon.  This event occurred while wearing warm clothing outside.  Patient found to be markedly orthostatic in the ED. EEG unremarkable. --No recurrence thus far.  Patient asymptomatic.  Telemetry suggests transient high-grade heart block. --Await cardiology consultation, discussed again with facilitator this morning, anticipate completion of consult today. --stop trazodone, can cause syncope, arrhythmias and orthostatic hypotension. Risperidone stopped for similar reasoning.  Wandering pacemaker. Multifocal atrial tachycardia seen last admission.   --Not on any rate control agents.  Timolol ophthalmic was discontinued.  Await cardiology recommendations.  Moderate to severe aortic stenosis.  Cardiology recommended conservative management with repeat echo 6 months.  --Follow-up as an outpatient.  Hypotension, orthostatic hypotension.  Thought secondary to dehydration. Hold amlodipine, hydrochlorothiazide --Appears stable.  Acute encephalopathy. Hold Namenda --Resolved.  Etiology unclear.  EEG unremarkable.  No further evaluation suggested.  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  DVT prophylaxis: enoxaparin Code Status: Full Family Communication:  Disposition Plan: pending    Dan Sacksaniel Goodrich, MD  [facility] Direct contact: [phone] --Via amion app OR  --[email or web address]; password [email or web address]  7PM-7AM contact night coverage as above 01/14/2018, 1:05 PM  LOS: 2 days   Consultants:  Cardiology   Procedures:  EEG IMPRESSION: Normal electroencephalogram, awake and drowsy. There are no focal lateralizing or epileptiform features.  Antimicrobials:    Interval history/Subjective: Patient has no complaints.  Feels well.  Eating well.  He did have abnormal telemetry last evening suggestive of transient high-grade heart block.  Objective: Vitals:  Vitals:   01/14/18 0115 01/14/18 0510  BP: (!) 169/81 (!) 148/78  Pulse: 71 76  Resp: 18 18  Temp: 98.9 F (37.2 C) 98.5 F (36.9 C)  SpO2: 100% 99%    Exam Constitutional:   . Appears calm and comfortable Respiratory:  . CTA bilaterally, no w/r/r.  . Respiratory effort normal. Cardiovascular:  . RRR, no m/r/g . No LE extremity edema   Musculoskeletal:  . RUE, LUE, RLE, LLE   . Moves all extremities to command Psychiatric:  . Mental status o Mood, affect appropriate  I have personally reviewed the following:   Labs:  CBG stable  eview and summation of old records: Discharge 7/8, hospitalized for syncope.  Seen by electrophysiology and general cardiology at that time. Thought to be prolonged hypotension, vagally mediated.  Last cardiology note from Dr. Delton SeeNelson "wandering atrial PM, frequent PACs, short runs of atrial tachycardia, intermittent 2:1 block, no pauses longer than 3 seconds. There is no indication for a PM. I have also reviewed his echocardiogram, his aortic stenosis is moderate to severe, however the patient is not a surgical candidate, given underlying dementia  and being immobile he is currently not a candidate for a TAVR either. He should be consulted  by palliative care/hospice."  However per d/c summary "If repeat syncopal event, referral to EP would be reasonable"  Scheduled Meds: . atorvastatin  40 mg Oral QHS  . brimonidine  1 drop Both Eyes BID  . clopidogrel  75 mg Oral Daily  . enoxaparin (LOVENOX) injection  40 mg Subcutaneous Q24H  . gabapentin  100 mg Oral BID  . sertraline  25 mg Oral BID  . sodium chloride flush  3 mL Intravenous Q12H  . traZODone  50 mg Oral QHS   Continuous Infusions:   Principal Problem:   Syncope and collapse Active Problems:   Essential hypertension   Dementia   Nonrheumatic aortic valve stenosis   Hypotension   Wandering atrial pacemaker   Aortic stenosis, moderate   Orthostatic hypotension   Syncope   LOS: 2 days

## 2018-01-14 NOTE — Telephone Encounter (Signed)
Please Advise if ok to give verbal orders 

## 2018-01-15 ENCOUNTER — Other Ambulatory Visit: Payer: Self-pay | Admitting: Internal Medicine

## 2018-01-15 LAB — GLUCOSE, CAPILLARY
GLUCOSE-CAPILLARY: 67 mg/dL — AB (ref 70–99)
Glucose-Capillary: 72 mg/dL (ref 70–99)

## 2018-01-15 LAB — BASIC METABOLIC PANEL
Anion gap: 5 (ref 5–15)
BUN: 7 mg/dL — ABNORMAL LOW (ref 8–23)
CALCIUM: 8.2 mg/dL — AB (ref 8.9–10.3)
CO2: 27 mmol/L (ref 22–32)
CREATININE: 0.91 mg/dL (ref 0.61–1.24)
Chloride: 108 mmol/L (ref 98–111)
GFR calc non Af Amer: >60 mL/min (ref >60)
Glucose, Bld: 183 mg/dL — ABNORMAL HIGH (ref 70–99)
Potassium: 3.4 mmol/L — ABNORMAL LOW (ref 3.5–5.1)
SODIUM: 140 mmol/L (ref 135–145)

## 2018-01-15 LAB — CBC
HEMATOCRIT: 35.5 % — AB (ref 39.0–52.0)
Hemoglobin: 11.1 g/dL — ABNORMAL LOW (ref 13.0–17.0)
MCH: 27.7 pg (ref 26.0–34.0)
MCHC: 31.3 g/dL (ref 30.0–36.0)
MCV: 88.5 fL (ref 78.0–100.0)
PLATELETS: 160 10*3/uL (ref 150–400)
RBC: 4.01 MIL/uL — ABNORMAL LOW (ref 4.22–5.81)
RDW: 13.5 % (ref 11.5–15.5)
WBC: 4.5 10*3/uL (ref 4.0–10.5)

## 2018-01-15 LAB — BLOOD GAS, ARTERIAL
Acid-Base Excess: 2.5 mmol/L — ABNORMAL HIGH (ref 0.0–2.0)
Bicarbonate: 26.5 mmol/L (ref 20.0–28.0)
Drawn by: 519031
FIO2: 21
O2 Saturation: 93.3 %
PCO2 ART: 39.7 mmHg (ref 32.0–48.0)
PO2 ART: 65.2 mmHg — AB (ref 83.0–108.0)
Patient temperature: 98.1
pH, Arterial: 7.437 (ref 7.350–7.450)

## 2018-01-15 LAB — AMMONIA: Ammonia: 32 umol/L (ref 9–35)

## 2018-01-15 LAB — TSH: TSH: 1.341 u[IU]/mL (ref 0.350–4.500)

## 2018-01-15 LAB — VITAMIN B12: VITAMIN B 12: 195 pg/mL (ref 180–914)

## 2018-01-15 MED ORDER — ENSURE ENLIVE PO LIQD
237.0000 mL | Freq: Two times a day (BID) | ORAL | Status: DC
  Administered 2018-01-15 – 2018-01-16 (×2): 237 mL via ORAL

## 2018-01-15 MED ORDER — DEXTROSE 50 % IV SOLN
25.0000 mL | Freq: Once | INTRAVENOUS | Status: AC
  Administered 2018-01-15: 25 mL via INTRAVENOUS

## 2018-01-15 MED ORDER — SERTRALINE HCL 50 MG PO TABS
25.0000 mg | ORAL_TABLET | Freq: Every day | ORAL | Status: DC
  Administered 2018-01-15 – 2018-01-20 (×6): 25 mg via ORAL
  Filled 2018-01-15 (×6): qty 1

## 2018-01-15 MED ORDER — VITAMIN B-12 100 MCG PO TABS
100.0000 ug | ORAL_TABLET | Freq: Every day | ORAL | Status: DC
  Administered 2018-01-15 – 2018-01-20 (×6): 100 ug via ORAL
  Filled 2018-01-15 (×6): qty 1

## 2018-01-15 MED ORDER — DEXTROSE 50 % IV SOLN
INTRAVENOUS | Status: AC
  Filled 2018-01-15: qty 50

## 2018-01-15 MED ORDER — POTASSIUM CHLORIDE 10 MEQ/100ML IV SOLN
10.0000 meq | INTRAVENOUS | Status: AC
Start: 2018-01-15 — End: 2018-01-15
  Administered 2018-01-15 (×3): 10 meq via INTRAVENOUS
  Filled 2018-01-15 (×3): qty 100

## 2018-01-15 NOTE — Telephone Encounter (Signed)
VO given earlier today on this pt.  See other phone note.  He is in the hospital.

## 2018-01-15 NOTE — Evaluation (Addendum)
Physical Therapy Evaluation Patient Details Name: Dan Stone MRN: [id number] DOB: August 17, 1933 Today's Date: 01/15/2018   History of Present Illness  84 year [place], dementia, stroke, atrial fibrillation presented with history of syncope outside, while wearing warm clothing. Reportedly had systolic blood pressures in the 60s per EMS. Patient confused and vomited after syncopal episode. Recently treated for pneumonia and [name], still on Levaquin.     Clinical Impression  Pt admitted with above diagnosis. Pt currently with functional limitations due to the deficits listed below (see PT Problem List). Patient confused unreliable historian with hx of dementia, unclear if he at baseline cognitively at time of PT eval. Pt reports he lives with his wife and can walk without AD. Upon eval pt presents with L sided weakness. Did not become dizzy with positional changes, however posterior lean noted in standing. Mod A to stand and min guard for safety with side stepping, ambulation deferred due to patient safety. No family present, however if 24/7 assistance available patient may be able to progress to baseline and return home. Pt will benefit from skilled PT to increase their independence and safety with mobility to allow discharge to the venue listed below.    Vitals SpO2 WNL on RA HR 40-65 this visit BP Supine 117/58 Sitting 139/92 Standing 138/85    Follow Up Recommendations SNF;Supervision/Assistance - 24 hour (pending progress)    Equipment Recommendations  (TBD)    Recommendations for Other Services       Precautions / Restrictions Precautions Precautions: Fall Precaution Comments: history of dementia Restrictions Weight Bearing Restrictions: No      Mobility  Bed Mobility Overal bed mobility: Needs Assistance Bed Mobility: Supine to Sit;Sit to Supine     Supine to sit: Mod assist Sit to supine: Mod  assist   General bed mobility comments: Pt needed assist with all aspects of supine to sit and assist with LEs with sit to supine.    Transfers Overall transfer level: Needs assistance Equipment used: Rolling walker (2 wheeled) Transfers: Sit to/from Stand Sit to Stand: Mod assist         General transfer comment: Mod A to stand, took several steps up to head of bed with min guard, poorly controlled descent back onto bed.   Ambulation/Gait                Stairs            Wheelchair Mobility    Modified Rankin (Stroke Patients Only)       Balance Overall balance assessment: Needs assistance   Sitting balance-Leahy Scale: Poor       Standing balance-Leahy Scale: Poor Standing balance comment: Needs UE support and support of therapist as well with initial standing, posterior lean.                             Pertinent Vitals/Pain Pain Assessment: No/denies pain    Home Living Family/patient expects to be discharged to:: Private residence                 Additional Comments: No family available for PLOF and pt confused with history of dementia.    Prior Function                 Hand Dominance        Extremity/Trunk Assessment   Upper Extremity Assessment Upper Extremity Assessment: Defer to OT  evaluation;Generalized weakness(LUE< RUE, grossly 3+/5 ) LUE Deficits / Details: pt with hx of right CVA    Lower Extremity Assessment Lower Extremity Assessment: (RLE WN, LLE 2/5 strength grossly)       Communication   Communication: No difficulties  Cognition Arousal/Alertness: Awake/alert Behavior During Therapy: WFL for tasks assessed/performed Overall Cognitive Status: No family/caregiver present to determine baseline cognitive functioning                                 General Comments: unsure what level of baseline, pt AO to person only. following commands 80% of time with increased time        General Comments      Exercises     Assessment/Plan    PT Assessment Patient needs continued PT services  PT Problem List Decreased strength;Decreased range of motion;Decreased activity tolerance;Decreased balance;Decreased mobility;Decreased coordination;Decreased cognition;Decreased knowledge of use of DME;Decreased safety awareness;Decreased knowledge of precautions;Cardiopulmonary status limiting activity       PT Treatment Interventions Gait training;DME instruction;Stair training;Functional mobility training;Therapeutic activities;Therapeutic exercise;Balance training    PT Goals (Current goals can be found in the Care Plan section)  Acute Rehab PT Goals Patient Stated Goal: Pt did not state but agreeable to work with therapy. PT Goal Formulation: With patient Time For Goal Achievement: 01/29/18 Potential to Achieve Goals: Good    Frequency Min 3X/week   Barriers to discharge        Co-evaluation               AM-PAC PT "6 Clicks" Daily Activity  Outcome Measure Difficulty turning over in bed (including adjusting bedclothes, sheets and blankets)?: Unable Difficulty moving from lying on back to sitting on the side of the bed? : Unable Difficulty sitting down on and standing up from a chair with arms (e.g., wheelchair, bedside commode, etc,.)?: Unable Help needed moving to and from a bed to chair (including a wheelchair)?: A Lot Help needed walking in hospital room?: A Lot Help needed climbing 3-5 steps with a railing? : Total 6 Click Score: 8    End of Session Equipment Utilized During Treatment: Gait belt Activity Tolerance: Patient limited by lethargy Patient left: in bed;with call bell/phone within reach Nurse Communication: Mobility status PT Visit Diagnosis: Unsteadiness on feet (R26.81);Muscle weakness (generalized) (M62.81);Difficulty in walking, not elsewhere classified (R26.2);Hemiplegia and hemiparesis Hemiplegia - Right/Left: Left Hemiplegia -  caused by: Cerebral infarction    Time: 1540-1620 PT Time Calculation (min) (ACUTE ONLY): 40 min   Charges:   PT Evaluation $PT Eval Moderate Complexity: 1 Mod PT Treatments $Therapeutic Activity: 8-22 mins        Etta GrandchildSean Keni Wafer, PT, DPT Acute Rehab Services Pager: [phone]    Etta GrandchildSean Didi Ganaway 01/15/2018, 4:29 PM

## 2018-01-15 NOTE — Progress Notes (Signed)
PROGRESS NOTE    Dan FavorJames Arthur Lara Stone  [id number] DOB: [birth year] DOA: 01/11/2018 PCP: Deeann SaintBanks, Shannon R, MD   Brief Narrative: Brief Narrative: 84 year old [place], dementia, stroke, atrial fibrillation presented with history of syncope outside, while wearing warm clothing.  Reportedly had systolic blood pressures in the 60s per EMS.  Patient confused and vomited after syncopal episode.  Recently treated for pneumonia and [name], still on Levaquin.  Assessment & Plan:   Principal Problem:   Syncope and collapse Active Problems:   Essential hypertension   Dementia   Nonrheumatic aortic valve stenosis   Hypotension   Wandering atrial pacemaker   Aortic stenosis, moderate   Orthostatic hypotension   Syncope  1-Syncope;  Evaluated by cardiology, syncope is thought to be related to orthostatic hypotension. Stop trazodone.  Hold BP medications.   Wandering Pacemaker MAT and 2;1 AV block ; Not candidate to pacemaker due to cognitive dysfunction.   Acute encephalopathy;  Patient was more sleepy this am. Mentation fluctuates.  He is more alert this afternoon on my second round.  EEG done on admission, was negative for seizure like activities.  B 12 low normal. Start supplement.  RPRP ordered.  TSH normal. Ammonia negative.  Old left side weakness.  ABG hypoxemia. placed on 2 L oxygen.   History of CVA;  Continue with plavix and statin.   Moderate to severe aortic stenosis.  Cardiology recommended conservative management with repeat echo 6 months.   Orthostatic hypotension;  Related to dehydration.  Hold BP medications.   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   DVT prophylaxis: Lovenox Code Status: full code.  Family Communication; unable to reach daughter  over phone Disposition Plan: SNF  Consultants:   Cardiology   Procedures: EEG;    Antimicrobials: none   Subjective: He was lethargic this am, kept eyes close, say few words, back to sleep.  Subsequently wake up more this afternoon. Unclear baseline.    Objective: Vitals:   01/15/18 0359 01/15/18 0700 01/15/18 1017 01/15/18 1200  BP: (!) 108/53  (!) (P) 112/55 135/69  Pulse: (!) 54  (P) 63 (!) 44  Resp: 18  (P) 16   Temp: 98.1 F (36.7 C)  (P) 98.6 F (37 C) 98.2 F (36.8 C)  TempSrc: Oral  (P) Oral Oral  SpO2: 100%  (P) 94% 100%  Weight:  97.2 kg    Height:        Intake/Output Summary (Last 24 hours) at 01/15/2018 1513 Last data filed at 01/15/2018 1412 Gross per 24 hour  Intake 600 ml  Output 1150 ml  Net -550 ml   Filed Weights   01/13/18 0506 01/14/18 0510 01/15/18 0700  Weight: 91 kg 91.8 kg 97.2 kg    Examination:  General exam: Appears calm and comfortable  Respiratory system: Clear to auscultation. Respiratory effort normal. Cardiovascular system: S1 & S2 heard, RRR. No JVD, murmurs, rubs, gallops or clicks. No pedal edema. Gastrointestinal system: Abdomen is nondistended, soft and nontender. No organomegaly or masses felt. Normal bowel sounds heard. Central nervous system: Alert left side weakness Extremities: Symmetric 5 x 5 power. Skin: No rashes, lesions or ulcers    Data Reviewed: I have personally reviewed following labs and imaging studies  CBC: Recent Labs  Lab 01/11/18 1603 01/15/18 0948  WBC 4.2 4.5  HGB 11.5* 11.1*  HCT 36.3* 35.5*  MCV 88.8 88.5  PLT 152 160   Basic Metabolic Panel: Recent Labs  Lab 01/11/18 1603 01/12/18 0459 01/13/18 0402 01/15/18 0948  NA 140 141 141 140  K 3.2* 3.8 3.8 3.4*  CL 104 103 108 108  CO2 27 28 27 27   GLUCOSE 93 100* 77 183*  BUN 5* 6* 7* 7*  CREATININE 1.06 1.00 0.95 0.91  CALCIUM 8.7* 8.6* 8.0* 8.2*   GFR: Estimated Creatinine Clearance: 71.9 mL/min (by C-G formula based on SCr  of 0.91 mg/dL). Liver Function Tests: No results for input(s): AST, ALT, ALKPHOS, BILITOT, PROT, ALBUMIN in the last 168 hours. No results for input(s): LIPASE, AMYLASE in the last 168 hours. Recent Labs  Lab 01/15/18 0948  AMMONIA 32   Coagulation Profile: No results for input(s): INR, PROTIME in the last 168 hours. Cardiac Enzymes: No results for input(s): CKTOTAL, CKMB, CKMBINDEX, TROPONINI in the last 168 hours. BNP (last 3 results) No results for input(s): PROBNP in the last 8760 hours. HbA1C: No results for input(s): HGBA1C in the last 72 hours. CBG: Recent Labs  Lab 01/13/18 0747 01/13/18 0816 01/14/18 0823 01/15/18 0741 01/15/18 0848  GLUCAP 67* 78 90 67* 72   Lipid Profile: No results for input(s): CHOL, HDL, LDLCALC, TRIG, CHOLHDL, LDLDIRECT in the last 72 hours. Thyroid Function Tests: Recent Labs    01/15/18 0948  TSH 1.341   Anemia Panel: Recent Labs    01/15/18 0948  VITAMINB12 195   Sepsis Labs: No results for input(s): PROCALCITON, LATICACIDVEN in the last 168 hours.  No results found for this or any previous visit (from the past 240 hour(s)).       Radiology Studies: No results found.      Scheduled Meds: . atorvastatin  40 mg Oral QHS  . brimonidine  1 drop Both Eyes BID  . clopidogrel  75 mg Oral Daily  . enoxaparin (LOVENOX) injection  40 mg Subcutaneous Q24H  . feeding supplement (ENSURE ENLIVE)  237 mL Oral BID BM  . gabapentin  100 mg Oral BID  . sertraline  25 mg Oral Daily  . sodium chloride flush  3 mL Intravenous Q12H   Continuous Infusions:   LOS: 3 days    Time spent: 35 minutes.     Alba CoryBelkys A Regalado, MD [facility] Pager [phone]  If 7PM-7AM, please contact night-coverage [email or web address] Password [email or web address] 01/15/2018, 3:13 PM

## 2018-01-15 NOTE — Telephone Encounter (Signed)
That's fine, but pt is currently in the hospital.

## 2018-01-15 NOTE — Progress Notes (Addendum)
Progress Note  Patient Name: Dan Stone Date of Encounter: 01/15/2018  Primary Cardiologist: Dr. Bufford ButtnerMehta [facility]  Subjective   Drowsy and answering questions with one word answers. No complaints of chest pain or SOB. No new issues overnight and he reports sleeping well. Has not been out of bed today.   Inpatient Medications    Scheduled Meds: . atorvastatin  40 mg Oral QHS  . brimonidine  1 drop Both Eyes BID  . clopidogrel  75 mg Oral Daily  . enoxaparin (LOVENOX) injection  40 mg Subcutaneous Q24H  . feeding supplement (ENSURE ENLIVE)  237 mL Oral BID BM  . gabapentin  100 mg Oral BID  . sertraline  25 mg Oral Daily  . sodium chloride flush  3 mL Intravenous Q12H   Continuous Infusions:  PRN Meds: acetaminophen **OR** acetaminophen, alum & mag hydroxide-simeth, bisacodyl, senna-docusate   Vital Signs    Vitals:   01/14/18 1650 01/14/18 2027 01/15/18 0359 01/15/18 0700  BP: (!) 145/94 (!) 132/59 (!) 108/53   Pulse: (!) 57 (!) 53 (!) 54   Resp:  18 18   Temp:  97.6 F (36.4 C) 98.1 F (36.7 C)   TempSrc:  Oral Oral   SpO2: 100% 94% 100%   Weight:    97.2 kg  Height:        Intake/Output Summary (Last 24 hours) at 01/15/2018 0935 Last data filed at 01/15/2018 0857 Gross per 24 hour  Intake 360 ml  Output 1300 ml  Net -940 ml   Filed Weights   01/13/18 0506 01/14/18 0510 01/15/18 0700  Weight: 91 kg 91.8 kg 97.2 kg    Telemetry    2:1 AV block - Personally Reviewed  Physical Exam   GEN: No acute distress. Drowsy and answering questions with one word answers Neck: No JVD, no carotid bruits Cardiac: IRIR, no murmurs, rubs, or gallops.  Respiratory: Clear to auscultation bilaterally, no wheezes/ rales/ rhonchi GI: NABS, Soft, nontender, non-distended  MS: No edema; No deformity. Neuro:  Nonfocal, moving all extremities spontaneously Psych: Normal affect   Labs    Chemistry Recent Labs  Lab 01/11/18 1603 01/12/18 0459 01/13/18 0402    NA 140 141 141  K 3.2* 3.8 3.8  CL 104 103 108  CO2 27 28 27   GLUCOSE 93 100* 77  BUN 5* 6* 7*  CREATININE 1.06 1.00 0.95  CALCIUM 8.7* 8.6* 8.0*  GFRNONAA >60 >60 >60  GFRAA >60 >60 >60  ANIONGAP 9 10 6      Hematology Recent Labs  Lab 01/11/18 1603  WBC 4.2  RBC 4.09*  HGB 11.5*  HCT 36.3*  MCV 88.8  MCH 28.1  MCHC 31.7  RDW 13.6  PLT 152    Cardiac EnzymesNo results for input(s): TROPONINI in the last 168 hours.  Recent Labs  Lab 01/11/18 1620  TROPIPOC 0.01     BNPNo results for input(s): BNP, PROBNP in the last 168 hours.   DDimer No results for input(s): DDIMER in the last 168 hours.   Radiology    No results found.  Cardiac Studies   12/05/17 TTE Study Conclusions - Left ventricle: The cavity size was normal. Wall thickness wasincreased in a pattern of mild LVH. Systolic function was normal.The estimated ejection fraction was in the range of 60% to 65%.Wall motion was normal; there were no regional wall motionabnormalities. Doppler parameters are consistent with abnormalleft ventricular relaxation (grade 1 diastolic dysfunction). - Aortic valve: Mildly calcified annulus. Trileaflet; mildly  thickened, moderately calcified leaflets. There was severe stenosis. Mean gradient (S): 31 mm Hg. VTI ratio of LVOT toaortic valve: 0.2. Valve area (VTI): 0.69 cm^2. Valve area(Vmean): 0.68 cm^2. - Mitral valve: Mildly calcified annulus. There was trivial regurgitation. - Right atrium: Central venous pressure (est): 3 mm Hg. - Atrial septum: No defect or patent foramen ovale was identified. - Tricuspid valve: There was mild regurgitation. - Pulmonary arteries: PA peak pressure: 27 mm Hg (S). - Pericardium, extracardiac: There was no pericardial effusion.  Patient Profile     84 y.o. male with a hx of HTN, CVA, dementia, severe AS, and previous syncope who is being followed by cardiology for the evaluation of syncope  Assessment & Plan    1.  Syncope: orthostatic vitals were grossly positive yesterday making this the likely cause of syncopal episode, however differential still includes vaso-vagal, severe AS, AV block, and ?seizure. Give severity of his cognitive dysfunction, it is felt that he would be a poor candidate for invasive procedures (TAVR or PPM). Home amlodipine and HCTZ were discontinued in favor of permissive HTN in the setting of orthostasis. SSRI was decreased to once daily dosing. - Could consider midodrine  - Will add compression stockings - No further cardiac work-up at this time.   2. Wandering pacemaker, MAT, and 2:1 AV block: all have been noted on telemetry from prior admissions; predominantly 2:1 AV block this admission. EKG this morning again with 2nd degree AV block type 1.  Felt to not be a candidate for PPM given severe cognitive dysfunction.  3. History of CVA: - Continue plavix and statin  Would likely benefit from goals of care discussion with family to determine code status.   [facility] will sign off.   Medication Recommendations:  Discontinue amlodipine and HCTZ. Consider adding midodrine if orthostasis persists Other recommendations (labs, testing, etc):  None Follow up as an outpatient:  He can follow-up with Dr. Bufford ButtnerMehta outpatient as needed.   For questions or updates, please contact [facility] Please consult [email or web address] for contact info under Cardiology/STEMI.      Signed, Beatriz StallionKrista M. Kroeger, PA-C  01/15/2018, 9:35 AM   [phone]  I have seen and examined the patient along with Beatriz StallionKrista M. Kroeger, PA-C.  I have reviewed the chart, notes and new data.  I agree with PA/NP's note.   PLAN: Despite multiple cardiac problems, orthostatic hypotension appears to be the dominant cause for syncope. This is best addressed by allowing higher supine BP (would tolerate even SBP 180) by stopping antihypertensives. Unfortunately, not a candidate for other therapies due to comorbid conditions,  advanced cognitive decline and poor functional status.  Dan FairMihai Georgina Krist, MD, [facility] Medical CenterFACC [facility] [phone] 01/15/2018, 12:35 PM

## 2018-01-15 NOTE — Care Management Important Message (Signed)
Important Message  Patient Details  Name: Dan AlandJames Arthur Stone MRN: [id number] Date of Birth: August 28, 1933   Medicare Important Message Given:  Yes    Xzayvier Fagin P Heber Hoog 01/15/2018, 9:15 AM

## 2018-01-16 ENCOUNTER — Inpatient Hospital Stay (HOSPITAL_COMMUNITY): Payer: Medicare Other

## 2018-01-16 LAB — BASIC METABOLIC PANEL
Anion gap: 8 (ref 5–15)
BUN: 7 mg/dL — ABNORMAL LOW (ref 8–23)
CALCIUM: 8.7 mg/dL — AB (ref 8.9–10.3)
CO2: 25 mmol/L (ref 22–32)
CREATININE: 0.88 mg/dL (ref 0.61–1.24)
Chloride: 106 mmol/L (ref 98–111)
GFR calc Af Amer: >60 mL/min (ref >60)
GFR calc non Af Amer: >60 mL/min (ref >60)
GLUCOSE: 138 mg/dL — AB (ref 70–99)
Potassium: 3.8 mmol/L (ref 3.5–5.1)
Sodium: 139 mmol/L (ref 135–145)

## 2018-01-16 LAB — GLUCOSE, CAPILLARY
GLUCOSE-CAPILLARY: 60 mg/dL — AB (ref 70–99)
GLUCOSE-CAPILLARY: 68 mg/dL — AB (ref 70–99)
GLUCOSE-CAPILLARY: 116 mg/dL — AB (ref 70–99)
Glucose-Capillary: 101 mg/dL — ABNORMAL HIGH (ref 70–99)
Glucose-Capillary: 109 mg/dL — ABNORMAL HIGH (ref 70–99)
Glucose-Capillary: 125 mg/dL — ABNORMAL HIGH (ref 70–99)

## 2018-01-16 LAB — CBC
HCT: 38.5 % — ABNORMAL LOW (ref 39.0–52.0)
Hemoglobin: 12.3 g/dL — ABNORMAL LOW (ref 13.0–17.0)
MCH: 28.3 pg (ref 26.0–34.0)
MCHC: 31.9 g/dL (ref 30.0–36.0)
MCV: 88.5 fL (ref 78.0–100.0)
PLATELETS: 173 10*3/uL (ref 150–400)
RBC: 4.35 MIL/uL (ref 4.22–5.81)
RDW: 13.4 % (ref 11.5–15.5)
WBC: 5.1 10*3/uL (ref 4.0–10.5)

## 2018-01-16 LAB — CORTISOL-AM, BLOOD: Cortisol - AM: 10 ug/dL (ref 6.7–22.6)

## 2018-01-16 MED ORDER — SODIUM CHLORIDE 0.9 % IV SOLN
INTRAVENOUS | Status: DC
  Administered 2018-01-16 – 2018-01-19 (×3): via INTRAVENOUS

## 2018-01-16 MED ORDER — ENSURE ENLIVE PO LIQD
237.0000 mL | Freq: Three times a day (TID) | ORAL | Status: DC
  Administered 2018-01-16 – 2018-01-20 (×10): 237 mL via ORAL

## 2018-01-16 MED ORDER — DEXTROSE 50 % IV SOLN: 1.0000 | Freq: Once | INTRAVENOUS | Status: DC

## 2018-01-16 MED ORDER — SODIUM CHLORIDE 0.9 % IV SOLN
3.0000 g | Freq: Three times a day (TID) | INTRAVENOUS | Status: DC
  Administered 2018-01-16 – 2018-01-19 (×9): 3 g via INTRAVENOUS
  Filled 2018-01-16 (×10): qty 3

## 2018-01-16 MED ORDER — GABAPENTIN 100 MG PO CAPS: 100.0000 mg | ORAL_CAPSULE | Freq: Every day | ORAL | Status: DC

## 2018-01-16 MED ORDER — GABAPENTIN 100 MG PO CAPS
100.0000 mg | ORAL_CAPSULE | Freq: Every day | ORAL | Status: DC
  Administered 2018-01-17 – 2018-01-20 (×4): 100 mg via ORAL
  Filled 2018-01-16 (×4): qty 1

## 2018-01-16 MED ORDER — LEVETIRACETAM 500 MG PO TABS: 500.0000 mg | ORAL_TABLET | Freq: Two times a day (BID) | ORAL | Status: DC

## 2018-01-16 MED ORDER — DEXTROSE 50 % IV SOLN
INTRAVENOUS | Status: AC
  Filled 2018-01-16: qty 50

## 2018-01-16 MED ORDER — LEVETIRACETAM 500 MG PO TABS
500.0000 mg | ORAL_TABLET | Freq: Two times a day (BID) | ORAL | Status: DC
  Administered 2018-01-16 – 2018-01-20 (×9): 500 mg via ORAL
  Filled 2018-01-16 (×10): qty 1

## 2018-01-16 NOTE — Consult Note (Addendum)
Neurology Consultation  Reason for Consult: Syncope Referring Physician: Dr. Sunnie Nielsen  CC: Syncope  History is obtained from chart  HPI: Dan Stone is a 84 y.o. 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 in August 2018, new central retinal artery occlusion on the right in July 2019, possible listed diagnosis of paroxysmal atrial fibrillation in January 08, 2018 care everywhere no one chart, severe aortic stenosis, sinus bradycardia with 2 is to 1 heart block, is admitted for evaluation of a syncopal episode.  Patient is not able to provide any meaningful history. History is obtained from chart review. He was brought in on 01/11/2018 due to a syncopal episode at home.  According to the history and physical, he was outside during most of the day and pretty warm weather, got hot and syncopized.  EMS arrived and his systolic blood pressures were in the 60s.  After receiving IV fluids his pressures improved.  He also had an episode of vomiting in the field.  Patient did not appear to be at his baseline per family and was admitted, for further work-up. He has been evaluated by cardiology.  He was found to have Tuesday 1 heart block, severe aortic stenosis and orthostatic hypotension with at least a 40 point drop in blood pressure upon standing. Neurological consultation was obtained upon insistence of the family regarding pursuing any neurological causes for his clinical presentation. There is no history of documented seizures.  He has exhibited behavioral disturbances over the course of the past few months to years as documented in his neurology notes from [facility]. No family available at bedside.  No documentation of new focal  neurological deficits.  ROS:  Unable to obtain due to altered mental status.   Past Medical History:  Diagnosis Date  . A-fib (HCC)   . AS (aortic stenosis)   . Blind right eye   . Bradycardia   . Chicken pox   . Dementia   . Frequent urinary tract infections   . Glaucoma   . Hypertension   . PNA (pneumonia)   . Stroke [facility])    5 years ago  . Syncope     Family History  Problem Relation Age of Onset  . CAD Father    Social History:   reports that he has never smoked. He has never used smokeless tobacco. He reports that he does not drink alcohol or use drugs.  Medications  Current Facility-Administered Medications:  .  acetaminophen (TYLENOL) tablet 650 mg, 650 mg, Oral, Q6H PRN **OR** acetaminophen (TYLENOL) suppository 650 mg, 650 mg, Rectal, Q6H PRN, Johnson-Pitts, Endia, MD .  alum & mag hydroxide-simeth (MAALOX/MYLANTA) 200-200-20 MG/5ML suspension 30 mL, 30 mL, Oral, Q6H PRN, Johnson-Pitts, Endia, MD .  atorvastatin (LIPITOR) tablet 40 mg, 40 mg, Oral, QHS, Johnson-Pitts, Endia, MD, 40 mg at 01/15/18 2245 .  bisacodyl (DULCOLAX) suppository 10 mg, 10 mg, Rectal, Daily PRN, Johnson-Pitts, Endia, MD .  brimonidine (ALPHAGAN) 0.2 % ophthalmic solution 1 drop, 1 drop, Both Eyes, BID, Johnson-Pitts, Endia, MD, 1 drop at 01/15/18 2245 .  clopidogrel (PLAVIX) tablet 75 mg, 75 mg, Oral, Daily, Johnson-Pitts, Endia, MD, 75 mg at 01/15/18 1131 .  dextrose 50 % solution 50 mL, 1 ampule, Intravenous, Once, Regalado, Belkys A, MD .  dextrose 50 %  solution, , , ,  .  enoxaparin (LOVENOX) injection 40 mg, 40 mg, Subcutaneous, Q24H, Johnson-Pitts, Endia, MD, 40 mg at 01/15/18 2032 .  feeding supplement (ENSURE ENLIVE) (ENSURE ENLIVE) liquid 237 mL, 237 mL, Oral, BID BM, Regalado, Belkys A, MD, 237 mL at 01/15/18 1526 .  gabapentin (NEURONTIN) capsule 100 mg, 100 mg, Oral, BID, Johnson-Pitts, Endia, MD, 100 mg at 01/15/18 2245 .  senna-docusate (Senokot-S) tablet 1 tablet, 1 tablet,  Oral, QHS PRN, Johnson-Pitts, Endia, MD .  sertraline (ZOLOFT) tablet 25 mg, 25 mg, Oral, Daily, Regalado, Belkys A, MD, 25 mg at 01/15/18 2329 .  sodium chloride flush (NS) 0.9 % injection 3 mL, 3 mL, Intravenous, Q12H, Johnson-Pitts, Endia, MD, 3 mL at 01/15/18 2200 .  vitamin B-12 (CYANOCOBALAMIN) tablet 100 mcg, 100 mcg, Oral, Daily, Regalado, Belkys A, MD, 100 mcg at 01/15/18 1757  Exam: Current vital signs: BP (!) 149/50 (BP Location: Right Arm)   Pulse 69   Temp 98.6 F (37 C) (Oral)   Resp 18   Ht 5\' 8"  (1.727 m)   Wt 94 kg   SpO2 98%   BMI 31.51 kg/m  Vital signs in last 24 hours: Temp:  [98.2 F (36.8 C)-98.6 F (37 C)] 98.6 F (37 C) (08/15 0336) Pulse Rate:  [44-81] 69 (08/15 0336) Resp:  [16-18] 18 (08/15 0336) BP: (112-150)/(50-82) 149/50 (08/15 0336) SpO2:  [94 %-100 %] 98 % (08/15 0336) Weight:  [94 kg] 94 kg (08/15 0336) General: Awake alert in no distress HEENT: Normocephalic atraumatic dry mucous membranes Lungs: Clear to auscultation Cardiovascular: Systolic murmur, regular rate rhythm Abdomen: Nondistended nontender Extremities: Warm well perfused Neurological exam Patient awake, alert, oriented to self. He could not tell me the name of the hospital.  He could not name the president.  He could not tell me the month of the year. He has poor attention concentration.  Could not spell the word world backwards correctly. Has poor recall and recollection forwards and events. Cranial nerves: Pupils equal round react light, extra ocular movements intact, visual fields full, subtle left lower facial weakness, auditory acuity grossly intact, palate elevates symmetrically, shoulder shrug intact, tongue midline. Motor exam: 4/5 left upper and lower extremity with vertical drift in the left lower extremity upon attempting to raise it above the bed.  5/5 right upper and right lower extremity.  No drift on the right side. Sensory exam: Intact light touch Cerebellar  exam: Intact finger-nose-finger Gait exam was deferred at this time.  Orthostatic vitals on 01/12/2018 Blood pressure lying 101/68, pulse 80 Blood pressure sitting 103/75, pulse 89 Blood pressure standing 63/52, pulse 115/min   Labs I have reviewed labs in epic and the results pertinent to this consultation are: CBC    Component Value Date/Time   WBC 4.5 01/15/2018 0948   RBC 4.01 (L) 01/15/2018 0948   HGB 11.1 (L) 01/15/2018 0948   HCT 35.5 (L) 01/15/2018 0948   PLT 160 01/15/2018 0948   MCV 88.5 01/15/2018 0948   MCH 27.7 01/15/2018 0948   MCHC 31.3 01/15/2018 0948   RDW 13.5 01/15/2018 0948   LYMPHSABS 0.9 12/05/2017 0454   MONOABS 0.5 12/05/2017 0454   EOSABS 0.0 12/05/2017 0454   BASOSABS 0.0 12/05/2017 0454    CMP     Component Value Date/Time   NA 140 01/15/2018 0948   K 3.4 (L) 01/15/2018 0948   CL 108 01/15/2018 0948   CO2 27 01/15/2018 0948   GLUCOSE 183 (H) 01/15/2018 [id number]  BUN 7 (L) 01/15/2018 0948   CREATININE 0.91 01/15/2018 0948   CALCIUM 8.2 (L) 01/15/2018 0948   PROT 5.9 (L) 12/05/2017 0454   ALBUMIN 2.8 (L) 12/05/2017 0454   AST 20 12/05/2017 0454   ALT 13 12/05/2017 0454   ALKPHOS 75 12/05/2017 0454   BILITOT 1.6 (H) 12/05/2017 0454   GFRNONAA >60 01/15/2018 0948   GFRAA >60 01/15/2018 0948   Imaging I have reviewed the images obtained  MRI examination of the brain done on 12/08/2017 demonstrates 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  Absent right vertebral artery flow. MR angiogram of the head shows occluded right vertebral artery but otherwise patent vertebrobasilar system.  Moderate atheromatous irregularity throughout the intracranial circulation without proximal high-grade stenosis  CT scan of the head done on admission on 01/11/2018 with no acute changes.  Chronic diffuse atrophy and chronic bilateral para ventricular white matter  disease.  EEG done on 01/13/2018 was normal.  Assessment:  84 year old man past medical history of hypertension, stroke with baseline left-sided weakness which has been worsening over the past few months to years, vascular dementia, central retinal artery occlusion in July 2019, possible proximal atrial fibrillation, severe aortic stenosis, sinus bradycardia with just 1 heart block and severe orthostatic hypotension is being seen in consultation for syncope. Given his clinical exam, he does have moderate dementia.  He does have establish care outpatient and should follow-up with outpatient neurology for that. There is no description of seizure or seizure-like events.  No documented history of bowel bladder incontinence. Patient is a dementia do have an increased tendency to have seizures but without a clear description or any EEG abnormalities, I would not start him on antiepileptics.  Furthermore, the MRI shows widespread chronic white matter disease but does not show much of cortical injury from prior strokes as his prior stroke was in the brainstem. Description of the events and evaluation by cardiology makes orthostatic hypotension in the setting of aortic stenosis is the most likely etiology. I do not find any new deficits on his exam to prompt me to do another MRI.  Impression Cardiogenic syncope secondary to multitude of causes as listed above. Vascular dementia-outpatient follow-up Less likely seizures  Recommendations: I would recommend correction of the orthostatic hypotension as recommended by cardiology including physical measures such as compression stockings and optimization of medications that can cause orthostatic hypotension including limiting SSRIs and other pharmacological measures as recommended by cardiology.  At this time, I would not recommend further imaging of the brain as his MRI with an MRA of the head were recently done and were unremarkable for acute change.  He has  established outpatient follow-up for his dementia.  He should follow-up with his outpatient neurologist.  If there are any concerns based on more history that might be obtainable by family, which I was unable to do at this time, about any seizure-like activity or any behavioral changes concerning for seizures, at that point starting an antiepileptic can be a consideration.  I would defer this to patient's outpatient neurologist with whom he has a long-standing relationship.  Neurology services will be available as needed. Please call with questions.  -- Milon Dikes, MD [facility] Pager: [phone] If 7pm to 7am, please call on call as listed on AMION.   Addendum I have been called by the patient's primary hospitalist as well as Dr. Jacinto Halim informing me that the patient's daughter, who they  spoke with over the phone, has informed him that he has had some episodes concerning for seizures in [place] and also in the hospital. I attempted to reach the daughter on the number in the chart as well as the number provided to me by Dr. Jacinto HalimGanji.  I am waiting for callback at this time. I will update my recommendations based on my conversation with the patient daughter.   Addendum 1500 hrs. on 01/16/2018 I had a detailed conversation with the patient's daughter, Dan Stone over the phone. The patient has been only living with her for 1 week. He was living with other siblings in [place] prior to this. There is report of multiple episodes of eyes rolling back and possible shaking but she has only witnessed 1 of those in [place]. She does report witnessing more lethargy while the patient is being admitted here. The patient's daughter was not aware of his extent of cognitive decline as the patient had gone [facility] with his wife, who he does not live with anymore. The daughter was very upset that he has not been hooked up to long-term EEG  to capture seizure events.  I had a detailed conversation with her and told her that given the description of multiple episodes of shaking, I will consider starting him on antiepileptics but I do not see a role of long-term EEG monitoring based on my exam and description of events, and also a lot of cardiac factors that may be contributing to the clinical presentation.  Impression: -Syncopal episodes-cardiogenic versus seizure.  Unclear history. - Given history of dementia, patient's dementia have higher incidence of seizures, will recommend starting antiepileptics.  Here are my updated set of recommendations for this patient after communication with his daughter:  -Start patient on Keppra 500 twice daily -Maintain seizure precautions -He will need get established with a neurologist locally.  Patient now lives in [place].  [facility].  He could be referred to either [facility] or [facility]-for first available appointment follow-up as dementia. -The daughter also would like recommendation or referral for an orthopedic physician for his back pain. - I have answered all the questions that the daughter had for me.  She told me she will be in the hospital in the later part of the day.  I will meet with her personally if possible. -I have also encouraged the daughter to obtain patient's medical records from [facility] so that they can have a good sense of the prior testing done for dementia.  Please call neurology with questions  -- Milon DikesAshish Kathya Wilz, MD [facility] Pager: [phone] If 7pm to 7am, please call on call as listed on AMION.

## 2018-01-16 NOTE — Progress Notes (Signed)
Pharmacy Antibiotic Note  Dan Stone is a 84 y.o. male admitted on 01/11/2018 with loss of consiousness.  Pharmacy has been consulted for Unasyn dosing for possible CAP for which he had been receiving oral antibiotics prior to admit.  Plan: Unasyn 3gm IV every 8 hours F/U creatinine and renal function Monitor culture data and clinical response  Height: 5\' 8"  (172.7 cm) Weight: 207 lb 3.7 oz (94 kg) IBW/kg (Calculated) : 68.4  Temp (24hrs), Avg:98.4 F (36.9 C), Min:98.2 F (36.8 C), Max:98.6 F (37 C)  Recent Labs  Lab 01/11/18 1603 01/12/18 0459 01/13/18 0402 01/15/18 0948 01/16/18 0911 01/16/18 0959  WBC 4.2  --   --  4.5  --  5.1  CREATININE 1.06 1.00 0.95 0.91 0.88  --     Estimated Creatinine Clearance: 73.2 mL/min (by C-G formula based on SCr of 0.88 mg/dL).    Allergies  Allergen Reactions  . Xarelto [Rivaroxaban] Other (See Comments)    Per patients family, there is currently a lawsuit against xarelto in pt's name. Bleeding in the bowels    Antimicrobials this admission: Unasyn 8/15>>   Microbiology results: (no cultures ordered yet)  Thank you for allowing pharmacy to be a part of this patient's care.  Nadara MustardNita Sharnay Cashion, PharmD., MS Clinical Pharmacist Pager:  [phone] Thank you for allowing pharmacy to be part of this patients care team. 01/16/2018 12:14 PM

## 2018-01-16 NOTE — Consult Note (Addendum)
CARDIOLOGY CONSULT NOTE  Patient ID: Dan Stone MRN: [id number] DOB/AGE: 1934-01-03 84 y.o.  Admit date: 01/11/2018 Referring Physician  Carney Harder, MD Primary Physician:  Billie Ruddy, MD Reason for Consultation  Syncope  HPI: Dan Stone  is a 84 y.o. male  With Patient with advanced dementia who was living in [place] recently moved in with his daughter here in [place], admitted on 12/04/2017 with syncope and again on 01/08/2018 evaluated in the ED felt to be due to  respiratory infection and  and dehydration, again admitted on 01/11/2018 with syncope.  Patient's daughter had taken him shopping and while he was trying to get out of the car he started feeling weak, nauseous and he threw up and followed by frank syncope and was brought to the emergency room.  When the EMS arrived he was found to be hypotensive.  He was seen by Dr. Sanda Klein who did an extensive consultation and evaluation and made recommendation that he probably will benefit from pacemaker implantation, due to patient's advanced dementia, severe aortic stenosis, severe orthostatic hypotension which can explain his presentation along with dehydration.  However patient's daughter wanted a second opinion hence I was called upon.  History from the patient is difficult to be obtained however he is pleasantly confused but able to keep on a conversation.  Most of the history is obtained through chart review.  Past Medical History:  Diagnosis Date  . A-fib ([place])   . AS (aortic stenosis)   . Blind right eye   . Bradycardia   . Chicken pox   . Dementia   . Frequent urinary tract infections   . Glaucoma   . Hypertension   . PNA (pneumonia)   . Stroke [facility])    5 years ago  . Syncope      Past Surgical History:  Procedure Laterality Date  . EYE SURGERY       Family History  Problem Relation Age of Onset  . CAD Father      Social History: Social History   Socioeconomic History  . Marital  status: Single    Spouse name: Not on file  . Number of children: Not on file  . Years of education: Not on file  . Highest education level: Not on file  Occupational History  . Not on file  Social Needs  . Financial resource strain: Not on file  . Food insecurity:    Worry: Not on file    Inability: Not on file  . Transportation needs:    Medical: Not on file    Non-medical: Not on file  Tobacco Use  . Smoking status: Never Smoker  . Smokeless tobacco: Never Used  Substance and Sexual Activity  . Alcohol use: Never    Frequency: Never  . Drug use: Never  . Sexual activity: Not Currently  Lifestyle  . Physical activity:    Days per week: Not on file    Minutes per session: Not on file  . Stress: Not on file  Relationships  . Social connections:    Talks on phone: Not on file    Gets together: Not on file    Attends religious service: Not on file    Active member of club or organization: Not on file    Attends meetings of clubs or organizations: Not on file    Relationship status: Not on file  . Intimate partner violence:    Fear of current or ex partner: Not on  file    Emotionally abused: Not on file    Physically abused: Not on file    Forced sexual activity: Not on file  Other Topics Concern  . Not on file  Social History Narrative  . Not on file     Medications Prior to Admission  Medication Sig Dispense Refill Last Dose  . amLODipine (NORVASC) 5 MG tablet Take 1 tablet (5 mg total) by mouth at bedtime. 30 tablet 0 01/10/2018 at pm  . atorvastatin (LIPITOR) 40 MG tablet Take 1 tablet (40 mg total) by mouth daily at 6 PM. (Patient taking differently: Take 40 mg by mouth at bedtime. ) 30 tablet 0 Past Week at pm  . brimonidine (ALPHAGAN) 0.2 % ophthalmic solution Place 1 drop into both eyes 2 (two) times daily.   01/10/2018 at pm  . clopidogrel (PLAVIX) 75 MG tablet Take 1 tablet (75 mg total) by mouth daily. 30 tablet 0 01/11/2018 at 1100  . gabapentin (NEURONTIN)  100 MG capsule Take 1 capsule (100 mg total) by mouth daily. 30 capsule 0 01/11/2018 at am  . hydrochlorothiazide (HYDRODIURIL) 25 MG tablet Take 25 mg by mouth daily.    01/11/2018 at am  . [EXPIRED] levofloxacin (LEVAQUIN) 750 MG tablet Take 750 mg by mouth daily. 5 day course started 01/09/18   01/11/2018 at am  . memantine (NAMENDA) 10 MG tablet Take 10 mg by mouth 2 (two) times daily.    01/11/2018 at am  . risperiDONE (RISPERDAL) 1 MG tablet Take 1.5 tablets (1.5 mg total) by mouth 2 (two) times daily. (Patient taking differently: Take 2 mg by mouth 2 (two) times daily. ) 32 tablet 0 01/11/2018 at am  . sertraline (ZOLOFT) 25 MG tablet Take 25 mg by mouth 2 (two) times daily.    01/11/2018 at am  . timolol (BETIMOL) 0.5 % ophthalmic solution Place 1 drop into both eyes 2 (two) times daily.   01/10/2018 at 2100  . traZODone (DESYREL) 50 MG tablet Take 1 tablet (50 mg total) by mouth at bedtime. 21 tablet 0 01/10/2018 at pm  . acetaminophen (TYLENOL) 325 MG tablet Take 2 tablets (650 mg total) by mouth every 6 (six) hours as needed for mild pain (or Fever >/= 101). (Patient not taking: Reported on 01/09/2018) 30 tablet 0 Not Taking at Unknown time  . aspirin EC 81 MG tablet Take 1 tablet (81 mg total) by mouth daily. (Patient not taking: Reported on 01/09/2018) 30 tablet 2 Not Taking at Unknown time   ROS: Limited due to patient denying any complaints at this time.  Physical Exam: Blood pressure (!) 149/50, pulse 69, temperature 98.6 F (37 C), temperature source Oral, resp. rate 18, height '5\' 8"'$  (1.727 m), weight 94 kg, SpO2 98 %.  Physical Exam  Constitutional: He appears well-developed and well-nourished. No distress.  HENT:  Head: Atraumatic.  Eyes: Conjunctivae are normal.  Neck: Neck supple. No JVD present. No thyromegaly present.  Cardiovascular: Normal rate.  Murmur (2/6 crescendo midsystolic murmur at the apex and right sternal border.  No gallop or rub.) heard. Frequent ectopy.   Pulmonary/Chest: Effort normal and breath sounds normal.  Abdominal: Soft. Bowel sounds are normal.  Musculoskeletal: He exhibits no edema or tenderness.  Neurological: He is alert.  He was able to state that he is in [place] but does not hospital, confused about his residence.   Skin: Skin is warm and dry.  Labs:   Lab Results  Component Value Date   WBC  5.1 01/16/2018   HGB 12.3 (L) 01/16/2018   HCT 38.5 (L) 01/16/2018   MCV 88.5 01/16/2018   PLT 173 01/16/2018    Recent Labs  Lab 01/16/18 0911  NA 139  K 3.8  CL 106  CO2 25  BUN 7*  CREATININE 0.88  CALCIUM 8.7*  GLUCOSE 138*    Lipid Panel     Component Value Date/Time   CHOL 239 (H) 12/06/2017 0617   TRIG 74 12/06/2017 0617   HDL 44 12/06/2017 0617   CHOLHDL 5.4 12/06/2017 0617   VLDL 15 12/06/2017 0617   LDLCALC 180 (H) 12/06/2017 0617    BNP (last 3 results) Recent Labs    12/04/17 2321  BNP 172.2*    HEMOGLOBIN A1C Lab Results  Component Value Date   HGBA1C 5.6 12/06/2017   MPG 114.02 12/06/2017    Cardiac Panel (last 3 results) Recent Labs    12/05/17 0454 12/05/17 1034 12/05/17 1910  TROPONINI <0.03 <0.03 <0.03   TSH Recent Labs    12/05/17 0454 01/15/18 0948  TSH 1.712 1.341    Radiology: Dg Chest 2 View  Result Date: 01/16/2018 CLINICAL DATA:  Patient diagnosed with atrial fibrillation 01/08/2018. Bradycardia and syncope. EXAM: CHEST - 2 VIEW COMPARISON:  Single-view of the chest 01/11/2018. PA and lateral chest 12/04/2017. FINDINGS: The left hemidiaphragm is obscured consistent with left basilar airspace disease. The right lung is clear. No pneumothorax or pleural effusion. Heart size is mildly enlarged. Aortic atherosclerosis is noted. Degenerative disease about the right shoulder in thoracic spine. No acute or focal bony abnormality. IMPRESSION: Left basilar airspace disease which could be atelectasis or pneumonia. Electronically Signed   By: Inge Rise M.D.   On:  01/16/2018 10:46    Scheduled Meds: . atorvastatin  40 mg Oral QHS  . brimonidine  1 drop Both Eyes BID  . clopidogrel  75 mg Oral Daily  . dextrose  1 ampule Intravenous Once  . dextrose      . enoxaparin (LOVENOX) injection  40 mg Subcutaneous Q24H  . feeding supplement (ENSURE ENLIVE)  237 mL Oral BID BM  . gabapentin  100 mg Oral BID  . sertraline  25 mg Oral Daily  . sodium chloride flush  3 mL Intravenous Q12H  . vitamin B-12  100 mcg Oral Daily   Continuous Infusions: PRN Meds:.acetaminophen **OR** acetaminophen, alum & mag hydroxide-simeth, bisacodyl, senna-docusate  CARDIAC STUDIES: EKG 01/13/2018 Multiple EKGs reviewed since admission, he has had brief episode of Wenckebach phenomena otherwise poor R wave progression and nonspecific T abnormality.  Episodes of nonconducted PACs and sinus arrest of less than 1.5 seconds.  No high degree AV block, no other significant arrhythmias, no atrial fibrillation.  ECHO: 12/05/2017: Normal LV systolic function, EF 60 to 65%.  Moderate to severe aortic stenosis with a mean gradient of 31 mmHg, calculated aortic valve of 0.69 cm.  ASSESSMENT AND PLAN:  1.  Syncope secondary to severe orthostatic hypotension and compounded by moderate to severe aortic stenosis. 2.  History of atrial fibrillation, no documentation available, was put on Xarelto per history, had severe GI bleed. CHA2DS2-VASCScore: Risk Score  5, (hypertension, age greater than 37, history of CVA in the past) 3.  Advanced dementia  Recommendation: I have reviewed all the notes, I also discussed with Dr. Frederic Jericho (Primary team) and Dr. Amie Portland (Neuro), that the situation is extremely complex and although seizure disorder is a possibility, he has 2 conditions going on including syncope that is  separate from possible seizures.  At most I would only recommend an event monitor as there is no indication for pacemaker.  His syncope can be explained by severe orthostatic  hypotension compounded by moderate to severe aortic stenosis and dehydration.  I completely agree with Dr. Dani Gobble Croitoru ([place]) about his excellent evaluation and detailed consult note.  I also agree with him that we should hold off on antihypertensive medications in view of frequent syncope and fall and possible seizures.  I also discussed with the daughter regarding anticoagulation.  Advised her that he would be asked extremely high risk for bleeding with recurrent fall and also with history of GI bleed on Xarelto hence would not recommend long-term anticoagulation.  Not much that I can offer for the patient, would recommend conservative therapy only in view of advanced dementia and I had a long discussion with neurology Dr. Rory Percy and patient already had established severe dementia a year ago.  Orthostatics were rechecked again and clearly severely orthostatic.  Support stockings while he is on his feet and also midodrine 5 to 10 mg 3 times daily while awake and up on his feet would also be options.  Otherwise would not recommend any further evaluation. This was a 90-minute consultation, cross reference of multiple medical charts, discussion among physicians and also telephone call with the patient's daughter.  Adrian Prows, MD 01/16/2018, 10:55 AM [facility] Pager: [phone] Office: [phone] If no answer Cell [phone]  Addendum: I met patient and his daughter at 645pm until 7:30 PM and discussed with them regarding his condition. SURPRISINGLY HE WAS VERY APPROPRIATE AND ASKED ME WHAT HE SHOULD EXPECT REGARDING DAILY ACTIVITY, DAY CARE THAT HE IS ATTENDING DAILY.  Daughter was taking him for daily walks around the house as well.  We again discussed anticoagulants. My only recommendation is Midodrine 5-10 mg prior to going out and prior to going to day care. Flurinef should be avoided in view of  moderte to severe AS and may precipitate CHF. Explained that  dementia can lead to orthostatic hypotension and also need for use of Support stocking. Lucid periods also explained. Very difficult situation.

## 2018-01-16 NOTE — Progress Notes (Signed)
Physical Therapy Treatment Patient Details Name: Dan Stone MRN: [id number] DOB: 12-13-1933 Today's Date: 01/16/2018    History of Present Illness 84 year old [place], dementia, stroke, atrial fibrillation presented with history of syncope outside, while wearing warm clothing. Reportedly had systolic blood pressures in the 60s per EMS. Patient confused and vomited after syncopal episode. Recently treated for pneumonia and [name], still on Levaquin.    PT Comments    Patient progressing with therapy today. Cognitive deficits still noted, but following commands 90% of time. Ambulated 4740' with close chair follow with cues for improved mechanics and base of support. No family present today to confirm level of assistance at home so SNF recs cont to be appropriate.     Follow Up Recommendations  SNF;Supervision/Assistance - 24 hour     Equipment Recommendations  (TBD)    Recommendations for Other Services       Precautions / Restrictions Precautions Precautions: Fall Precaution Comments: history of dementia Restrictions Weight Bearing Restrictions: No    Mobility  Bed Mobility Overal bed mobility: Needs Assistance Bed Mobility: Supine to Sit     Supine to sit: Mod assist;+2 for physical assistance     General bed mobility comments: OOB in chair at entry  Transfers Overall transfer level: Needs assistance Equipment used: Rolling walker (2 wheeled) Transfers: Sit to/from Stand Sit to Stand: Mod assist;+2 physical assistance         General transfer comment: modA for sit to stand given increased time, cueing for hand placement and sequencing   Ambulation/Gait Ambulation/Gait assistance: Min assist;Min guard Gait Distance (Feet): 40 Feet Assistive device: Rolling walker (2 wheeled) Gait Pattern/deviations: Step-to pattern;Narrow base of support Gait velocity: decreased   General Gait Details: pt  with narrow BOS   Stairs             Wheelchair Mobility    Modified Rankin (Stroke Patients Only)       Balance Overall balance assessment: Needs assistance Sitting-balance support: Bilateral upper extremity supported;Feet supported Sitting balance-Leahy Scale: Fair Sitting balance - Comments: maintained static sitting balance EOB with SPV   Standing balance support: Bilateral upper extremity supported;During functional activity Standing balance-Leahy Scale: Poor Standing balance comment: relaint on B UE support                             Cognition Arousal/Alertness: Awake/alert Behavior During Therapy: WFL for tasks assessed/performed Overall Cognitive Status: No family/caregiver present to determine baseline cognitive functioning Area of Impairment: Orientation;Attention;Memory;Following commands;Safety/judgement;Awareness;Problem solving                 Orientation Level: Disoriented to;Place;Time;Situation Current Attention Level: Sustained Memory: Decreased recall of precautions;Decreased short-term memory Following Commands: Follows one step commands inconsistently;Follows one step commands with increased time Safety/Judgement: Decreased awareness of safety;Decreased awareness of deficits Awareness: Intellectual Problem Solving: Slow processing;Decreased initiation;Difficulty sequencing;Requires verbal cues;Requires tactile cues General Comments: unsure of baseline, no family available      Exercises      General Comments        Pertinent Vitals/Pain Pain Assessment: No/denies pain    Home Living                      Prior Function            PT Goals (current goals can now be found in the care plan section) Acute Rehab PT Goals  Patient Stated Goal: Pt did not state but agreeable to work with therapy. PT Goal Formulation: With patient Time For Goal Achievement: 01/29/18 Potential to Achieve Goals: Good Progress  towards PT goals: Progressing toward goals    Frequency    Min 3X/week      PT Plan Current plan remains appropriate    Co-evaluation              AM-PAC PT "6 Clicks" Daily Activity  Outcome Measure  Difficulty turning over in bed (including adjusting bedclothes, sheets and blankets)?: Unable Difficulty moving from lying on back to sitting on the side of the bed? : Unable Difficulty sitting down on and standing up from a chair with arms (e.g., wheelchair, bedside commode, etc,.)?: Unable Help needed moving to and from a bed to chair (including a wheelchair)?: A Lot Help needed walking in hospital room?: A Lot Help needed climbing 3-5 steps with a railing? : Total 6 Click Score: 8    End of Session Equipment Utilized During Treatment: Gait belt Activity Tolerance: Patient limited by lethargy Patient left: in bed;with call bell/phone within reach Nurse Communication: Mobility status PT Visit Diagnosis: Unsteadiness on feet (R26.81);Muscle weakness (generalized) (M62.81);Difficulty in walking, not elsewhere classified (R26.2);Hemiplegia and hemiparesis Hemiplegia - Right/Left: Left Hemiplegia - caused by: Cerebral infarction     Time: 1400-1420 PT Time Calculation (min) (ACUTE ONLY): 20 min  Charges:  $Gait Training: 8-22 mins                    Dan Stone, PT, DPT Acute Rehab Services Pager: [phone]    Dan Stone 01/16/2018, 2:22 PM

## 2018-01-16 NOTE — Progress Notes (Signed)
Neurologist at bedside to speak with daughter per request.

## 2018-01-16 NOTE — Plan of Care (Signed)
°  Problem: Clinical Measurements: °Goal: Ability to maintain clinical measurements within normal limits will improve °Outcome: Progressing °  °Problem: Clinical Measurements: °Goal: Diagnostic test results will improve °Outcome: Progressing °  °

## 2018-01-16 NOTE — Telephone Encounter (Signed)
Called Dan Stone left a detailed message with Dr Salomon FickBanks approval for the verbal orders for Speech therapy

## 2018-01-16 NOTE — Plan of Care (Signed)
  Problem: Clinical Measurements: Goal: Diagnostic test results will improve Outcome: Progressing   Problem: Safety: Goal: Ability to remain free from injury will improve Outcome: Progressing   Problem: Skin Integrity: Goal: Risk for impaired skin integrity will decrease Outcome: Progressing   

## 2018-01-16 NOTE — Progress Notes (Signed)
PROGRESS NOTE    Dan FavorJames Arthur [name]  ZOX:[id number] DOB: [birth year] DOA: 01/11/2018 PCP: Deeann SaintBanks, Shannon R, MD   Brief Narrative: Brief Narrative: 84 year old [place], dementia, stroke, atrial fibrillation presented with history of syncope outside, while wearing warm clothing.  Reportedly had systolic blood pressures in the 60s per EMS.  Patient confused and vomited after syncopal episode.  Recently treated for pneumonia and [name], and was still on Levaquin. Chest x ray on admission was negative for PNA.   Patient had unresponsive episode in the hospital on 8-11, he regain consciousness. He had an EEG done that was negative for seizure. His syncope episode was thought to be related to orthostatic hypotension. For this reason his BP medications were discontinue. He was notice to have 2;1 AV block. Cardiology does not recommend pacemaker.   Patient on 8-14, was more sleepy than usual, kept eyes close but was answering questions. I got blood gas, which was negative for hypercapnia, did showed hypoxemia. He was place on oxygen. Ammonia level normal, B 12 low normal. B 12 supplements started. RPR pending. Neurology has been consulted.   Second opinion from cardiology requested by patient's daughter. Dr Nadara EatonGangi consulted. He agrees that syncope likely related to orthostatic hypotension. patient will need Holter monitor. Q shift of orthostatic vitals.    Assessment & Plan:   Principal Problem:   Syncope and collapse Active Problems:   Essential hypertension   Dementia   Nonrheumatic aortic valve stenosis   Hypotension   Wandering atrial pacemaker   Aortic stenosis, moderate   Orthostatic hypotension   Syncope  1-Syncope;  Evaluated by cardiology, syncope is thought to be related to orthostatic hypotension. Stop trazodone.  Hold BP medications.  Follow orthostatic vitals Q shift.  Resume low rate IV fluids.  If orthostatics  persist might need midodrine.  Zoloft decreased to once a day.  Second opinion for cardiology per family request.   Wandering Pacemaker MAT and 2;1 AV block ; Dr Nadara EatonGangi recommends Holter monitor.   Hypoglycemia;  Cortisol level normal.  Checking sulfonyl urea, insulin level, ce peptide.  Check cbg Q 4 hours.  Snack at bedtime.  TSH normal.   Acute encephalopathy;  Patient was more sleepy the morning of 8-14. Mentation fluctuates.  He was more alert this afternoon on my second round.  EEG done on admission, was negative for seizure like activities.  B 12 low normal. Started  supplement.  RPR ordered.  TSH normal. Ammonia negative.  Old left side weakness.  ABG hypoxemia. placed on 2 L oxygen.  Neurology consulted.   PNA; per daughter patient has been coughing. She is worry that patient didn't finished antibiotics.  -repeated chest x ray; showed atelectasis vs PNA/  -start empirically antibiotics due to cough, and encephalopathy.   History of CVA;  Continue with plavix and statin.   Moderate to severe aortic stenosis.  Cardiology recommended conservative management with repeat echo 6 months.   Orthostatic hypotension;  Related to dehydration.  Hold BP medications.  Resume IV fluids.   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     DVT prophylaxis: Lovenox Code Status: full code.  Family Communication; daughter updated  Disposition Plan: SNF  Consultants:   Cardiology   Procedures: EEG;    Antimicrobials: none   Subjective: Patient was alert this am. pleasantly confused. Didn't know he was  at hospital. Denies pain.  He has been fed by staff.   Objective: Vitals:   01/15/18 1017 01/15/18 1200 01/15/18 1940 01/16/18 0336  BP: (!) 112/55 135/69 (!) 150/82 (!) 149/50  Pulse: 63 (!) 44 81 69  Resp: 16  18 18   Temp:  98.6 F (37 C) 98.2 F (36.8 C) 98.2 F (36.8 C) 98.6 F (37 C)  TempSrc: Oral Oral Oral Oral  SpO2: 94% 100% 100% 98%  Weight:    94 kg  Height:        Intake/Output Summary (Last 24 hours) at 01/16/2018 0932 Last data filed at 01/16/2018 0557 Gross per 24 hour  Intake 600.93 ml  Output 1000 ml  Net -399.07 ml   Filed Weights   01/14/18 0510 01/15/18 0700 01/16/18 0336  Weight: 91.8 kg 97.2 kg 94 kg    Examination:  General exam: NAD Respiratory system: CTA Cardiovascular system:  S 1, S 2 RRR Gastrointestinal system: BS present, soft, nt Central nervous system: alert, chronic left side weakness.  Extremities: no edema Skin: no rashes.     Data Reviewed: I have personally reviewed following labs and imaging studies  CBC: Recent Labs  Lab 01/11/18 1603 01/15/18 0948  WBC 4.2 4.5  HGB 11.5* 11.1*  HCT 36.3* 35.5*  MCV 88.8 88.5  PLT 152 160   Basic Metabolic Panel: Recent Labs  Lab 01/11/18 1603 01/12/18 0459 01/13/18 0402 01/15/18 0948  NA 140 141 141 140  K 3.2* 3.8 3.8 3.4*  CL 104 103 108 108  CO2 27 28 27 27   GLUCOSE 93 100* 77 183*  BUN 5* 6* 7* 7*  CREATININE 1.06 1.00 0.95 0.91  CALCIUM 8.7* 8.6* 8.0* 8.2*   GFR: Estimated Creatinine Clearance: 70.8 mL/min (by C-G formula based on SCr of 0.91 mg/dL). Liver Function Tests: No results for input(s): AST, ALT, ALKPHOS, BILITOT, PROT, ALBUMIN in the last 168 hours. No results for input(s): LIPASE, AMYLASE in the last 168 hours. Recent Labs  Lab 01/15/18 0948  AMMONIA 32   Coagulation Profile: No results for input(s): INR, PROTIME in the last 168 hours. Cardiac Enzymes: No results for input(s): CKTOTAL, CKMB, CKMBINDEX, TROPONINI in the last 168 hours. BNP (last 3 results) No results for input(s): PROBNP in the last 8760 hours. HbA1C: No results for input(s): HGBA1C in the last 72 hours. CBG: Recent Labs  Lab 01/15/18 0741 01/15/18 0848 01/16/18 0749 01/16/18 0824 01/16/18 0904    GLUCAP 67* 72 68* 60* 101*   Lipid Profile: No results for input(s): CHOL, HDL, LDLCALC, TRIG, CHOLHDL, LDLDIRECT in the last 72 hours. Thyroid Function Tests: Recent Labs    01/15/18 0948  TSH 1.341   Anemia Panel: Recent Labs    01/15/18 0948  VITAMINB12 195   Sepsis Labs: No results for input(s): PROCALCITON, LATICACIDVEN in the last 168 hours.  No results found for this or any previous visit (from the past 240 hour(s)).       Radiology Studies: No results found.      Scheduled Meds: . atorvastatin  40 mg Oral QHS  . brimonidine  1 drop Both Eyes BID  . clopidogrel  75 mg Oral Daily  . dextrose  1 ampule Intravenous Once  . dextrose      . enoxaparin (LOVENOX) injection  40 mg Subcutaneous Q24H  . feeding supplement (ENSURE ENLIVE)  237 mL Oral BID BM  . gabapentin  100 mg Oral BID  . sertraline  25 mg Oral Daily  .  sodium chloride flush  3 mL Intravenous Q12H  . vitamin B-12  100 mcg Oral Daily   Continuous Infusions:   LOS: 4 days    Time spent: 35 minutes.     Alba CoryBelkys A Zeynab Klett, MD [facility] Pager [phone]  If 7PM-7AM, please contact night-coverage [email or web address] Password [email or web address] 01/16/2018, 9:32 AM

## 2018-01-16 NOTE — Progress Notes (Signed)
Dr. Jacinto HalimGanji at bedside updating daughter per request.

## 2018-01-16 NOTE — Progress Notes (Signed)
Occupational Therapy Treatment Patient Details Name: Dan Stone MRN: [id number] DOB: 1933/09/30 Today's Date: 01/16/2018    History of present illness 84 year old [place], dementia, stroke, atrial fibrillation presented with history of syncope outside, while wearing warm clothing. Reportedly had systolic blood pressures in the 60s per EMS. Patient confused and vomited after syncopal episode. Recently treated for pneumonia and [name], still on Levaquin.   OT comments  Patient progressing slowly. With encouragement agreeable to participate in session. Requires modA +2 for bed mobility and transfers, for safety and to ascend into standing.  Continues to be limited by cognition, reporting he is in an office in [place].  VSS throughout session.  BP: supine 110/69, EOB 116/60, standing 110/68, standing x 3 min 126/84. Will continue to follow while admitted, will need 24/7 care at dc.    Follow Up Recommendations  SNF;Supervision/Assistance - 24 hour    Equipment Recommendations  Other (comment)(TBD at next venue of  care)    Recommendations for Other Services      Precautions / Restrictions Precautions Precautions: Fall Precaution Comments: history of dementia Restrictions Weight Bearing Restrictions: No       Mobility Bed Mobility Overal bed mobility: Needs Assistance Bed Mobility: Supine to Sit     Supine to sit: Mod assist;+2 for physical assistance     General bed mobility comments: Patient requires assistance for trunk support and management of L LE to EOB  Transfers Overall transfer level: Needs assistance Equipment used: Rolling walker (2 wheeled) Transfers: Sit to/from Stand Sit to Stand: Mod assist;+2 physical assistance         General transfer comment: modA for sit to stand given increased time, cueing for hand placement and sequencing     Balance Overall balance assessment: Needs  assistance Sitting-balance support: Bilateral upper extremity supported;Feet supported Sitting balance-Leahy Scale: Fair Sitting balance - Comments: maintained static sitting balance EOB with SPV   Standing balance support: Bilateral upper extremity supported;During functional activity Standing balance-Leahy Scale: Poor Standing balance comment: relaint on B UE support                            ADL either performed or assessed with clinical judgement   ADL Overall ADL's : Needs assistance/impaired                         Toilet Transfer: Moderate assistance;RW;+2 for physical assistance(simulated to recliner ) Toilet Transfer Details (indicate cue type and reason): cueing for hand placement and safety, +2 for physical assist to ascend into standing         Functional mobility during ADLs: Moderate assistance;+2 for physical assistance;Rolling walker General ADL Comments: increased time and effort, patient reporting "I'm lazy";  completed mobility and transfers      Vision       Perception     Praxis      Cognition Arousal/Alertness: Awake/alert Behavior During Therapy: WFL for tasks assessed/performed Overall Cognitive Status: No family/caregiver present to determine baseline cognitive functioning Area of Impairment: Orientation;Attention;Memory;Following commands;Safety/judgement;Awareness;Problem solving                 Orientation Level: Disoriented to;Place;Time;Situation Current Attention Level: Sustained Memory: Decreased recall of precautions;Decreased short-term memory Following Commands: Follows one step commands inconsistently;Follows one step commands with increased time Safety/Judgement: Decreased awareness of safety;Decreased awareness of deficits Awareness: Intellectual Problem Solving: Slow processing;Decreased initiation;Difficulty  sequencing;Requires verbal cues;Requires tactile cues General Comments: unsure of baseline, no  family available        Exercises     Shoulder Instructions       General Comments      Pertinent Vitals/ Pain       Pain Assessment: No/denies pain  Home Living                                          Prior Functioning/Environment              Frequency  Min 2X/week        Progress Toward Goals  OT Goals(current goals can now be found in the care plan section)  Progress towards OT goals: Progressing toward goals  Acute Rehab OT Goals Patient Stated Goal: Pt did not state but agreeable to work with therapy. OT Goal Formulation: With patient Time For Goal Achievement: 01/28/18 Potential to Achieve Goals: Good  Plan Discharge plan remains appropriate;Frequency remains appropriate    Co-evaluation                 AM-PAC PT "6 Clicks" Daily Activity     Outcome Measure   Help from another person eating meals?: A Little Help from another person taking care of personal grooming?: A Little Help from another person toileting, which includes using toliet, bedpan, or urinal?: A Lot Help from another person bathing (including washing, rinsing, drying)?: A Lot Help from another person to put on and taking off regular upper body clothing?: A Little Help from another person to put on and taking off regular lower body clothing?: A Lot 6 Click Score: 15    End of Session Equipment Utilized During Treatment: Rolling walker;Oxygen  OT Visit Diagnosis: Unsteadiness on feet (R26.81);Muscle weakness (generalized) (M62.81)   Activity Tolerance Patient tolerated treatment well   Patient Left in bed;in chair;with call bell/phone within reach;with chair alarm set;with nursing/sitter in room   Nurse Communication Mobility status;Precautions(BP)        Time: 1111-1140 OT Time Calculation (min): 29 min  Charges: OT General Charges $OT Visit: 1 Visit OT Treatments $Self Care/Home Management : 23-37 mins  Dan Stone, OTR/L  Pager  [phone]    Dan Stone 01/16/2018, 1:02 PM

## 2018-01-17 LAB — GLUCOSE, CAPILLARY
GLUCOSE-CAPILLARY: 80 mg/dL (ref 70–99)
GLUCOSE-CAPILLARY: 96 mg/dL (ref 70–99)
GLUCOSE-CAPILLARY: 94 mg/dL (ref 70–99)
GLUCOSE-CAPILLARY: 116 mg/dL — AB (ref 70–99)
Glucose-Capillary: 100 mg/dL — ABNORMAL HIGH (ref 70–99)
Glucose-Capillary: 79 mg/dL (ref 70–99)
Glucose-Capillary: 99 mg/dL (ref 70–99)
Glucose-Capillary: <10 mg/dL — CL (ref 70–99)

## 2018-01-17 LAB — C-PEPTIDE: C PEPTIDE: 10.4 ng/mL — AB (ref 1.1–4.4)

## 2018-01-17 LAB — CBC
HEMATOCRIT: 35.1 % — AB (ref 39.0–52.0)
Hemoglobin: 11.2 g/dL — ABNORMAL LOW (ref 13.0–17.0)
MCH: 28.2 pg (ref 26.0–34.0)
MCHC: 31.9 g/dL (ref 30.0–36.0)
MCV: 88.4 fL (ref 78.0–100.0)
Platelets: 164 10*3/uL (ref 150–400)
RBC: 3.97 MIL/uL — ABNORMAL LOW (ref 4.22–5.81)
RDW: 13.5 % (ref 11.5–15.5)
WBC: 5.4 10*3/uL (ref 4.0–10.5)

## 2018-01-17 LAB — BASIC METABOLIC PANEL
Anion gap: 5 (ref 5–15)
BUN: 9 mg/dL (ref 8–23)
CO2: 26 mmol/L (ref 22–32)
CREATININE: 0.81 mg/dL (ref 0.61–1.24)
Calcium: 8 mg/dL — ABNORMAL LOW (ref 8.9–10.3)
Chloride: 109 mmol/L (ref 98–111)
GFR calc Af Amer: >60 mL/min (ref >60)
GFR calc non Af Amer: >60 mL/min (ref >60)
GLUCOSE: 107 mg/dL — AB (ref 70–99)
Potassium: 3.2 mmol/L — ABNORMAL LOW (ref 3.5–5.1)
Sodium: 140 mmol/L (ref 135–145)

## 2018-01-17 LAB — MAGNESIUM: Magnesium: 1.9 mg/dL (ref 1.7–2.4)

## 2018-01-17 LAB — RPR: RPR Ser Ql: NONREACTIVE

## 2018-01-17 LAB — INSULIN, RANDOM: INSULIN: 173 u[IU]/mL — AB (ref 2.6–24.9)

## 2018-01-17 MED ORDER — COSYNTROPIN 0.25 MG IJ SOLR
0.2500 mg | Freq: Once | INTRAMUSCULAR | Status: AC
  Administered 2018-01-18: 0.25 mg via INTRAVENOUS
  Filled 2018-01-17: qty 0.25

## 2018-01-17 MED ORDER — POTASSIUM CHLORIDE CRYS ER 20 MEQ PO TBCR
40.0000 meq | EXTENDED_RELEASE_TABLET | Freq: Once | ORAL | Status: AC
  Administered 2018-01-17: 40 meq via ORAL
  Filled 2018-01-17: qty 2

## 2018-01-17 MED ORDER — MIDODRINE HCL 5 MG PO TABS
10.0000 mg | ORAL_TABLET | Freq: Three times a day (TID) | ORAL | Status: DC
  Administered 2018-01-18 (×2): 10 mg via ORAL
  Filled 2018-01-17 (×3): qty 2

## 2018-01-17 NOTE — Telephone Encounter (Signed)
Spoke with Temeka with [facility] regarding verbal order request for PT and skilled nursing for pt, she is aware that pt has been admitted to the hospital, states that she will call when pt is discharged if still needs orders

## 2018-01-17 NOTE — Progress Notes (Signed)
No new recommendation, I have added Midodrine to be used PRN. Will signoff Yates DecampJay Brittney Mucha

## 2018-01-17 NOTE — Consult Note (Signed)
Reason for Consult: Low glucose, Syncope  Referring Physician: Dr. Asencion Islamegalado  Dan Stone is an 84 y.o. male.  History of Present Illness: Dan Stone reports episodes of syncope--especially for the past three months, but also in the past.  In regards to possible precipitating or contributing factors, he recalls multiple courses of prednisone or other oral corticosteroids in the past.  No intentional use of oral hypoglycemic medicines or insulin.  He does not recall any diagnosis of diabetes mellitus.   Pertinent review of systems include recent weight loss.    Past Medical History:  Diagnosis Date  . A-fib (HCC)   . AS (aortic stenosis)   . Blind right eye   . Bradycardia   . Chicken pox   . Dementia   . Frequent urinary tract infections   . Glaucoma   . Hypertension   . PNA (pneumonia)   . Stroke [facility](HCC)    5 years ago  . Syncope     Past Surgical History:  Procedure Laterality Date  . EYE SURGERY      Family History  Problem Relation Age of Onset  . CAD Father     Social History:  reports that he has never smoked. He has never used smokeless tobacco. He reports that he does not drink alcohol or use drugs.  Allergies:  Allergies  Allergen Reactions  . Xarelto [Rivaroxaban] Other (See Comments)    Per patients family, there is currently a lawsuit against xarelto in pt's name. Bleeding in the bowels    Medications: I have reviewed the patient's current medications.  ROS  General: No weight gain. Endocrine:  Polyuria, nocturia twice a night Cardiovascular: No chest pain. Respiratory: No dyspnea, no cough. Gastrointestinal: No vomiting, no diarrhea. Psychiatric: Anxiety, no depression. Skin: No diffuse hyperpigmentation. Genitourinary:  No hematuria. Hematologic:  No abnormal bleeding or bruising. Neurologic:  No tremor.  Blood pressure 139/85, pulse 86, temperature 98 F (36.7 C), temperature source Oral, resp. rate 18, height 5\' 8"  (1.727 m), weight  93.4 kg, SpO2 96 %. Physical Exam General: No apparent distress. Eyes: Anicteric, no lid lig. Neck: Supple, trachea midline. Thyroid: No enlargement, mobile without fixation, no tenderness, but partially substernal which limits thyroid exam. Cardiovascular: Irregular rhythm, murmur, full radial pulse in right wrist, no visible cyanosis.  Respiratory: Normal respiratory effort, coarse breath sounds. Gastrointestinal: Normal pitch active bowel sounds, nontender abdomen without distention or appreciable hepatomegaly. Neurologic: Cranial nerves normal as tested, deep tendon reflexes 1+ and symmetric. Musculoskeletal: Normal muscle tone, no muscle atrophy. Skin: Appropriate warmth, appropriately dry, no visible rash. Mental status: Conversant, speech slow but relatively clear thought logical,  no hallucinations or delusions evident. Hematologic/lymphatic: No cervical or supraclavicular adenopathy, no jaundice.  Lab Results  Component Value Date   GLUCOSE 107 (H) 01/17/2018   GLUCOSE 138 (H) 01/16/2018   GLUCOSE 183 (H) 01/15/2018   GLUCOSE 77 01/13/2018   GLUCOSE 100 (H) 01/12/2018   GLUCOSE 93 01/11/2018   GLUCOSE 91 12/09/2017    Lab Results  Component Value Date   TSH 1.341 01/15/2018    Assessment/Plan: 1.  Syncope 2.  History of corticosteroid therapy 3.  Abnormal lab result (undetectable glucose on one fingerstick test)  Today I spoke with the requesting physician, reviewed lab reports, reviewed outside outpatient records, reviewed CT of head report, and reviewed MRI of head images and report.   Summary of records:  No apparent physical abnormality in pituitary gland.  Plasma or serum glucose levels unequivocally normal (>  70 mg/dL).  Normal to high-normal hemoglobin A1c levels (recently 5.6% to 6.1%).    For several reasons, I suspect that the fingerstick glucose level <10 was a misleading result.    The random cortisol level of 10 is somewhat reassuring, but does not  exclude the possibility of mild adrenal insufficiency.    Recommendations: 1.  Cosyntropin stimulation test (as already ordered by Dr. Sunnie Nielsenegalado). 2.  If another fingerstick glucose level is reported to be lower than 60 mg/dL, then immediately obtain a plasma (or serum) glucose level along with concurrent insulin and C-peptide measurements before caloric intake.    If his Cosyntropin stimulation test shows a normal response and plasma (or serum) glucose levels remain above 60 mg/dL, then I recommend an office visit with me for followup after his hospital stay.    Bobbi Kozakiewicz 01/17/2018, 6:49 PM

## 2018-01-17 NOTE — NC FL2 (Signed)
[place] MEDICAID FL2 LEVEL OF CARE SCREENING TOOL     IDENTIFICATION  Patient Name: Dan Stone Birthdate: [birth year] Sex: male Admission Date (Current Location): 01/11/2018  [facility] [place]   Facility and Address:  [facility], [address], [place], [place] [address]      Provider Number: [id number]  Attending Physician Name and Address:  Alba Coryegalado, Belkys A, MD  Relative Name and Phone Number:       Current Level of Care: Hospital Recommended Level of Care: Skilled Nursing Facility Prior Approval Number:    Date Approved/Denied:   PASRR Number: Having issues logging into [place] Must. Will try again later.  Discharge Plan: SNF    Current Diagnoses: Patient Active Problem List   Diagnosis Date Noted  . Wandering atrial pacemaker 01/12/2018  . Aortic stenosis, moderate 01/12/2018  . Orthostatic hypotension 01/12/2018  . Syncope 01/12/2018  . Hypotension 01/11/2018  . History of CVA with residual deficit   . Nonrheumatic aortic valve stenosis   . Retinal artery occlusion   . Syncope and collapse 12/05/2017  . Essential hypertension 12/05/2017  . Dementia 12/05/2017    Orientation RESPIRATION BLADDER Height & Weight     Self  Normal Incontinent, External catheter Weight: 205 lb 14.6 oz (93.4 kg) Height:  5\' 8"  (172.7 cm)  BEHAVIORAL SYMPTOMS/MOOD NEUROLOGICAL BOWEL NUTRITION STATUS  (None) (Dementia) Incontinent Diet(Regular)  AMBULATORY STATUS COMMUNICATION OF NEEDS Skin   Limited Assist Verbally Bruising                       Personal Care Assistance Level of Assistance  Bathing, Feeding, Dressing Bathing Assistance: Limited assistance Feeding assistance: Limited assistance Dressing Assistance: Limited assistance     Functional Limitations Info  Sight, Hearing, Speech Sight Info: Adequate Hearing Info: Adequate Speech Info: Adequate    SPECIAL CARE FACTORS FREQUENCY  PT (By licensed  PT), Restraints, Blood pressure, OT (By licensed OT)     PT Frequency: 5 x week OT Frequency: 5 x week       Restraints Frequency: [place]: Pulled IV three times. Dementia. No negative behaviors. Will have MD discontinue at least 24 hours prior to discharge.    Contractures Contractures Info: Not present    Additional Factors Info  Code Status, Allergies Code Status Info: Full code Allergies Info: Xarelto (Rivaroxaban).           Current Medications (01/17/2018):  This is the current hospital active medication list Current Facility-Administered Medications  Medication Dose Route Frequency Provider Last Rate Last Dose  . 0.9 %  sodium chloride infusion   Intravenous Continuous Regalado, Belkys A, MD 50 mL/hr at 01/16/18 1413    . acetaminophen (TYLENOL) tablet 650 mg  650 mg Oral Q6H PRN Johnson-Pitts, Endia, MD       Or  . acetaminophen (TYLENOL) suppository 650 mg  650 mg Rectal Q6H PRN Johnson-Pitts, Endia, MD      . alum & mag hydroxide-simeth (MAALOX/MYLANTA) 200-200-20 MG/5ML suspension 30 mL  30 mL Oral Q6H PRN Johnson-Pitts, Endia, MD      . Ampicillin-Sulbactam (UNASYN) 3 g in sodium chloride 0.9 % 100 mL IVPB  3 g Intravenous Q8H Johnston, Nita F, RPH 200 mL/hr at 01/17/18 1058 3 g at 01/17/18 1058  . atorvastatin (LIPITOR) tablet 40 mg  40 mg Oral QHS Johnson-Pitts, Endia, MD   40 mg at 01/16/18 2207  . bisacodyl (DULCOLAX) suppository 10 mg  10  mg Rectal Daily PRN Johnson-Pitts, Endia, MD      . brimonidine (ALPHAGAN) 0.2 % ophthalmic solution 1 drop  1 drop Both Eyes BID Johnson-Pitts, Endia, MD   1 drop at 01/17/18 0831  . clopidogrel (PLAVIX) tablet 75 mg  75 mg Oral Daily Johnson-Pitts, Endia, MD   75 mg at 01/17/18 0830  . [START ON 01/18/2018] cosyntropin (CORTROSYN) injection 0.25 mg  0.25 mg Intravenous Once Regalado, Belkys A, MD      . dextrose 50 % solution 50 mL  1 ampule Intravenous Once Regalado, Belkys A, MD      . enoxaparin (LOVENOX) injection 40 mg  40  mg Subcutaneous Q24H Johnson-Pitts, Endia, MD   40 mg at 01/16/18 1951  . feeding supplement (ENSURE ENLIVE) (ENSURE ENLIVE) liquid 237 mL  237 mL Oral TID BM Regalado, Belkys A, MD   237 mL at 01/17/18 0832  . gabapentin (NEURONTIN) capsule 100 mg  100 mg Oral QHS Regalado, Belkys A, MD      . levETIRAcetam (KEPPRA) tablet 500 mg  500 mg Oral BID Regalado, Belkys A, MD   500 mg at 01/17/18 0829  . midodrine (PROAMATINE) tablet 10 mg  10 mg Oral TID WC Yates DecampGanji, Jay, MD      . senna-docusate (Senokot-S) tablet 1 tablet  1 tablet Oral QHS PRN Johnson-Pitts, Endia, MD      . sertraline (ZOLOFT) tablet 25 mg  25 mg Oral Daily Regalado, Belkys A, MD   25 mg at 01/17/18 0829  . sodium chloride flush (NS) 0.9 % injection 3 mL  3 mL Intravenous Q12H Johnson-Pitts, Endia, MD   3 mL at 01/17/18 0832  . vitamin B-12 (CYANOCOBALAMIN) tablet 100 mcg  100 mcg Oral Daily Regalado, Belkys A, MD   100 mcg at 01/17/18 0830     Discharge Medications: Please see discharge summary for a list of discharge medications.  Relevant Imaging Results:  Relevant Lab Results:   Additional Information SS#: [id number]. [facility].  Margarito LinerSarah C Jupiter Kabir, LCSW

## 2018-01-17 NOTE — Progress Notes (Signed)
PROGRESS NOTE    Dan FavorJames Arthur Lara Stone  [id number] DOB: Oct 17, 1933 DOA: 01/11/2018 PCP: Deeann SaintBanks, Shannon R, MD   Brief Narrative: Brief Narrative: 84 year [place], dementia, stroke, atrial fibrillation presented with history of syncope outside, while wearing warm clothing.  Reportedly had systolic blood pressures in the 60s per EMS.  Patient confused and vomited after syncopal episode.  Recently treated for pneumonia and [name], and was still on Levaquin. Chest x ray on admission was negative for PNA.   Patient had unresponsive episode in the hospital on 8-11, he regain consciousness. He had an EEG done that was negative for seizure. His syncope episode was thought to be related to orthostatic hypotension. For this reason his BP medications were discontinue. He was notice to have 2;1 AV block. Cardiology does not recommend pacemaker.   Patient on 8-14, was more sleepy than usual, kept eyes close but was answering questions. I got blood gas, which was negative for hypercapnia, did showed hypoxemia. He was place on oxygen. Ammonia level normal, B 12 low normal. B 12 supplements started. RPR pending. Neurology has been consulted.   Second opinion from cardiology requested by patient's daughter. Dr Nadara EatonGangi consulted. He agrees that syncope likely related to orthostatic hypotension. patient will need Holter monitor. Q shift of orthostatic vitals.    Assessment & Plan:   Principal Problem:   Syncope and collapse Active Problems:   Essential hypertension   Dementia   Nonrheumatic aortic valve stenosis   Hypotension   Wandering atrial pacemaker   Aortic stenosis, moderate   Orthostatic hypotension   Syncope  1-Syncope;  Evaluated by cardiology, syncope is thought to be related to orthostatic hypotension. Stop trazodone.  Hold BP medications.  Follow orthostatic vitals Q shift.  Resume low rate IV fluids.  Zoloft decreased to  once a day.  Second opinion for cardiology per family request. Dr Nadara Eatongangi recommend to start midodrine.   Wandering Pacemaker MAT and 2;1 AV block ; Dr Nadara EatonGangi recommends Holter monitor.   Hypoglycemia;  Cortisol level normal. Discussed with Dr Sharl MaKerr. We should still rule out adrenal insufficiency . Will order cosyntropin test  sulfonyl urea, insulin level, ce peptide. Insulin and C peptide probably not accurate by the time labs was drawn blood sugar was higher. Dr Sharl MaKerr agree to see patient in consultation. I appreciate his help./  Check cbg Q 4 hours.  Snack at bedtime.  TSH normal.   Acute encephalopathy;  Patient was more sleepy the morning of 8-14. Mentation fluctuates.  He was more alert this afternoon on my second round.  EEG done on admission, was negative for seizure like activities.  B 12 low normal. Started  supplement.  RPR ordered.  TSH normal. Ammonia negative.  Old left side weakness.  ABG hypoxemia. placed on 2 L oxygen.  Neurology consulted.  Started on Keppra due to concern for prior seizure like activities.   PNA; per daughter patient has been coughing. She is worry that patient didn't finished antibiotics.  -repeated chest x ray; showed atelectasis vs PNA/  -start empirically antibiotics due to cough, and encephalopathy.   History of CVA;  Continue with plavix and statin.   Moderate to severe aortic stenosis.  Cardiology recommended conservative management with repeat echo 6 months.   Orthostatic hypotension;  Related to dehydration.  Hold BP medications.  Resume IV fluids.  Started on midodrine by dr Nadara Eatongangi. Nurse called and clarify order with Dr Nadara Eatongangi.   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     DVT prophylaxis: Lovenox Code Status: full code.  Family Communication; daughter updated  Disposition Plan: SNF  tomorrow or Sunday, pending on cosyntropin test   Consultants:   Cardiology   Procedures: EEG;    Antimicrobials: none   Subjective: He is alert, conversant. Denies pain,   Objective: Vitals:   01/16/18 1250 01/16/18 1959 01/17/18 0401 01/17/18 1220  BP: (!) 137/125 110/60 (!) 159/86 139/85  Pulse: 66 60 75 86  Resp:  20  18  Temp: 99.1 F (37.3 C) 98.5 F (36.9 C)  98 F (36.7 C)  TempSrc: Oral Oral  Oral  SpO2: 100% 97% 98% 96%  Weight:   93.4 kg   Height:        Intake/Output Summary (Last 24 hours) at 01/17/2018 1652 Last data filed at 01/17/2018 1421 Gross per 24 hour  Intake 777 ml  Output 800 ml  Net -23 ml   Filed Weights   01/15/18 0700 01/16/18 0336 01/17/18 0401  Weight: 97.2 kg 94 kg 93.4 kg    Examination:  General exam: NAD Respiratory system: CTA Cardiovascular system:  S 1, S 2 RRR Gastrointestinal system: BS present, soft, nt Central nervous system: alert, left side weakness, chronic Extremities: no edema Skin: no rashes.     Data Reviewed: I have personally reviewed following labs and imaging studies  CBC: Recent Labs  Lab 01/11/18 1603 01/15/18 0948 01/16/18 0959 01/17/18 0452  WBC 4.2 4.5 5.1 5.4  HGB 11.5* 11.1* 12.3* 11.2*  HCT 36.3* 35.5* 38.5* 35.1*  MCV 88.8 88.5 88.5 88.4  PLT 152 160 173 164   Basic Metabolic Panel: Recent Labs  Lab 01/12/18 0459 01/13/18 0402 01/15/18 0948 01/16/18 0911 01/17/18 0452  NA 141 141 140 139 140  K 3.8 3.8 3.4* 3.8 3.2*  CL 103 108 108 106 109  CO2 28 27 27 25 26   GLUCOSE 100* 77 183* 138* 107*  BUN 6* 7* 7* 7* 9  CREATININE 1.00 0.95 0.91 0.88 0.81  CALCIUM 8.6* 8.0* 8.2* 8.7* 8.0*  MG  --   --   --   --  1.9   GFR: Estimated Creatinine Clearance: 79.3 mL/min (by C-G formula based on SCr of 0.81 mg/dL). Liver Function Tests: No results for input(s): AST, ALT, ALKPHOS, BILITOT, PROT, ALBUMIN in the last 168 hours. No results for input(s): LIPASE, AMYLASE in the last 168  hours. Recent Labs  Lab 01/15/18 0948  AMMONIA 32   Coagulation Profile: No results for input(s): INR, PROTIME in the last 168 hours. Cardiac Enzymes: No results for input(s): CKTOTAL, CKMB, CKMBINDEX, TROPONINI in the last 168 hours. BNP (last 3 results) No results for input(s): PROBNP in the last 8760 hours. HbA1C: No results for input(s): HGBA1C in the last 72 hours. CBG: Recent Labs  Lab 01/17/18 0357 01/17/18 0759 01/17/18 0819 01/17/18 1225 01/17/18 1631  GLUCAP 80 <10* 79 96 94   Lipid Profile: No results for input(s): CHOL, HDL, LDLCALC, TRIG, CHOLHDL, LDLDIRECT in the last 72 hours. Thyroid Function Tests: Recent Labs    01/15/18 0948  TSH 1.341   Anemia Panel: Recent Labs    01/15/18 0948  VITAMINB12 195   Sepsis Labs: No results for input(s): PROCALCITON, LATICACIDVEN in the last 168 hours.  No results found for this or any previous visit (from the past 240 hour(s)).  Radiology Studies: Dg Chest 2 View  Result Date: 01/16/2018 CLINICAL DATA:  Patient diagnosed with atrial fibrillation 01/08/2018. Bradycardia and syncope. EXAM: CHEST - 2 VIEW COMPARISON:  Single-view of the chest 01/11/2018. PA and lateral chest 12/04/2017. FINDINGS: The left hemidiaphragm is obscured consistent with left basilar airspace disease. The right lung is clear. No pneumothorax or pleural effusion. Heart size is mildly enlarged. Aortic atherosclerosis is noted. Degenerative disease about the right shoulder in thoracic spine. No acute or focal bony abnormality. IMPRESSION: Left basilar airspace disease which could be atelectasis or pneumonia. Electronically Signed   By: Drusilla Kanner M.D.   On: 01/16/2018 10:46        Scheduled Meds: . atorvastatin  40 mg Oral QHS  . brimonidine  1 drop Both Eyes BID  . clopidogrel  75 mg Oral Daily  . [START ON 01/18/2018] cosyntropin  0.25 mg Intravenous Once  . dextrose  1 ampule Intravenous Once  . enoxaparin (LOVENOX)  injection  40 mg Subcutaneous Q24H  . feeding supplement (ENSURE ENLIVE)  237 mL Oral TID BM  . gabapentin  100 mg Oral QHS  . levETIRAcetam  500 mg Oral BID  . midodrine  10 mg Oral TID WC  . sertraline  25 mg Oral Daily  . sodium chloride flush  3 mL Intravenous Q12H  . vitamin B-12  100 mcg Oral Daily   Continuous Infusions: . sodium chloride 50 mL/hr at 01/16/18 1413  . ampicillin-sulbactam (UNASYN) IV 3 g (01/17/18 1058)     LOS: 5 days    Time spent: 35 minutes.     Alba Cory, MD [facility] Pager [phone]  If 7PM-7AM, please contact night-coverage [email or web address] Password [email or web address] 01/17/2018, 4:52 PM

## 2018-01-17 NOTE — Clinical Social Work Placement (Signed)
   CLINICAL SOCIAL WORK PLACEMENT  NOTE  Date:  01/17/2018  Patient Details  Name: Dan Stone MRN: [id number] Date of Birth: 1933-10-25  Clinical Social Work is seeking post-discharge placement for this patient at the Skilled  Nursing Facility level of care (*CSW will initial, date and re-position this form in  chart as items are completed):      Patient/family provided with [facility] Clinical Social Work Department's list of facilities offering this level of care within the geographic area requested by the patient (or if unable, by the patient's family).      Patient/family informed of their freedom to choose among providers that offer the needed level of care, that participate in Medicare, Medicaid or managed care program needed by the patient, have an available bed and are willing to accept the patient.      Patient/family informed of [place]'s ownership interest in [facility] and [facility], as well as of the fact that they are under no obligation to receive care at these facilities.  PASRR submitted to EDS on 01/17/18     PASRR number received on       Existing PASRR number confirmed on 01/17/18     FL2 transmitted to all facilities in geographic area requested by pt/family on 01/17/18     FL2 transmitted to all facilities within larger geographic area on       Patient informed that his/her managed care company has contracts with or will negotiate with certain facilities, including the following:            Patient/family informed of bed offers received.  Patient chooses bed at       Physician recommends and patient chooses bed at      Patient to be transferred to   on  .  Patient to be transferred to facility by       Patient family notified on   of transfer.  Name of family member notified:        PHYSICIAN Please sign FL2     Additional Comment:    _______________________________________________ Margarito LinerSarah C Malaia Buchta, LCSW 01/17/2018,  12:50 PM

## 2018-01-17 NOTE — Clinical Social Work Note (Signed)
Clinical Social Work Assessment  Patient Details  Name: Dan Stone MRN: [id number] Date of Birth: February 10, 1934  Date of referral:  01/17/18               Reason for consult:  Facility Placement, Discharge Planning                Permission sought to share information with:  Facility Medical sales representativeContact Representative, Family Supports Permission granted to share information::     Name::     Dan Stone  Agency::  [facility]  Relationship::  Daughter  Contact Information:  [phone]  Housing/Transportation Living arrangements for the past 2 months:  Single Family Home Source of Information:  Medical Team, Adult Children Patient Interpreter Needed:  None Criminal Activity/Legal Involvement Pertinent to Current Situation/Hospitalization:  No - Comment as needed Significant Relationships:  Adult Children Lives with:  Adult Children Do you feel safe going back to the place where you live?  Yes Need for family participation in patient care:  Yes (Comment)  Care giving concerns:  PT recommending SNF once medically stable for discharge.   Social Worker assessment / plan:  Patient not fully oriented. No supports at bedside. CSW called patient's daughter, introduced role, and explained that PT recommendations would be discussed. Patient's daughter is agreeable to SNF placement. [facility] preference is [facility]. Patient was there 7/8-12. Admissions coordinator stated they can take him when ready and that daughter had already called her earlier this week. Patient currently has mittens on due to pulling IV. Patient will need to be without mittens for 24 hours prior to discharge. Patient's daughter stated [facility] and she is hoping she can get someone to stay with him for a short period of time after he discharges home from SNF. CSW explained this will likely be an out of pocket cost but SNF social worker can work with her to figure out services needed. No further  concerns. CSW encouraged patient's daughter to contact CSW as needed. CSW will continue to follow patient and his daughter for support and facilitate discharge to SNF once medically stable. Daughter stated she was told he would discharge Sunday.  Employment status:  Retired Health and safety inspectornsurance information:  Medicare PT Recommendations:  Skilled Nursing Facility Information / Referral to community resources:  Skilled Nursing Facility  Patient/Family's Response to care:  Patient not fully oriented. Patient's daughter agreeable to SNF placement. Patient's daughter supportive and involved in patient's care. Patient's daughter appreciated social work intervention.  Patient/Family's Understanding of and Emotional Response to Diagnosis, Current Treatment, and Prognosis:  Patient not fully oriented. Patient's daughter has a good understanding of the reason for admission and his need for rehab prior to returning home. Patient's daughter appears happy with hospital care.  Emotional Assessment Appearance:  Appears stated age Attitude/Demeanor/Rapport:  Unable to Assess Affect (typically observed):  Unable to Assess Orientation:  Oriented to Self Alcohol / Substance use:  Never Used Psych involvement (Current and /or in the community):  No (Comment)  Discharge Needs  Concerns to be addressed:  Care Coordination Readmission within the last 30 days:  No Current discharge risk:  Dependent with Mobility Barriers to Discharge:  Continued Medical Work up, Requiring sitter/restraints   Dan LinerSarah C Asal Teas, LCSW 01/17/2018, 12:42 PM

## 2018-01-18 LAB — ACTH STIMULATION, 3 TIME POINTS
CORTISOL 60 MIN: 21.1 ug/dL
Cortisol, 30 Min: 20.3 ug/dL
Cortisol, Base: 5.5 ug/dL

## 2018-01-18 LAB — BASIC METABOLIC PANEL
ANION GAP: 8 (ref 5–15)
Anion gap: 4 — ABNORMAL LOW (ref 5–15)
BUN: 9 mg/dL (ref 8–23)
BUN: 7 mg/dL — ABNORMAL LOW (ref 8–23)
CHLORIDE: 106 mmol/L (ref 98–111)
CO2: 26 mmol/L (ref 22–32)
CO2: 25 mmol/L (ref 22–32)
CREATININE: 0.85 mg/dL (ref 0.61–1.24)
Calcium: 8 mg/dL — ABNORMAL LOW (ref 8.9–10.3)
Calcium: 8.4 mg/dL — ABNORMAL LOW (ref 8.9–10.3)
Chloride: 109 mmol/L (ref 98–111)
Creatinine, Ser: 0.78 mg/dL (ref 0.61–1.24)
GFR calc Af Amer: >60 mL/min (ref >60)
GFR calc Af Amer: >60 mL/min (ref >60)
GFR calc non Af Amer: >60 mL/min (ref >60)
GLUCOSE: 74 mg/dL (ref 70–99)
GLUCOSE: 119 mg/dL — AB (ref 70–99)
Potassium: 3.8 mmol/L (ref 3.5–5.1)
Potassium: 3.7 mmol/L (ref 3.5–5.1)
SODIUM: 139 mmol/L (ref 135–145)
SODIUM: 139 mmol/L (ref 135–145)

## 2018-01-18 LAB — GLUCOSE, CAPILLARY
GLUCOSE-CAPILLARY: 101 mg/dL — AB (ref 70–99)
GLUCOSE-CAPILLARY: 80 mg/dL (ref 70–99)
GLUCOSE-CAPILLARY: 85 mg/dL (ref 70–99)
Glucose-Capillary: 59 mg/dL — ABNORMAL LOW (ref 70–99)
Glucose-Capillary: 111 mg/dL — ABNORMAL HIGH (ref 70–99)
Glucose-Capillary: 119 mg/dL — ABNORMAL HIGH (ref 70–99)
Glucose-Capillary: 149 mg/dL — ABNORMAL HIGH (ref 70–99)

## 2018-01-18 LAB — MAGNESIUM: Magnesium: 2 mg/dL (ref 1.7–2.4)

## 2018-01-18 LAB — CREATININE, SERUM
Creatinine, Ser: 0.84 mg/dL (ref 0.61–1.24)
GFR calc Af Amer: >60 mL/min (ref >60)
GFR calc non Af Amer: >60 mL/min (ref >60)

## 2018-01-18 LAB — GLUCOSE, RANDOM: GLUCOSE: 79 mg/dL (ref 70–99)

## 2018-01-18 MED ORDER — POTASSIUM CHLORIDE CRYS ER 20 MEQ PO TBCR
40.0000 meq | EXTENDED_RELEASE_TABLET | Freq: Once | ORAL | Status: AC
  Administered 2018-01-18: 40 meq via ORAL
  Filled 2018-01-18: qty 2

## 2018-01-18 NOTE — Progress Notes (Signed)
Pts HR went down to 32 bpm.Patient asleep.VS stable and documented in Epic.MD Regalado notified. Will continue to monitor.   Lior Hoen, RN

## 2018-01-18 NOTE — Progress Notes (Signed)
EKG ordered in progress.  Maeghan Canny, RN

## 2018-01-18 NOTE — Progress Notes (Signed)
Patient more alert and speaking full sentences.  Blood sugar remains stable during the day. VS stable, per MD keep [place] and O2 2L even if oxygen is stable. HR in 40-50's.  Patient voided 300 ml.  Denies pain 0/10.  Will continue to monitor.  Cherilynn Schomburg, RN

## 2018-01-18 NOTE — Progress Notes (Addendum)
PROGRESS NOTE    Dan Stone  ZOX:[id number] DOB: Sep 18, 1933 DOA: 01/11/2018 PCP: Deeann SaintBanks, Shannon R, MD   Brief Narrative: Brief Narrative: 84 year [place], dementia, stroke, atrial fibrillation presented with history of syncope outside, while wearing warm clothing.  Reportedly had systolic blood pressures in the 60s per EMS.  Patient confused and vomited after syncopal episode.  Recently treated for pneumonia and [name], and was still on Levaquin. Chest x ray on admission was negative for PNA.   Patient had unresponsive episode in the hospital on 8-11, he regain consciousness. He had an EEG done that was negative for seizure. His syncope episode was thought to be related to orthostatic hypotension. For this reason his BP medications were discontinue. He was notice to have 2;1 AV block. Cardiology does not recommend pacemaker.   Patient on 8-14, was more sleepy than usual, kept eyes close but was answering questions. I got blood gas, which was negative for hypercapnia, did showed hypoxemia. He was place on oxygen. Ammonia level normal, B 12 low normal. B 12 supplements started. RPR pending. Neurology has been consulted.   Second opinion from cardiology requested by patient's daughter. Dr Jacinto HalimGanji consulted. He agrees that syncope likely related to orthostatic hypotension. patient will need Holter monitor. Q shift of orthostatic vitals.    Assessment & Plan:   Principal Problem:   Syncope and collapse Active Problems:   Essential hypertension   Dementia   Nonrheumatic aortic valve stenosis   Hypotension   Wandering atrial pacemaker   Aortic stenosis, moderate   Orthostatic hypotension   Syncope  1-Syncope;  Evaluated by cardiology, syncope is thought to be related to orthostatic hypotension. Stop trazodone.  Hold BP medications.  Follow orthostatic vitals Q shift.  Resume low rate IV fluids.  Zoloft decreased to  once a day.  Second opinion for cardiology per family request. Dr Nadara Eatongangi recommend to start midodrine.   Wandering Pacemaker MAT and 2;1 AV block ; Dr Nadara EatonGangi recommends Holter monitor.  Patient HR drop to 30's he was asleep. Nurse wake him up. He was hard to wake up. He wake up and was more sleepy.  EKG obtain and telemetry strips reviewed with Dr Jacinto HalimGanji. Plan is to consult EP.  Will check electrolytes and Magnesium level.   Hypoglycemia;  Cortisol level normal. Discussed with Dr Sharl MaKerr. We should still rule out adrenal insufficiency . Will order cosyntropin test  sulfonyl urea, insulin level, ce peptide. Insulin and C peptide probably not accurate by the time labs was drawn blood sugar was higher. Dr Sharl MaKerr agree to see patient in consultation. I appreciate his help./  Check cbg Q 4 hours.  Snack at bedtime.  TSH normal.  CBG at 59 this am, soon after recheck venous sample was at 79. Insulin level and c peptide cancer for this am. cbg was normal.  Nurse aware that if patient has cbg less than 60, we need to get insulin and C peptide levels right away.  ACTH test; cortisol 20 minutes after cosyntropin was at 20. Will discussed results with Endocrinologist   Acute encephalopathy;  Patient was more sleepy the morning of 8-14. Mentation fluctuates.  He was more alert this afternoon on my second round.  EEG done on admission, was negative for seizure like activities.  B 12 low normal. Started  supplement.  RPR ordered.  TSH normal. Ammonia negative.  Old left side weakness.  ABG hypoxemia. placed on 2 L oxygen.  Neurology consulted.  Started on Keppra due to concern for prior seizure like activities.  Addendum; 3;30 Pm; Call by nurse, that patient HR decreased to 32. He was asleep. She wake him up and he was hard to wake up, but subsequently he  wake up. He was lethargic. He would keep his eyes close. He would answer questions.  Patient was place on Oxygen. BP was normal 130. He subsequently  became more alert, confuse but conversant, speaking full sentences, following command. HR remain highs 40 and 50.  -Discussed case with neurologist who recommend EEG and orthostatic vitals. Hold on orthostatic vital now  to avoid precipitating events. Patient will be evaluated by EP.  -Discussed case with Dr Jacinto HalimGanji, he will consult EP.  -will continue to monitor closely.  -I will hold midodrine for overnight.   PNA; per daughter patient has been coughing. She is worry that patient didn't finished antibiotics.  -repeated chest x ray; showed atelectasis vs PNA/  -start empirically antibiotics due to cough, and encephalopathy.   History of CVA;  Continue with plavix and statin.   Moderate to severe aortic stenosis.  Cardiology recommended conservative management with repeat echo 6 months.   Orthostatic hypotension;  Related to dehydration.  Hold BP medications.  Resume IV fluids.  Started on midodrine by dr Nadara Eatongangi. Nurse called and clarify order with Dr Nadara Eatongangi.   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     DVT prophylaxis: Lovenox Code Status: full code.  Family Communication; daughter updated  Disposition Plan: SNF tomorrow  Consultants:   Cardiology   Procedures: EEG;    Antimicrobials: none   Subjective: I saw patient this am, he was alert conversant, feeling well.   Objective: Vitals:   01/17/18 2016 01/18/18 0438 01/18/18 1009 01/18/18 1144  BP: 121/80  (!) 142/48 (!) 103/57  Pulse: 84  60 (!) 58  Resp: 18   18  Temp: 98.5 F (36.9 C) 98.4 F (36.9 C)  98 F (36.7 C)  TempSrc: Oral Oral  Oral  SpO2: 97% 98% 100% 97%  Weight:  93.2 kg    Height:        Intake/Output Summary (Last 24 hours) at 01/18/2018 1418 Last data filed at 01/18/2018 1407 Gross per 24 hour  Intake 1620 ml  Output 500 ml  Net 1120 ml    Filed Weights   01/16/18 0336 01/17/18 0401 01/18/18 0438  Weight: 94 kg 93.4 kg 93.2 kg    Examination:  General exam: NAD Respiratory system: CTA Cardiovascular system:  S 1, S 2 RRR Gastrointestinal system: BS present, soft, nt Central nervous system: Alert, chronic left side weakness.  Extremities no edema Skin: no rashes.     Data Reviewed: I have personally reviewed following labs and imaging studies  CBC: Recent Labs  Lab 01/11/18 1603 01/15/18 0948 01/16/18 0959 01/17/18 0452  WBC 4.2 4.5 5.1 5.4  HGB 11.5* 11.1* 12.3* 11.2*  HCT 36.3* 35.5* 38.5* 35.1*  MCV 88.8 88.5 88.5 88.4  PLT 152 160 173 164   Basic Metabolic Panel: Recent Labs  Lab 01/13/18 0402 01/15/18 0948 01/16/18 0911 01/17/18 0452 01/18/18 0538 01/18/18 0821 01/18/18 0930  NA 141 140 139 140  --  139  --   K 3.8 3.4* 3.8 3.2*  --  3.8  --   CL 108 108 106 109  --  109  --  CO2 27 27 25 26   --  26  --   GLUCOSE 77 183* 138* 107* 79 74  --   BUN 7* 7* 7* 9  --  9  --   CREATININE 0.95 0.91 0.88 0.81  --  0.85 0.84  CALCIUM 8.0* 8.2* 8.7* 8.0*  --  8.0*  --   MG  --   --   --  1.9  --   --   --    GFR: Estimated Creatinine Clearance: 76.4 mL/min (by C-G formula based on SCr of 0.84 mg/dL). Liver Function Tests: No results for input(s): AST, ALT, ALKPHOS, BILITOT, PROT, ALBUMIN in the last 168 hours. No results for input(s): LIPASE, AMYLASE in the last 168 hours. Recent Labs  Lab 01/15/18 0948  AMMONIA 32   Coagulation Profile: No results for input(s): INR, PROTIME in the last 168 hours. Cardiac Enzymes: No results for input(s): CKTOTAL, CKMB, CKMBINDEX, TROPONINI in the last 168 hours. BNP (last 3 results) No results for input(s): PROBNP in the last 8760 hours. HbA1C: No results for input(s): HGBA1C in the last 72 hours. CBG: Recent Labs  Lab 01/17/18 2029 01/18/18 0125 01/18/18 0512 01/18/18 0734 01/18/18 1141  GLUCAP 116* 80 59* 85 111*   Lipid Profile: No  results for input(s): CHOL, HDL, LDLCALC, TRIG, CHOLHDL, LDLDIRECT in the last 72 hours. Thyroid Function Tests: No results for input(s): TSH, T4TOTAL, FREET4, T3FREE, THYROIDAB in the last 72 hours. Anemia Panel: No results for input(s): VITAMINB12, FOLATE, FERRITIN, TIBC, IRON, RETICCTPCT in the last 72 hours. Sepsis Labs: No results for input(s): PROCALCITON, LATICACIDVEN in the last 168 hours.  No results found for this or any previous visit (from the past 240 hour(s)).       Radiology Studies: No results found.      Scheduled Meds: . atorvastatin  40 mg Oral QHS  . brimonidine  1 drop Both Eyes BID  . clopidogrel  75 mg Oral Daily  . dextrose  1 ampule Intravenous Once  . enoxaparin (LOVENOX) injection  40 mg Subcutaneous Q24H  . feeding supplement (ENSURE ENLIVE)  237 mL Oral TID BM  . gabapentin  100 mg Oral QHS  . levETIRAcetam  500 mg Oral BID  . midodrine  10 mg Oral TID WC  . sertraline  25 mg Oral Daily  . sodium chloride flush  3 mL Intravenous Q12H  . vitamin B-12  100 mcg Oral Daily   Continuous Infusions: . sodium chloride 50 mL/hr at 01/17/18 1727  . ampicillin-sulbactam (UNASYN) IV 3 g (01/18/18 1227)     LOS: 6 days    Time spent: 35 minutes.     Alba Cory, MD [facility] Pager [phone]  If 7PM-7AM, please contact night-coverage [email or web address] Password [email or web address] 01/18/2018, 2:18 PM

## 2018-01-18 NOTE — Progress Notes (Signed)
Patient with no output yet. He has been drinking and on continuos fluids. Bladder scan done shows 357 ml. Paged MD. Awaiting on new orders.  Fleet Higham, RN

## 2018-01-18 NOTE — Progress Notes (Addendum)
Per MD Regalado Insulin level  and C peptide needs to be canceled(Lab) since blood was drawn when CBG was 85 instead when was 59.Results will be inaccurate. Lab informed.  Myasia Sinatra, RN

## 2018-01-18 NOTE — Progress Notes (Signed)
MD at bedside due to patient being sleepy and HR.  Yannis Gumbs, RN

## 2018-01-18 NOTE — Progress Notes (Signed)
Per lab ACTH tests are completed when the morning shift comes in.  Lab draw time adjusted and lab will coordinate with day nurse to complete.

## 2018-01-18 NOTE — Progress Notes (Addendum)
Called by Dr. Sunnie Nielsenegalado. Pt had episode of unresponsiveness. HR in 30s. Now coming around. Concern for neurological etiology. Recommended repeat EEG. Will see if abnormal. Also recommended orthostatic vitals now and if this episode recurs.  -- Dan DikesAshish Torrence Branagan, MD [facility] Pager: [phone] If 7pm to 7am, please call on call as listed on AMION.

## 2018-01-18 NOTE — Plan of Care (Signed)
  Problem: Clinical Measurements: Goal: Will remain free from infection Outcome: Progressing Goal: Respiratory complications will improve Outcome: Progressing Goal: Cardiovascular complication will be avoided Outcome: Progressing   Problem: Activity: Goal: Risk for activity intolerance will decrease Outcome: Progressing   Problem: Coping: Goal: Level of anxiety will decrease Outcome: Progressing   Problem: Safety: Goal: Ability to remain free from injury will improve Outcome: Progressing   Problem: Skin Integrity: Goal: Risk for impaired skin integrity will decrease Outcome: Progressing   

## 2018-01-19 LAB — GLUCOSE, CAPILLARY
GLUCOSE-CAPILLARY: 81 mg/dL (ref 70–99)
GLUCOSE-CAPILLARY: 104 mg/dL — AB (ref 70–99)
GLUCOSE-CAPILLARY: 89 mg/dL (ref 70–99)
Glucose-Capillary: 90 mg/dL (ref 70–99)
Glucose-Capillary: 111 mg/dL — ABNORMAL HIGH (ref 70–99)
Glucose-Capillary: 135 mg/dL — ABNORMAL HIGH (ref 70–99)

## 2018-01-19 MED ORDER — AMOXICILLIN-POT CLAVULANATE 875-125 MG PO TABS
1.0000 | ORAL_TABLET | Freq: Two times a day (BID) | ORAL | Status: DC
  Administered 2018-01-19 – 2018-01-20 (×4): 1 via ORAL
  Filled 2018-01-19 (×4): qty 1

## 2018-01-19 MED ORDER — MIDODRINE HCL 5 MG PO TABS
10.0000 mg | ORAL_TABLET | Freq: Three times a day (TID) | ORAL | Status: DC
  Administered 2018-01-19 – 2018-01-20 (×4): 10 mg via ORAL
  Filled 2018-01-19 (×4): qty 2

## 2018-01-19 MED ORDER — MIDODRINE HCL 5 MG PO TABS: 10.0000 mg | ORAL_TABLET | Freq: Three times a day (TID) | ORAL | Status: DC

## 2018-01-19 NOTE — Progress Notes (Signed)
Spoke with patient's nurse. Patient is receiving one medication IV. Patient has pulled out five IVs in six days. Wrapping site and use of safety mittens not effective. Nurse to contact primary to see if a PO antibiotic is sufficient.

## 2018-01-19 NOTE — Consult Note (Addendum)
ELECTROPHYSIOLOGY CONSULT NOTE  Patient ID: Dan Stone, MRN: [id number], DOB/AGE: 04-Apr-1934 84 y.o. Admit date: 01/11/2018 Date of Consult: 01/19/2018  Primary Physician: Deeann SaintBanks, Shannon R, MD Primary Cardiologist:  Lorenz CoasterJG Toa [name] is a 84 y.o. male who is being seen today for the evaluation of JG  at the request of heart block.   Chief Complaint: heart block     HPI [name] is a 84 y.o. male admitted with recurrent syncope.    Seen by me in consult for syncope 7/19 that occurred in the context of vomiting.  He also had pneumonia.  The issue was raised as to whether he had aspiration.  Swallowing study had been recommended but was never consummated.    His decreased mental status with normal heart rate consistent with a mechanism of neurally mediated hypotension.  He had an interval episode of syncope [facility] which [facility].  Is thought to be vasovagal.  AF was listed on the diagnosis although it was derived presumably from [place] where we did not for its presence.  This episode occurred following getting out of the car.  First responders found his blood pressure in the 60s with pulses in the 60s and 70s (EMS-run sheet) Hypotension persisted for up to 20 minutes.  In hospital orthostatic hypotension has been identified.  Patient was seen by Dr. Mitchell HeirMCr.  The family requested a second opinion it was seen by Dr. Ottis StainJG.  Dr. Mitchell HeirMCr appreciated Mobitz 1 physiology as well as blocked PACs.  There is a concern about aortic stenosis with discrepant measurements.  Echo was reviewed personally by Dr. Garth BignessPJ who felt that was moderate-severe but not critical.  He also had acute right visual field loss with retinal artery occlusion.  Neurology saw the patient.  Telemetry had been erroneously read as atrial fibrillation.  Most retinal artery occlusion is related to atheroemboli from the arteries as opposed to cardioembolic.   Past Medical History:    Diagnosis Date  . A-fib (HCC)   . AS (aortic stenosis)   . Blind right eye   . Bradycardia   . Chicken pox   . Dementia   . Frequent urinary tract infections   . Glaucoma   . Hypertension   . PNA (pneumonia)   . Stroke [facility](HCC)    5 years ago  . Syncope       Surgical History:  Past Surgical History:  Procedure Laterality Date  . EYE SURGERY       Home Meds: Prior to Admission medications   Medication Sig Start Date End Date Taking? Authorizing Provider  amLODipine (NORVASC) 5 MG tablet Take 1 tablet (5 mg total) by mouth at bedtime. 12/12/17  Yes [name], William F, MD  atorvastatin (LIPITOR) 40 MG tablet Take 1 tablet (40 mg total) by mouth daily at 6 PM. Patient taking differently: Take 40 mg by mouth at bedtime.  12/12/17  Yes [name], William F, MD  brimonidine (ALPHAGAN) 0.2 % ophthalmic solution Place 1 drop into both eyes 2 (two) times daily.   Yes [provider]  clopidogrel (PLAVIX) 75 MG tablet Take 1 tablet (75 mg total) by mouth daily. 12/12/17  Yes [name], William F, MD  gabapentin (NEURONTIN) 100 MG capsule Take 1 capsule (100 mg total) by mouth daily. 12/12/17  Yes [name], William F, MD  hydrochlorothiazide (HYDRODIURIL) 25 MG tablet Take 25 mg by mouth daily.  01/09/18  Yes [provider]  memantine (NAMENDA) 10  MG tablet Take 10 mg by mouth 2 (two) times daily.  09/18/17 09/18/18 Yes [provider]  risperiDONE (RISPERDAL) 1 MG tablet Take 1.5 tablets (1.5 mg total) by mouth 2 (two) times daily. Patient taking differently: Take 2 mg by mouth 2 (two) times daily.  12/12/17  Yes Pecola Lawless, MD  sertraline (ZOLOFT) 25 MG tablet Take 25 mg by mouth 2 (two) times daily.    Yes [provider]  timolol (BETIMOL) 0.5 % ophthalmic solution Place 1 drop into both eyes 2 (two) times daily.   Yes [provider]  traZODone (DESYREL) 50 MG tablet Take 1 tablet (50 mg total) by mouth at bedtime. 12/12/17  Yes Pecola Lawless, MD   acetaminophen (TYLENOL) 325 MG tablet Take 2 tablets (650 mg total) by mouth every 6 (six) hours as needed for mild pain (or Fever >/= 101). Patient not taking: Reported on 01/09/2018 12/09/17   [name], MD  aspirin EC 81 MG tablet Take 1 tablet (81 mg total) by mouth daily. Patient not taking: Reported on 01/09/2018 12/09/17 03/09/18  [name], MD    Inpatient Medications:  . amoxicillin-clavulanate  1 tablet Oral Q12H  . atorvastatin  40 mg Oral QHS  . brimonidine  1 drop Both Eyes BID  . clopidogrel  75 mg Oral Daily  . dextrose  1 ampule Intravenous Once  . enoxaparin (LOVENOX) injection  40 mg Subcutaneous Q24H  . feeding supplement (ENSURE ENLIVE)  237 mL Oral TID BM  . gabapentin  100 mg Oral QHS  . levETIRAcetam  500 mg Oral BID  . midodrine  10 mg Oral TID WC  . sertraline  25 mg Oral Daily  . sodium chloride flush  3 mL Intravenous Q12H  . vitamin B-12  100 mcg Oral Daily      Allergies:  Allergies  Allergen Reactions  . Xarelto [Rivaroxaban] Other (See Comments)    Per patients family, there is currently a lawsuit against xarelto in pt's name. Bleeding in the bowels    Social History   Socioeconomic History  . Marital status: Single    Spouse name: Not on file  . Number of children: Not on file  . Years of education: Not on file  . Highest education level: Not on file  Occupational History  . Not on file  Social Needs  . Financial resource strain: Not on file  . Food insecurity:    Worry: Not on file    Inability: Not on file  . Transportation needs:    Medical: Not on file    Non-medical: Not on file  Tobacco Use  . Smoking status: Never Smoker  . Smokeless tobacco: Never Used  Substance and Sexual Activity  . Alcohol use: Never    Frequency: Never  . Drug use: Never  . Sexual activity: Not Currently  Lifestyle  . Physical activity:    Days per week: Not on file    Minutes per session: Not on file  . Stress: Not on file   Relationships  . Social connections:    Talks on phone: Not on file    Gets together: Not on file    Attends religious service: Not on file    Active member of club or organization: Not on file    Attends meetings of clubs or organizations: Not on file    Relationship status: Not on file  . Intimate partner violence:    Fear of current or  ex partner: Not on file    Emotionally abused: Not on file    Physically abused: Not on file    Forced sexual activity: Not on file  Other Topics Concern  . Not on file  Social History Narrative  . Not on file     Family History  Problem Relation Age of Onset  . CAD Father      ROS:  Please see the history of present illness.     All other systems reviewed and negative.  Patient is demented and unable to give any further history   Physical Exam: Blood pressure 127/76, pulse (!) 48, temperature 98.3 F (36.8 C), temperature source Oral, resp. rate 18, height 5\' 8"  (1.727 m), weight 94 kg, SpO2 98 %. General: Well developed, well nourished male in no acute distress. Head: Normocephalic, atraumatic, sclera non-icteric, no xanthomas, nares are without discharge. EENT: normal Lymph Nodes:  none Back: without scoliosis/kyphosis , no CVA tendersness Neck: Negative for carotid bruits. JVD 8 Lungs: Clear bilaterally to auscultation without wheezes, rales, or rhonchi. Breathing is unlabored. Heart: RRR with S1 S2.  3/6 systolic  murmur , rubs, or gallops appreciated. Abdomen: Soft, non-tender, non-distended with normoactive bowel sounds. No hepatomegaly. No rebound/guarding. No obvious abdominal masses. Msk:  Strength and tone appear normal for age. Extremities: No clubbing or cyanosis. No edema.  Distal pedal pulses are 2+ and equal bilaterally. Skin: Warm and Dry Neuro: Alert and oriented X 1 thinks he is in [place] in the year 2002. CN III-XII intact Grossly normal sensory and motor function . Psych:  Responds to questions appropriately with  a normal affect.      Labs: Cardiac Enzymes No results for input(s): CKTOTAL, CKMB, TROPONINI in the last 72 hours. CBC Lab Results  Component Value Date   WBC 5.4 01/17/2018   HGB 11.2 (L) 01/17/2018   HCT 35.1 (L) 01/17/2018   MCV 88.4 01/17/2018   PLT 164 01/17/2018   PROTIME: No results for input(s): LABPROT, INR in the last 72 hours. Chemistry  Recent Labs  Lab 01/18/18 1614  NA 139  K 3.7  CL 106  CO2 25  BUN 7*  CREATININE 0.78  CALCIUM 8.4*  GLUCOSE 119*   Lipids Lab Results  Component Value Date   CHOL 239 (H) 12/06/2017   HDL 44 12/06/2017   LDLCALC 180 (H) 12/06/2017   TRIG 74 12/06/2017   BNP No results found for: PROBNP Thyroid Function Tests: No results for input(s): TSH, T4TOTAL, T3FREE, THYROIDAB in the last 72 hours.  Invalid input(s): FREET3    Miscellaneous No results found for: DDIMER  Radiology/Studies:  Dg Chest 2 View  Result Date: 01/16/2018 CLINICAL DATA:  Patient diagnosed with atrial fibrillation 01/08/2018. Bradycardia and syncope. EXAM: CHEST - 2 VIEW COMPARISON:  Single-view of the chest 01/11/2018. PA and lateral chest 12/04/2017. FINDINGS: The left hemidiaphragm is obscured consistent with left basilar airspace disease. The right lung is clear. No pneumothorax or pleural effusion. Heart size is mildly enlarged. Aortic atherosclerosis is noted. Degenerative disease about the right shoulder in thoracic spine. No acute or focal bony abnormality. IMPRESSION: Left basilar airspace disease which could be atelectasis or pneumonia. Electronically Signed   By: Drusilla Kannerhomas  Dalessio M.D.   On: 01/16/2018 10:46   Ct Head Wo Contrast  Result Date: 01/11/2018 CLINICAL DATA:  Syncopal episode while getting out of car today. EXAM: CT HEAD WITHOUT CONTRAST TECHNIQUE: Contiguous axial images were obtained from the base of the skull through the  vertex without intravenous contrast. COMPARISON:  December 04, 2017 FINDINGS: Brain: No evidence of acute  infarction, hemorrhage, hydrocephalus, extra-axial collection or mass lesion/mass effect. There is chronic diffuse atrophy. Chronic bilateral periventricular white matter small vessel ischemic changes identified. Vascular: No hyperdense vessel or unexpected calcification. Skull: Normal. Negative for fracture or focal lesion. Sinuses/Orbits: No acute finding. Mild mucoperiosteal thickening of bilateral ethmoid sinuses are noted. Other: None. IMPRESSION: No focal acute intracranial abnormality identified. Chronic diffuse atrophy and chronic bilateral periventricular white matter small vessel ischemic change. Electronically Signed   By: Sherian Rein M.D.   On: 01/11/2018 19:15   Dg Chest Port 1 View  Result Date: 01/11/2018 CLINICAL DATA:  Syncope and vomiting today. EXAM: PORTABLE CHEST 1 VIEW COMPARISON:  December 04, 2017 FINDINGS: The heart size and mediastinal contours are within normal limits. Both lungs are clear. The visualized skeletal structures are stable. IMPRESSION: No active cardiopulmonary disease. Electronically Signed   By: Sherian Rein M.D.   On: 01/11/2018 17:08    EKG: 8/11 sinus rhythm with Mobitz 1 second-degree heart block  4: 3 01/11/2018 sinus rhythm with long cycle Wenckebach 12/08/2017 sinus rhythm with PACs some conducted and some blocked 12/05/2017 sinus bradycardia with a competing ectopic atrial rhythm versus a faster sinus rate with occasional blocked PAC  Assessment and Plan:    Syncope-neurally mediated associated with vomiting protracted hypotension  Sinus node dysfunction  Mobitz 1 second-degree heart block  Aortic stenosis-moderate-severe  Confusion   Persistent left airspace disease  Right eye visual loss-acute   R carotids with minimal obstruction      The patient has recurrent syncope with documented orthostasis protracted hypotension in the setting of a stable heart rate.  As suggested previously this is most likely neurally mediated.  His  bradycardia I do not think is related and hence, no pacing is indicated.  Decision is been made previously regarding anticoagulation from his syncope.  Statistical likelihood that his right central retinal artery occlusion he is from the arterial bed and not atrial fibrillation.  Orthostasis is a difficult issue to manage.  As suggested by Dr. Mitchell Heir, should be for systolic blood pressure in the 160+ range may allow him to stand with less likelihood of falling.  ProAmatine has been tried with little success.  Recommend the use of an abdominal binder.  With persistent left lower lung airspace disease, I think the issue of aspiration to be excluded.  I will take the liberty of ordering a swallowing study.  We will sign off.  Dr. Jacinto Halim is available for further questions.   Sherryl Manges

## 2018-01-19 NOTE — Progress Notes (Signed)
Multiple pages to MD regarding patient not keeping his tele on, pulling out IV, taking off 02.  MD desires to maintain IV site.  IV team just placed the 7th IV in 5 days for patient.  Patient will not leave tele leads on as they are being replaced as needed.  Patient not leaving 02 on.  Requires very frequent monitoring.

## 2018-01-19 NOTE — Progress Notes (Signed)
SLP Cancellation Note  Patient Details Name: Dan Stone MRN: [id number] DOB: 1933/08/06   Cancelled treatment:       Reason Eval/Treat Not Completed: Other (comment). Orders for MBS received; reviewed MD note and will follow up next date for MBS as fluoro schedule limited on weekends.  Dan Stone, [place], CCC-SLP Speech-Language Pathologist [phone]    Dan Stone 01/19/2018, 1:55 PM

## 2018-01-19 NOTE — Progress Notes (Signed)
PROGRESS NOTE    Dan Stone  ZHY:[id number] DOB: Feb 07, 1934 DOA: 01/11/2018 PCP: Deeann Saint, MD   Brief Narrative: Brief Narrative: 84 year old man just moved here from [place], [place] severe aortic stenosis, dementia, stroke, atrial fibrillation presented with history of syncope outside, while wearing warm clothing.  Reportedly had systolic blood pressures in the 60s per EMS.  Patient confused and vomited after syncopal episode.  Recently treated for pneumonia and [name], and was still on Levaquin. Chest x ray on admission was negative for PNA.   Patient had unresponsive episode in the hospital on 8-11, he regain consciousness. He had an EEG done that was negative for seizure. His syncope episode was thought to be related to orthostatic hypotension. For this reason his BP medications were discontinue. He was notice to have 2;1 AV block. Cardiology does not recommend pacemaker.   Patient on 8-14, was more sleepy than usual, kept eyes close but was answering questions. I got blood gas, which was negative for hypercapnia, did showed hypoxemia. He was place on oxygen. Ammonia level normal, B 12 low normal. B 12 supplements started. RPR pending. Neurology has been consulted.   Second opinion from cardiology requested by patient's daughter. Dr Jacinto Halim consulted. He agrees that syncope likely related to orthostatic hypotension. patient will need Holter monitor. Q shift of orthostatic vitals.    Assessment & Plan:   Principal Problem:   Syncope and collapse Active Problems:   Essential hypertension   Dementia   Nonrheumatic aortic valve stenosis   Hypotension   Wandering atrial pacemaker   Aortic stenosis, moderate   Orthostatic hypotension   Syncope  1-Syncope;  Evaluated by cardiology, syncope is thought to be related to orthostatic hypotension. Stop trazodone.  Hold BP medications.  Follow orthostatic vitals Q shift.  Resume low rate IV fluids.  Zoloft decreased to  once a day.  Second opinion for cardiology per family request. Dr Nadara Eaton recommend to start midodrine.  Continue with midodrine and abdominal binder. Out of bed.   Wandering Pacemaker MAT and 2;1 AV block ; Dr Nadara Eaton recommends Holter monitor.  Patient HR drop to 30's he was asleep. Nurse wake him up, when he wake up and was more sleepy.  EKG obtain and telemetry strips reviewed with Dr Jacinto Halim. Plan is to consult EP.  Evaluated by EP, no need for pacemaker.   Hypoglycemia;  Cortisol level normal. Discussed with Dr Sharl Ma. We should still rule out adrenal insufficiency . Will order cosyntropin test  sulfonyl urea, insulin level, ce peptide. Insulin and C peptide probably not accurate by the time labs was drawn blood sugar was higher. Dr Sharl Ma agree to see patient in consultation. I appreciate his help./  Check cbg Q 4 hours.  Snack at bedtime.  TSH normal.  Adrenal insufficiency was rule out.   Acute encephalopathy;  Patient was more sleepy the morning of 8-14. Mentation fluctuates.  He was more alert this afternoon on my second round.  EEG done on admission, was negative for seizure like activities.  B 12 low normal. Started  supplement.  RPR ordered. TSH normal. Ammonia negative. ABG hypoxemia. placed on 2 L oxygen.  Neurology consulted.  -Started on Keppra due to concern for prior seizure like activities.  8-17; Call by nurse, that patient HR decreased to 32. He was asleep. She wake him up and he was hard to wake up, but subsequently he  wake up. He was lethargic. He would keep his eyes close. He would answer questions.  -  Patient was place on Oxygen. BP was normal 130. He subsequently became more alert, confuse but conversant, speaking full sentences, following command. HR remain highs 40 and 50.  -Discussed case with neurologist who recommend EEG. -Discussed case with Dr Jacinto HalimGanji,  EP consulted.  -Dr Graciela HusbandsKlein think episodes are related to orthostatic hypotension. Neurally mediated. No need for  pacemaker.     PNA; per daughter patient has been coughing. She is worry that patient didn't finished antibiotics.  -repeated chest x ray; showed atelectasis vs PNA/  -start empirically antibiotics due to cough, and encephalopathy.  -Day 4 IV antibiotics. Change to oral antibiotics.   History of CVA;  Continue with plavix and statin.   Moderate to severe aortic stenosis.  Cardiology recommended conservative management with repeat echo 6 months.   Orthostatic hypotension;  Related to dehydration.  Hold BP medications.  Resume IV fluids.  Started on midodrine by dr Nadara Eatongangi. Nurse called and clarify order with Dr Nadara Eatongangi.   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     DVT prophylaxis: Lovenox Code Status: full code.  Family Communication; daughter updated  Disposition Plan: SNF tomorrow  Consultants:   Cardiology   Procedures: EEG;    Antimicrobials: none   Subjective: Patient alert, he has been removing IV access and telemetry.  Discussed with nurse will get him activities vest  He is alert, ab=nswering question.   Objective: Vitals:   01/18/18 1900 01/19/18 0438 01/19/18 1139 01/19/18 1150  BP: 138/60 120/84 127/76   Pulse: (!) 48 75 (!) 48   Resp: 18 18 18    Temp: 98.2 F (36.8 C) 98.5 F (36.9 C) 98.3 F (36.8 C)   TempSrc: Oral Oral Oral   SpO2: 100% 100% 100% 98%  Weight:  94 kg    Height:        Intake/Output Summary (Last 24 hours) at 01/19/2018 1322 Last data filed at 01/19/2018 1150 Gross per 24 hour  Intake 2060.55 ml  Output 501 ml  Net 1559.55 ml   Filed Weights   01/17/18 0401 01/18/18 0438 01/19/18 0438  Weight: 93.4 kg 93.2 kg 94 kg    Examination:  General exam: NAD Respiratory system: CTA Cardiovascular system:  S 1, S 2 RRR Gastrointestinal system: BS present, soft, nt Central nervous  system: Alert, left chronic side weakness.  Extremities no edema Skin: No rashes.     Data Reviewed: I have personally reviewed following labs and imaging studies  CBC: Recent Labs  Lab 01/15/18 0948 01/16/18 0959 01/17/18 0452  WBC 4.5 5.1 5.4  HGB 11.1* 12.3* 11.2*  HCT 35.5* 38.5* 35.1*  MCV 88.5 88.5 88.4  PLT 160 173 164   Basic Metabolic Panel: Recent Labs  Lab 01/15/18 0948 01/16/18 0911 01/17/18 0452 01/18/18 0538 01/18/18 0821 01/18/18 0930 01/18/18 1614  NA 140 139 140  --  139  --  139  K 3.4* 3.8 3.2*  --  3.8  --  3.7  CL 108 106 109  --  109  --  106  CO2 27 25 26   --  26  --  25  GLUCOSE 183* 138* 107* 79 74  --  119*  BUN 7* 7* 9  --  9  --  7*  CREATININE 0.91 0.88 0.81  --  0.85 0.84 0.78  CALCIUM 8.2* 8.7* 8.0*  --  8.0*  --  8.4*  MG  --   --  1.9  --   --   --  2.0   GFR: Estimated Creatinine Clearance: 80.5 mL/min (by C-G formula based on SCr of 0.78 mg/dL). Liver Function Tests: No results for input(s): AST, ALT, ALKPHOS, BILITOT, PROT, ALBUMIN in the last 168 hours. No results for input(s): LIPASE, AMYLASE in the last 168 hours. Recent Labs  Lab 01/15/18 0948  AMMONIA 32   Coagulation Profile: No results for input(s): INR, PROTIME in the last 168 hours. Cardiac Enzymes: No results for input(s): CKTOTAL, CKMB, CKMBINDEX, TROPONINI in the last 168 hours. BNP (last 3 results) No results for input(s): PROBNP in the last 8760 hours. HbA1C: No results for input(s): HGBA1C in the last 72 hours. CBG: Recent Labs  Lab 01/18/18 2022 01/18/18 2355 01/19/18 0436 01/19/18 0836 01/19/18 1141  GLUCAP 149* 101* 90 81 111*   Lipid Profile: No results for input(s): CHOL, HDL, LDLCALC, TRIG, CHOLHDL, LDLDIRECT in the last 72 hours. Thyroid Function Tests: No results for input(s): TSH, T4TOTAL, FREET4, T3FREE, THYROIDAB in the last 72 hours. Anemia Panel: No results for input(s): VITAMINB12, FOLATE, FERRITIN, TIBC, IRON, RETICCTPCT in the  last 72 hours. Sepsis Labs: No results for input(s): PROCALCITON, LATICACIDVEN in the last 168 hours.  No results found for this or any previous visit (from the past 240 hour(s)).       Radiology Studies: No results found.      Scheduled Meds: . amoxicillin-clavulanate  1 tablet Oral Q12H  . atorvastatin  40 mg Oral QHS  . brimonidine  1 drop Both Eyes BID  . clopidogrel  75 mg Oral Daily  . dextrose  1 ampule Intravenous Once  . enoxaparin (LOVENOX) injection  40 mg Subcutaneous Q24H  . feeding supplement (ENSURE ENLIVE)  237 mL Oral TID BM  . gabapentin  100 mg Oral QHS  . levETIRAcetam  500 mg Oral BID  . midodrine  10 mg Oral TID WC  . sertraline  25 mg Oral Daily  . sodium chloride flush  3 mL Intravenous Q12H  . vitamin B-12  100 mcg Oral Daily   Continuous Infusions: . sodium chloride 50 mL/hr at 01/17/18 1727     LOS: 7 days    Time spent: 35 minutes.     Alba CoryBelkys A Regalado, MD [facility] Pager [phone]  If 7PM-7AM, please contact night-coverage [email or web address] Password [email or web address] 01/19/2018, 1:22 PM

## 2018-01-19 NOTE — Progress Notes (Signed)
Subjective: Dan Stone reports feeling "better and better".  He appreciates his improvement since his hospital admission.  He does not recall any loss of consciousness yesterday.    Social history:  He attended [employer] in [place], [place] "a long time ago".  He is a CuratorChristian.    Review of systems: Gastrointestinal:  No vomiting, no diarrhea. Respiratory:  No cough.  Objective: Vital signs in last 24 hours: Temp:  [98 F (36.7 C)-98.5 F (36.9 C)] 98.5 F (36.9 C) (08/18 0438) Pulse Rate:  [48-75] 75 (08/18 0438) Resp:  [18] 18 (08/18 0438) BP: (103-142)/(48-84) 120/84 (08/18 0438) SpO2:  [97 %-100 %] 100 % (08/18 0438) Weight:  [94 kg] 94 kg (08/18 0438) Weight change: 0.786 kg Last BM Date: 01/18/18  Intake/Output from previous day: 08/17 0701 - 08/18 0700 In: 2360.6 [P.O.:940; I.V.:1225.6; IV Piggyback:195] Out: 501 [Urine:500; Stool:1] Intake/Output this shift: No intake/output data recorded.  General: No apparent distress. Eyes: Anicteric, no lid lag. Neck: Supple, trachea midline. Thyroid: No appreciable enlargement, mobile without fixation, no tenderness, but partially substernal which limits exam. Cardiovascular:  Murmur, no visible cyanosis. Respiratory: Normal respiratory effort, clear to auscultation over anterior lung fields. Gastrointestinal: Normal pitch active bowel sounds, nontender abdomen without distention or appreciable hepatomegaly. Neurologic: Cranial nerves normal as tested, deep tendon reflexes symmetric. Musculoskeletal: Normal muscle tone, no muscle atrophy. Skin: Appropriate warmth, no visible rash. Mental status: Alert, conversant, speech clear, appropriate mood and affect, no hallucinations or delusions evident. Hematologic/lymphatic: No cervical adenopathy, no jaundice.  Lab Results:Studies/Results: Lab Results  Component Value Date   GLUCOSE 119 (H) 01/18/2018   GLUCOSE 74 01/18/2018   GLUCOSE 79 01/18/2018    GLUCOSE 107 (H) 01/17/2018   GLUCOSE 138 (H) 01/16/2018   GLUCOSE 183 (H) 01/15/2018   GLUCOSE 77 01/13/2018   ACTH stimulation test:  Cortisol above 20 at 30 minutes and 60 minutes.  Medications: I have reviewed the patient's current medications.  Assessment/Plan: 1.  Syncope 2.  Abnormal fingerstick lab report  The ACTH stimulation test had normal results.  No lab evidence of primary adrenal insufficiency or chronic central adrenal insufficiency.    All of the available plasma or serum glucose levels are unequivocally normal (>70 mg/dL) or mildly high.  His clinical presentation does not seem typical for hypoglycemia.  All but one of his fingerstick blood glucose levels were above 55 mg/dL.  The plasma glucose level was 79 mg/dL when obtained after fingerstick glucose report of 59 mg/dL--though no recorded intake of any caloric intake between those two measurements.  For multiple reasons, the undetectable fingerstick glucose result and the low-normal fingerstick glucose result seem to be misleading results.    In summary:  No clear evidence of adrenal insufficiency.  No confirmation of hypoglycemia.    Recommendation:   I recommend an office visit with me after his hospital stay.    If any additional questions or concerns arise before he leaves the hospital, then please call me.  Thank you.   LOS: 7 days   Sabre Romberger 01/19/2018, 7:49 AM

## 2018-01-20 ENCOUNTER — Inpatient Hospital Stay (HOSPITAL_COMMUNITY): Payer: Medicare Other

## 2018-01-20 LAB — BASIC METABOLIC PANEL
Anion gap: 6 (ref 5–15)
BUN: 9 mg/dL (ref 8–23)
CHLORIDE: 108 mmol/L (ref 98–111)
CO2: 27 mmol/L (ref 22–32)
Calcium: 8.3 mg/dL — ABNORMAL LOW (ref 8.9–10.3)
Creatinine, Ser: 0.8 mg/dL (ref 0.61–1.24)
GFR calc Af Amer: >60 mL/min (ref >60)
GFR calc non Af Amer: >60 mL/min (ref >60)
Glucose, Bld: 102 mg/dL — ABNORMAL HIGH (ref 70–99)
Potassium: 3.6 mmol/L (ref 3.5–5.1)
SODIUM: 141 mmol/L (ref 135–145)

## 2018-01-20 LAB — VITAMIN D 25 HYDROXY (VIT D DEFICIENCY, FRACTURES): Vit D, 25-Hydroxy: 8.6 ng/mL — ABNORMAL LOW (ref 30.0–100.0)

## 2018-01-20 LAB — GLUCOSE, CAPILLARY
GLUCOSE-CAPILLARY: 116 mg/dL — AB (ref 70–99)
Glucose-Capillary: 76 mg/dL (ref 70–99)
Glucose-Capillary: 77 mg/dL (ref 70–99)
Glucose-Capillary: 98 mg/dL (ref 70–99)
Glucose-Capillary: 111 mg/dL — ABNORMAL HIGH (ref 70–99)

## 2018-01-20 MED ORDER — AMOXICILLIN-POT CLAVULANATE 875-125 MG PO TABS: 1.0000 | ORAL_TABLET | Freq: Two times a day (BID) | ORAL | 0 refills | Status: DC

## 2018-01-20 MED ORDER — MIDODRINE HCL 10 MG PO TABS: 10.0000 mg | ORAL_TABLET | Freq: Three times a day (TID) | ORAL | 0 refills | Status: DC

## 2018-01-20 MED ORDER — CYANOCOBALAMIN 100 MCG PO TABS: 100.0000 ug | ORAL_TABLET | Freq: Every day | ORAL | 0 refills | Status: DC

## 2018-01-20 MED ORDER — GABAPENTIN 100 MG PO CAPS: 100.0000 mg | ORAL_CAPSULE | Freq: Every day | ORAL | 0 refills | Status: DC

## 2018-01-20 MED ORDER — ENSURE ENLIVE PO LIQD: 237.0000 mL | Freq: Three times a day (TID) | ORAL | 12 refills | Status: DC

## 2018-01-20 MED ORDER — LEVETIRACETAM 500 MG PO TABS: 500.0000 mg | ORAL_TABLET | Freq: Two times a day (BID) | ORAL | 0 refills | Status: DC

## 2018-01-20 NOTE — Progress Notes (Signed)
EEG complete, results pending 

## 2018-01-20 NOTE — Progress Notes (Signed)
Report [facility], they said they have no cut off time to receive patients.   [facility] called and transport set up. [facility] representative states there are several people ahead of him and with the power being out it may take awhile.   Will relay information to oncoming RN.

## 2018-01-20 NOTE — Progress Notes (Signed)
Subjective:  No specific complaints. No syncope while here. Having EEG today  Objective:  Vital Signs in the last 24 hours: Temp:  [98.4 F (36.9 C)-98.5 F (36.9 C)] 98.5 F (36.9 C) (08/19 1200) Pulse Rate:  [44-77] 44 (08/19 1224) Resp:  [16-18] 16 (08/19 0436) BP: (146-171)/(65-76) 158/65 (08/19 1224) SpO2:  [98 %-100 %] 98 % (08/19 1224) Weight:  [96.8 kg] 96.8 kg (08/19 0043)  Intake/Output from previous day: 08/18 0701 - 08/19 0700 In: 300 [P.O.:300] Out: 1200 [Urine:1200]  Physical Exam: Constitutional: He appears well-developed and well-nourished. No distress.  HENT:  Head: Atraumatic.  Eyes: Conjunctivae are normal.  Neck: Neck supple. No JVD present. No thyromegaly present.  Cardiovascular: Normal rate.  Murmur (2/6 crescendo midsystolic murmur at the apex and right sternal border.  No gallop or rub.) heard. Frequent ectopy.  Pulmonary/Chest: Effort normal and breath sounds normal.  Abdominal: Soft. Bowel sounds are normal.  Musculoskeletal: He exhibits no edema or tenderness.  Neurological: He is alert.  Lab Results: BMP Recent Labs    01/18/18 0821 01/18/18 0930 01/18/18 1614 01/20/18 1422  NA 139  --  139 141  K 3.8  --  3.7 3.6  CL 109  --  106 108  CO2 26  --  25 27  GLUCOSE 74  --  119* 102*  BUN 9  --  7* 9  CREATININE 0.85 0.84 0.78 0.80  CALCIUM 8.0*  --  8.4* 8.3*  GFRNONAA >60 >60 >60 >60  GFRAA >60 >60 >60 >60    CBC Recent Labs  Lab 01/17/18 0452  WBC 5.4  RBC 3.97*  HGB 11.2*  HCT 35.1*  PLT 164  MCV 88.4  MCH 28.2  MCHC 31.9  RDW 13.5    HEMOGLOBIN A1C Lab Results  Component Value Date   HGBA1C 5.6 12/06/2017   MPG 114.02 12/06/2017    Cardiac Panel (last 3 results) Recent Labs    12/05/17 0454 12/05/17 1034 12/05/17 1910  TROPONINI <0.03 <0.03 <0.03  TSH Recent Labs    12/05/17 0454 01/15/18 0948  TSH 1.712 1.341    Lipid Panel     Component Value Date/Time   CHOL 239 (H) 12/06/2017 0617   TRIG 74  12/06/2017 0617   HDL 44 12/06/2017 0617   CHOLHDL 5.4 12/06/2017 0617   VLDL 15 12/06/2017 0617   LDLCALC 180 (H) 12/06/2017 0617   Hepatic Function Panel Recent Labs    12/04/17 2321 12/05/17 0454  PROT 6.0* 5.9*  ALBUMIN 2.9* 2.8*  AST 19 20  ALT 13 13  ALKPHOS 78 75  BILITOT 1.4* 1.6*  BILIDIR 0.3*  --   IBILI 1.1*  --    Imaging: Dg Swallowing Func-speech Pathology  Result Date: 01/20/2018 Objective Swallowing Evaluation: Type of Study: MBS-Modified Barium Swallow Study  Patient Details Name: Dan Stone MRN: [id number] Date of Birth: [birth year] Today's Date: 01/20/2018 Time: SLP Start Time (ACUTE ONLY): [id number] -SLP Stop Time (ACUTE ONLY): 0939 SLP Time Calculation (min) (ACUTE ONLY): 11 min Past Medical History: Past Medical History: Diagnosis Date . A-fib (HCC)  . AS (aortic stenosis)  . Blind right eye  . Bradycardia  . Chicken pox  . Dementia  . Frequent urinary tract infections  . Glaucoma  . Hypertension  . PNA (pneumonia)  . Stroke [facility](HCC)   5 years ago . Syncope  Past Surgical History: Past Surgical History: Procedure Laterality Date . EYE SURGERY   HPI: Dan Stone is a 84 y.o.  male with medical history significant of dementia, aortic stenosis hypertension presents with syncope. Per chart patient was treated for pneumonia in [place] earlier this month and has 2 more days on Levaquin.  CXR 8/15 Left basilar airspace disease which could be atelectasis or  Subjective: pt alert, pleasant mood, needs cues Assessment / Plan / Recommendation CHL IP CLINICAL IMPRESSIONS 01/20/2018 Clinical Impression Oropharyngeal swallow function found to be within normal limits within the confines of this MBS. Mastication of solid texture functional with mild challenge manipulating/coordinating dual consistencies of pill and liquid and needed multiple sips to assist pill transfer to posterior oral cavity. Swallow initiation timely with normal amount of post lingual residue falling to  vallecuale, sensed, swallowed and cleared. Hyolaryngeal excursion, laryngeal elevation and epiglottic deflection normal to prevent penetration into laryngeal vestibule with any consistency. Esophageal scan without abnormalities however MBS only diagnoses the UES. Pt to continue regular texture, thin liquids, straws allowed and may consider pills with puree if difficulty transiting larger pills with thin. No ST follow up recommended at this time.  SLP Visit Diagnosis Dysphagia, unspecified (R13.10) Attention and concentration deficit following -- Frontal lobe and executive function deficit following -- Impact on safety and function Mild aspiration risk   CHL IP TREATMENT RECOMMENDATION 01/20/2018 Treatment Recommendations No treatment recommended at this time   No flowsheet data found. CHL IP DIET RECOMMENDATION 01/20/2018 SLP Diet Recommendations Regular solids;Thin liquid Liquid Administration via Straw;Cup Medication Administration Whole meds with liquid Compensations -- Postural Changes Seated upright at 90 degrees   CHL IP OTHER RECOMMENDATIONS 01/20/2018 Recommended Consults -- Oral Care Recommendations Oral care BID Other Recommendations --   CHL IP FOLLOW UP RECOMMENDATIONS 01/20/2018 Follow up Recommendations None   CHL IP FREQUENCY AND DURATION 12/05/2017 Speech Therapy Frequency (ACUTE ONLY) min 2x/week Treatment Duration 1 week      CHL IP ORAL PHASE 01/20/2018 Oral Phase WFL Oral - Pudding Teaspoon -- Oral - Pudding Cup -- Oral - Honey Teaspoon -- Oral - Honey Cup -- Oral - Nectar Teaspoon -- Oral - Nectar Cup -- Oral - Nectar Straw -- Oral - Thin Teaspoon -- Oral - Thin Cup -- Oral - Thin Straw -- Oral - Puree -- Oral - Mech Soft -- Oral - Regular -- Oral - Multi-Consistency -- Oral - Pill -- Oral Phase - Comment --  CHL IP PHARYNGEAL PHASE 01/20/2018 Pharyngeal Phase WFL Pharyngeal- Pudding Teaspoon -- Pharyngeal -- Pharyngeal- Pudding Cup -- Pharyngeal -- Pharyngeal- Honey Teaspoon -- Pharyngeal --  Pharyngeal- Honey Cup -- Pharyngeal -- Pharyngeal- Nectar Teaspoon -- Pharyngeal -- Pharyngeal- Nectar Cup -- Pharyngeal -- Pharyngeal- Nectar Straw -- Pharyngeal -- Pharyngeal- Thin Teaspoon -- Pharyngeal -- Pharyngeal- Thin Cup -- Pharyngeal -- Pharyngeal- Thin Straw -- Pharyngeal -- Pharyngeal- Puree -- Pharyngeal -- Pharyngeal- Mechanical Soft -- Pharyngeal -- Pharyngeal- Regular -- Pharyngeal -- Pharyngeal- Multi-consistency -- Pharyngeal -- Pharyngeal- Pill -- Pharyngeal -- Pharyngeal Comment --  CHL IP CERVICAL ESOPHAGEAL PHASE 01/20/2018 Cervical Esophageal Phase WFL Pudding Teaspoon -- Pudding Cup -- Honey Teaspoon -- Honey Cup -- Nectar Teaspoon -- Nectar Cup -- Nectar Straw -- Thin Teaspoon -- Thin Cup -- Thin Straw -- Puree -- Mechanical Soft -- Regular -- Multi-consistency -- Pill -- Cervical Esophageal Comment -- Royce Macadamia 01/20/2018, 10:07 AM  Breck Coons Lonell Face.Ed CCC-SLP Pager [phone]              CARDIAC STUDIES: EKG 01/13/2018 Multiple EKGs reviewed since admission, he has had brief episode of Wenckebach phenomena otherwise  poor R wave progression and nonspecific T abnormality.  Episodes of nonconducted PACs and sinus arrest of less than 1.5 seconds.  No high degree AV block, no other significant arrhythmias, no atrial fibrillation.  ECHO: 12/05/2017: Normal LV systolic function, EF 60 to 65%.  Moderate to severe aortic stenosis with a mean gradient of 31 mmHg, calculated aortic valve of 0.69 cm.  Brief History: Dan Stone  is a 84 y.o. male  With Patient with advanced dementia who was living in [place] recently moved in with his daughter here in [place], admitted on 12/04/2017 with syncope and again on 01/08/2018 evaluated in the ED felt to be due to  respiratory infection and  and dehydration, again admitted on 01/11/2018 with syncope.  Patient's daughter had taken him shopping and while he was trying to get out of the car he started feeling weak, nauseous and  he threw up and followed by frank syncope and was brought to the emergency room.  When the EMS arrived he was found to be hypotensive.  He was seen by Dr. Thurmon FairMihai Croitoru who did an extensive consultation and evaluation and made recommendation that he probably will benefit from pacemaker implantation, due to patient's advanced dementia, severe aortic stenosis, severe orthostatic hypotension which can explain his presentation along with dehydration.  However patient's daughter wanted a second opinion hence I was called upon.  Assessment: 1.  Syncope secondary to severe orthostatic hypotension and compounded by moderate to severe aortic stenosis. 2.  History of atrial fibrillation, no documentation available, was put on Xarelto per history, had severe GI bleed. Dr. Graciela HusbandsKlein consulted: No A. Fib. Erroneous diagnosis of A. Fib, right retinal artery occlusion felt to be atheroemboli. CHA2DS2-VASCScore: Risk Score  5, (hypertension, age greater than 3175, history of CVA in the past) 3. Sinus node dysfunction and AV node dysfunction without high degree block or sinus arrest.  4.  Advanced dementia 5. Moderate to severe AS  Recommendation: Patient's syncope cannot be explained by underlying sinus bradycardia nor does his orthostatic hypotension.  Again unless there is underlying high degree AV block or sinus arrest of greater than 3 seconds, no indication for pacemaker.  Continue midodrine for orthostatic hypotension.  Would that most recommend event monitor for 30 days.  Again given his multiple medical comorbidity, although patient's family is very insistent that he is very functional, long-term prognosis remains poor with regard to total functional recovery.  I will be happy to see him back in the office with a 30-day event monitor.   Yates DecampJay Sherby Moncayo, M.D. 01/20/2018, 6:52 PM [facility] Pager: [phone] Office: [phone] If no answer: [phone]

## 2018-01-20 NOTE — Discharge Summary (Addendum)
Physician Discharge Summary  Dan FavorJames Arthur [name] RUE:[id number] DOB: [birth year] DOA: 01/11/2018  PCP: Deeann SaintBanks, Shannon R, MD  Admit date: 01/11/2018 Discharge date: 01/20/2018  Admitted From: Home  Disposition:  SNF  Recommendations for Outpatient Follow-up:  1. Follow up with PCP in 1-2 weeks 2. Please obtain BMP/CBC in one week 3. Follow up with Dr Jacinto HalimGanji for further care of orthostatic hypotension, and bradycardia  4. Follow up with Dr Sharl MaKerr, for further  evaluation of episodes of hypoglycemia.  5. Pt should be out of bed as much as possible and in reverse trendelenburg when in bed , Abdominal binder should be applied when HE IS OUT OF BED.   Discharge Condition: Stable.  CODE STATUS: full code.  Diet recommendation: Heart Healthy   Brief/Interim Summary: Brief Narrative: Brief Narrative: 84 year [place], dementia, stroke, atrial fibrillation presented with history of syncope outside, while wearing warm clothing. Reportedly had systolic blood pressures in the 60s per EMS. Patient confused and vomited after syncopal episode. Recently treated for pneumonia and [name], and was still on Levaquin. Chest x ray on admission was negative for PNA.   Patient had unresponsive episode in the hospital on 8-11, he regain consciousness. He had an EEG done that was negative for seizure. His syncope episode was thought to be related to orthostatic hypotension. For this reason his BP medications were discontinue. He was notice to have 2;1 AV block. Cardiology does not recommend pacemaker.   Patient on 8-14, was more sleepy than usual, kept eyes close but was answering questions. I got blood gas, which was negative for hypercapnia, did showed hypoxemia. He was place on oxygen. Ammonia level normal, B 12 low normal. B 12 supplements started. RPR pending. Neurology has been consulted.   Second opinion from cardiology requested by patient's  daughter. Dr Jacinto HalimGanji consulted. He agrees that syncope likely related to orthostatic hypotension. patient will need Holter monitor. Q shift of orthostatic vitals.    Assessment & Plan:   Principal Problem:   Syncope and collapse Active Problems:   Essential hypertension   Dementia   Nonrheumatic aortic valve stenosis   Hypotension   Wandering atrial pacemaker   Aortic stenosis, moderate   Orthostatic hypotension   Syncope  1-Syncope;  Evaluated by cardiology, syncope is thought to be related to orthostatic hypotension. Stop trazodone.  Hold BP medications.  Follow orthostatic vitals Q shift.  Resume low rate IV fluids.  Zoloft decreased to once a day.  Second opinion for cardiology per family request. Dr Nadara Eatongangi recommend to start midodrine.  Continue with midodrine and abdominal binder. Out of bed.   Wandering Pacemaker MAT and 2;1 AV block ; Bradycardia/.  Dr Nadara EatonGangi recommends Holter monitor.  Patient HR drop to 30's he was asleep. Nurse wake him up, when he wake up and was more sleepy.  EKG obtain and telemetry strips reviewed with Dr Jacinto HalimGanji. Plan is to consult EP.  Evaluated by EP, no need for pacemaker.  Patient asymptomatic, Dr Jacinto HalimGanji reviewed telemetry. Ok to discharge patient, he will arrange holter monitor.   Hypoglycemia;  Cortisol level normal. Discussed with Dr Sharl MaKerr. We should still rule out adrenal insufficiency . Will order cosyntropin test  sulfonyl urea, insulin level, ce peptide. Insulin and C peptide probably not accurate by the time labs was drawn blood sugar was higher. Dr Sharl MaKerr agree to see patient in consultation. I appreciate his help./  Check cbg Q 4 hours.  Snack at bedtime.  TSH normal.  Adrenal insufficiency was rule out.  no further episode.   Acute encephalopathy;  Patient was more sleepy the morning of 8-14. Mentation fluctuates.  He was more alert this afternoon on my second round.  EEG done on admission, was negative for seizure like  activities.  B 12 low normal. Started  supplement.  RPR ordered. TSH normal. Ammonia negative. ABG hypoxemia. placed on 2 L oxygen.  Neurology consulted.  -Started on Keppra due to concern for prior seizure like activities.  8-17; Call by nurse, that patient HR decreased to 32. He was asleep. She wake him up and he was hard to wake up, but subsequently he  wake up. He was lethargic. He would keep his eyes close. He would answer questions.  -Patient was place on Oxygen. BP was normal 130. He subsequently became more alert, confuse but conversant, speaking full sentences, following command. HR remain highs 40 and 50.  -Discussed case with neurologist who recommend EEG. -Discussed case with Dr Jacinto Halim,  EP consulted.  -Dr Graciela Husbands think episodes are related to orthostatic hypotension. Neurally mediated. No need for pacemaker.   EEG pending. If negative plan to discharge patient.   PNA; per daughter patient has been coughing. She is worry that patient didn't finished antibiotics.  -repeated chest x ray; showed atelectasis vs PNA/  -start empirically antibiotics due to cough, and encephalopathy.  -Day 4 IV antibiotics. Change to oral antibiotics. discharge on 2 more days of Augmentin.  History of CVA;  Continue with plavix and statin.   Moderate to severe aortic stenosis. Cardiology recommended conservative management with repeat echo 6 months.   Orthostatic hypotension;  Related to dehydration.  Hold BP medications.  Started on midodrine by dr Nadara Eaton. Nurse called and clarify order with Dr Nadara Eaton. Midodrine to be use TID during daylight, when patient is sitting up and standing.  abdominal binder.   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   Discharge Diagnoses:  Principal Problem:   Syncope and collapse Active Problems:   Essential  hypertension   Dementia   Nonrheumatic aortic valve stenosis   Hypotension   Wandering atrial pacemaker   Aortic stenosis, moderate   Orthostatic hypotension   Syncope    Discharge Instructions   Allergies as of 01/20/2018      Reactions   Xarelto [rivaroxaban] Other (See Comments)   Per patients family, there is currently a lawsuit against xarelto in pt's name. Bleeding in the bowels      Medication List    STOP taking these medications   amLODipine 5 MG tablet Commonly known as:  NORVASC   aspirin EC 81 MG tablet   hydrochlorothiazide 25 MG tablet Commonly known as:  HYDRODIURIL   LEVAQUIN 750 MG tablet Generic drug:  levofloxacin   NAMENDA 10 MG tablet Generic drug:  memantine   risperiDONE 1 MG tablet Commonly known as:  RISPERDAL   sertraline 25 MG tablet Commonly known as:  ZOLOFT   timolol 0.5 % ophthalmic solution Commonly known as:  BETIMOL   traZODone 50 MG tablet Commonly known as:  DESYREL     TAKE these medications   acetaminophen 325 MG tablet Commonly known as:  TYLENOL Take 2 tablets (650 mg total) by mouth every 6 (six) hours as needed for mild pain (or Fever >/= 101).   amoxicillin-clavulanate 875-125 MG tablet Commonly known as:  AUGMENTIN Take 1 tablet  by mouth every 12 (twelve) hours.   atorvastatin 40 MG tablet Commonly known as:  LIPITOR Take 1 tablet (40 mg total) by mouth daily at 6 PM. What changed:  when to take this   brimonidine 0.2 % ophthalmic solution Commonly known as:  ALPHAGAN Place 1 drop into both eyes 2 (two) times daily.   clopidogrel 75 MG tablet Commonly known as:  PLAVIX Take 1 tablet (75 mg total) by mouth daily.   cyanocobalamin 100 MCG tablet Take 1 tablet (100 mcg total) by mouth daily. Start taking on:  01/21/2018   feeding supplement (ENSURE ENLIVE) Liqd Take 237 mLs by mouth 3 (three) times daily between meals.   gabapentin 100 MG capsule Commonly known as:  NEURONTIN Take 1 capsule (100  mg total) by mouth at bedtime. What changed:  when to take this   levETIRAcetam 500 MG tablet Commonly known as:  KEPPRA Take 1 tablet (500 mg total) by mouth 2 (two) times daily.   midodrine 10 MG tablet Commonly known as:  PROAMATINE Take 1 tablet (10 mg total) by mouth 3 (three) times daily with meals. Use Midodrine during daylight only , and if patient awake sitting  and standing only.       Contact information for follow-up providers     NEUROLOGY Follow up in 1 week(s).   Contact information: [address] [place], [address] [place] [address] [phone]       Talmage Coin, MD Follow up in 1 week(s).   Specialty:  Endocrinology Contact information: [address] [place] [address] [phone]            Contact information for after-discharge care    Destination    [facility] .   Service:  Skilled Nursing Contact information: [address] [place] [address] [phone]                 Allergies  Allergen Reactions  . Xarelto [Rivaroxaban] Other (See Comments)    Per patients family, there is currently a lawsuit against xarelto in pt's name. Bleeding in the bowels    Consultations:  Cardiology   EP    Procedures/Studies: Dg Chest 2 View  Result Date: 01/16/2018 CLINICAL DATA:  Patient diagnosed with atrial fibrillation 01/08/2018. Bradycardia and syncope. EXAM: CHEST - 2 VIEW COMPARISON:  Single-view of the chest 01/11/2018. PA and lateral chest 12/04/2017. FINDINGS: The left hemidiaphragm is obscured consistent with left basilar airspace disease. The right lung is clear. No pneumothorax or pleural effusion. Heart size is mildly enlarged. Aortic atherosclerosis is noted. Degenerative disease about the right shoulder in thoracic spine. No acute or focal bony abnormality. IMPRESSION: Left basilar airspace disease which could be atelectasis or pneumonia.  Electronically Signed   By: Drusilla Kanner M.D.   On: 01/16/2018 10:46   Ct Head Wo Contrast  Result Date: 01/11/2018 CLINICAL DATA:  Syncopal episode while getting out of car today. EXAM: CT HEAD WITHOUT CONTRAST TECHNIQUE: Contiguous axial images were obtained from the base of the skull through the vertex without intravenous contrast. COMPARISON:  December 04, 2017 FINDINGS: Brain: No evidence of acute infarction, hemorrhage, hydrocephalus, extra-axial collection or mass lesion/mass effect. There is chronic diffuse atrophy. Chronic bilateral periventricular white matter small vessel ischemic changes identified. Vascular: No hyperdense vessel or unexpected calcification. Skull: Normal. Negative for fracture or focal lesion. Sinuses/Orbits: No acute finding. Mild mucoperiosteal thickening of bilateral ethmoid sinuses are noted. Other: None. IMPRESSION:  No focal acute intracranial abnormality identified. Chronic diffuse atrophy and chronic bilateral periventricular white matter small vessel ischemic change. Electronically Signed   By: Sherian Rein M.D.   On: 01/11/2018 19:15   Dg Chest Port 1 View  Result Date: 01/11/2018 CLINICAL DATA:  Syncope and vomiting today. EXAM: PORTABLE CHEST 1 VIEW COMPARISON:  December 04, 2017 FINDINGS: The heart size and mediastinal contours are within normal limits. Both lungs are clear. The visualized skeletal structures are stable. IMPRESSION: No active cardiopulmonary disease. Electronically Signed   By: Sherian Rein M.D.   On: 01/11/2018 17:08   Dg Swallowing Func-speech Pathology  Result Date: 01/20/2018 Objective Swallowing Evaluation: Type of Study: MBS-Modified Barium Swallow Study  Patient Details Name: Dru Primeau MRN: [id number] Date of Birth: [birth year] Today's Date: 01/20/2018 Time: SLP Start Time (ACUTE ONLY): 9562 -SLP Stop Time (ACUTE ONLY): 0939 SLP Time Calculation (min) (ACUTE ONLY): 11 min Past Medical History: Past Medical History: Diagnosis Date .  A-fib (HCC)  . AS (aortic stenosis)  . Blind right eye  . Bradycardia  . Chicken pox  . Dementia  . Frequent urinary tract infections  . Glaucoma  . Hypertension  . PNA (pneumonia)  . Stroke [facility])   5 years ago . Syncope  Past Surgical History: Past Surgical History: Procedure Laterality Date . EYE SURGERY   HPI: Jacinto Keil is a 84 y.o. male with medical history significant of dementia, aortic stenosis hypertension presents with syncope. Per chart patient was treated for pneumonia in [place] earlier this month and has 2 more days on Levaquin.  CXR 8/15 Left basilar airspace disease which could be atelectasis or  Subjective: pt alert, pleasant mood, needs cues Assessment / Plan / Recommendation CHL IP CLINICAL IMPRESSIONS 01/20/2018 Clinical Impression Oropharyngeal swallow function found to be within normal limits within the confines of this MBS. Mastication of solid texture functional with mild challenge manipulating/coordinating dual consistencies of pill and liquid and needed multiple sips to assist pill transfer to posterior oral cavity. Swallow initiation timely with normal amount of post lingual residue falling to vallecuale, sensed, swallowed and cleared. Hyolaryngeal excursion, laryngeal elevation and epiglottic deflection normal to prevent penetration into laryngeal vestibule with any consistency. Esophageal scan without abnormalities however MBS only diagnoses the UES. Pt to continue regular texture, thin liquids, straws allowed and may consider pills with puree if difficulty transiting larger pills with thin. No ST follow up recommended at this time.  SLP Visit Diagnosis Dysphagia, unspecified (R13.10) Attention and concentration deficit following -- Frontal lobe and executive function deficit following -- Impact on safety and function Mild aspiration risk   CHL IP TREATMENT RECOMMENDATION 01/20/2018 Treatment Recommendations No treatment recommended at this time   No flowsheet data found. CHL  IP DIET RECOMMENDATION 01/20/2018 SLP Diet Recommendations Regular solids;Thin liquid Liquid Administration via Straw;Cup Medication Administration Whole meds with liquid Compensations -- Postural Changes Seated upright at 90 degrees   CHL IP OTHER RECOMMENDATIONS 01/20/2018 Recommended Consults -- Oral Care Recommendations Oral care BID Other Recommendations --   CHL IP FOLLOW UP RECOMMENDATIONS 01/20/2018 Follow up Recommendations None   CHL IP FREQUENCY AND DURATION 12/05/2017 Speech Therapy Frequency (ACUTE ONLY) min 2x/week Treatment Duration 1 week      CHL IP ORAL PHASE 01/20/2018 Oral Phase WFL Oral - Pudding Teaspoon -- Oral - Pudding Cup -- Oral - Honey Teaspoon -- Oral - Honey Cup -- Oral - Nectar Teaspoon -- Oral - Nectar Cup -- Oral - Nectar Straw -- Oral -  Thin Teaspoon -- Oral - Thin Cup -- Oral - Thin Straw -- Oral - Puree -- Oral - Mech Soft -- Oral - Regular -- Oral - Multi-Consistency -- Oral - Pill -- Oral Phase - Comment --  CHL IP PHARYNGEAL PHASE 01/20/2018 Pharyngeal Phase WFL Pharyngeal- Pudding Teaspoon -- Pharyngeal -- Pharyngeal- Pudding Cup -- Pharyngeal -- Pharyngeal- Honey Teaspoon -- Pharyngeal -- Pharyngeal- Honey Cup -- Pharyngeal -- Pharyngeal- Nectar Teaspoon -- Pharyngeal -- Pharyngeal- Nectar Cup -- Pharyngeal -- Pharyngeal- Nectar Straw -- Pharyngeal -- Pharyngeal- Thin Teaspoon -- Pharyngeal -- Pharyngeal- Thin Cup -- Pharyngeal -- Pharyngeal- Thin Straw -- Pharyngeal -- Pharyngeal- Puree -- Pharyngeal -- Pharyngeal- Mechanical Soft -- Pharyngeal -- Pharyngeal- Regular -- Pharyngeal -- Pharyngeal- Multi-consistency -- Pharyngeal -- Pharyngeal- Pill -- Pharyngeal -- Pharyngeal Comment --  CHL IP CERVICAL ESOPHAGEAL PHASE 01/20/2018 Cervical Esophageal Phase WFL Pudding Teaspoon -- Pudding Cup -- Honey Teaspoon -- Honey Cup -- Nectar Teaspoon -- Nectar Cup -- Nectar Straw -- Thin Teaspoon -- Thin Cup -- Thin Straw -- Puree -- Mechanical Soft -- Regular -- Multi-consistency -- Pill --  Cervical Esophageal Comment -- Royce Macadamia 01/20/2018, 10:07 AM  Breck Coons Lonell Face.Ed CCC-SLP Pager [phone]                Subjective: Alert in no distress.  Conversant, eating well.   Discharge Exam: Vitals:   01/20/18 1224 01/20/18 1224  BP: (!) 158/65 (!) 158/65  Pulse:  (!) 44  Resp:    Temp:    SpO2:  98%   Vitals:   01/20/18 0436 01/20/18 1200 01/20/18 1224 01/20/18 1224  BP: (!) 146/74 (!) 171/69 (!) 158/65 (!) 158/65  Pulse: 77 (!) 47  (!) 44  Resp: 16     Temp: 98.5 F (36.9 C) 98.5 F (36.9 C)    TempSrc: Oral Oral    SpO2: 98% 99%  98%  Weight:      Height:        General: Pt is alert, awake, not in acute distress Cardiovascular: RRR, S1/S2 +, no rubs, no gallops Respiratory: CTA bilaterally, no wheezing, no rhonchi Abdominal: Soft, NT, ND, bowel sounds + Extremities: no edema, no cyanosis    The results of significant diagnostics from this hospitalization (including imaging, microbiology, ancillary and laboratory) are listed below for reference.     Microbiology: No results found for this or any previous visit (from the past 240 hour(s)).   Labs: BNP (last 3 results) Recent Labs    12/04/17 2321  BNP 172.2*   Basic Metabolic Panel: Recent Labs  Lab 01/16/18 0911 01/17/18 0452 01/18/18 0538 01/18/18 0821 01/18/18 0930 01/18/18 1614 01/20/18 1422  NA 139 140  --  139  --  139 141  K 3.8 3.2*  --  3.8  --  3.7 3.6  CL 106 109  --  109  --  106 108  CO2 25 26  --  26  --  25 27  GLUCOSE 138* 107* 79 74  --  119* 102*  BUN 7* 9  --  9  --  7* 9  CREATININE 0.88 0.81  --  0.85 0.84 0.78 0.80  CALCIUM 8.7* 8.0*  --  8.0*  --  8.4* 8.3*  MG  --  1.9  --   --   --  2.0  --    Liver Function Tests: No results for input(s): AST, ALT, ALKPHOS, BILITOT, PROT, ALBUMIN in the last 168 hours. No results  for input(s): LIPASE, AMYLASE in the last 168 hours. Recent Labs  Lab 01/15/18 0948  AMMONIA 32   CBC: Recent Labs  Lab  01/15/18 0948 01/16/18 0959 01/17/18 0452  WBC 4.5 5.1 5.4  HGB 11.1* 12.3* 11.2*  HCT 35.5* 38.5* 35.1*  MCV 88.5 88.5 88.4  PLT 160 173 164   Cardiac Enzymes: No results for input(s): CKTOTAL, CKMB, CKMBINDEX, TROPONINI in the last 168 hours. BNP: Invalid input(s): POCBNP CBG: Recent Labs  Lab 01/19/18 2104 01/19/18 2320 01/20/18 0421 01/20/18 0745 01/20/18 1156  GLUCAP 104* 89 76 77 98   D-Dimer No results for input(s): DDIMER in the last 72 hours. Hgb A1c No results for input(s): HGBA1C in the last 72 hours. Lipid Profile No results for input(s): CHOL, HDL, LDLCALC, TRIG, CHOLHDL, LDLDIRECT in the last 72 hours. Thyroid function studies No results for input(s): TSH, T4TOTAL, T3FREE, THYROIDAB in the last 72 hours.  Invalid input(s): FREET3 Anemia work up No results for input(s): VITAMINB12, FOLATE, FERRITIN, TIBC, IRON, RETICCTPCT in the last 72 hours. Urinalysis    Component Value Date/Time   COLORURINE YELLOW 01/11/2018 1730   APPEARANCEUR CLEAR 01/11/2018 1730   LABSPEC 1.005 01/11/2018 1730   PHURINE 7.0 01/11/2018 1730   GLUCOSEU NEGATIVE 01/11/2018 1730   HGBUR NEGATIVE 01/11/2018 1730   BILIRUBINUR NEGATIVE 01/11/2018 1730   KETONESUR NEGATIVE 01/11/2018 1730   PROTEINUR NEGATIVE 01/11/2018 1730   NITRITE NEGATIVE 01/11/2018 1730   LEUKOCYTESUR NEGATIVE 01/11/2018 1730   Sepsis Labs Invalid input(s): PROCALCITONIN,  WBC,  LACTICIDVEN Microbiology No results found for this or any previous visit (from the past 240 hour(s)).   Time coordinating discharge: 35 minutes.   SIGNED:   Alba CoryBelkys A Sonny Poth, MD  [facility] 01/20/2018, 3:30 PM Pager   If 7PM-7AM, please contact night-coverage [email or web address] Password [email or web address]

## 2018-01-20 NOTE — Procedures (Signed)
Date of recording 01/20/2018  Referring physician Regalado  Reason for the study syncope  Technical Digital EEG recording using 10-20 international electrode system  Description of the recording Posterior dominant rhythm is 8-9Hz  symmetrical reactive Mild slowing during drowsiness. Non-REM stage II sleep was not seen Epileptiform features were not seen during this recording.  Impression  The EEG is normal in awake and drowsy states

## 2018-01-20 NOTE — Progress Notes (Signed)
Modified Barium Swallow Progress Note  Patient Details  Name: Dan Stone MRN: [id number] Date of Birth: [birth year]  Today's Date: 01/20/2018  Modified Barium Swallow completed.  Full report located under Chart Review in the Imaging Section.  Brief recommendations include the following:  Clinical Impression  Oropharyngeal swallow function found to be within normal limits within the confines of this MBS. Mastication of solid texture functional with mild challenge manipulating/coordinating dual consistencies of pill and liquid and needed multiple sips to assist pill transfer to posterior oral cavity. Swallow initiation timely with normal amount of post lingual residue falling to vallecuale, sensed, swallowed and cleared. Hyolaryngeal excursion, laryngeal elevation and epiglottic deflection normal to prevent penetration into laryngeal vestibule with any consistency. Esophageal scan without abnormalities however MBS only diagnoses the UES. Pt to continue regular texture, thin liquids, straws allowed and may consider pills with puree if difficulty transiting larger pills with thin. No ST follow up recommended at this time.    Swallow Evaluation Recommendations       SLP Diet Recommendations: Regular solids;Thin liquid   Liquid Administration via: Straw;Cup   Medication Administration: Whole meds with liquid(or whole applesauce if difficulty)           Postural Changes: Seated upright at 90 degrees   Oral Care Recommendations: Oral care BID        Royce MacadamiaLitaker, Claude Waldman Willis 01/20/2018,10:07 AM   Breck CoonsLisa Willis Lonell FaceLitaker M.Ed [employer]-SLP Pager [phone]

## 2018-01-20 NOTE — Progress Notes (Addendum)
Patient's HR brady down to 28 (not sustained). Patient is asymptomatic however, HR is ranging from 35-43. Dr. Sunnie Nielsenegalado made aware. Dr. Nadara EatonGangi paged, awaiting response for further recommendations.   UPDATE @ 1245: Dr. Nadara EatonGangi called and states he will round on patient.

## 2018-01-20 NOTE — Plan of Care (Signed)
  Problem: Health Behavior/Discharge Planning: Goal: Ability to manage health-related needs will improve Outcome: Not Progressing   Problem: Clinical Measurements: Goal: Ability to maintain clinical measurements within normal limits will improve Outcome: Not Progressing Goal: Will remain free from infection Outcome: Progressing   Problem: Activity: Goal: Risk for activity intolerance will decrease Outcome: Not Progressing   Problem: Coping: Goal: Level of anxiety will decrease Outcome: Progressing   Problem: Safety: Goal: Ability to remain free from injury will improve Outcome: Not Progressing Note:  Patient still pulling at lines

## 2018-01-20 NOTE — Clinical Social Work Note (Signed)
CSW facilitated patient discharge including contacting patient family and facility to confirm patient discharge plans. Clinical information faxed to facility and family agreeable with plan. RN will call transport to [facility] via [facility] once discharge order is in. EEG results pending and will determine if patient discharges today. Daughter is aware. RN to call report prior to discharge [phone] Room 312).  To set up [facility]: Call [phone]. Provide room number, name, date of birth (Jan 14, 1934), SS# [id number], no oxygen, weight 213 lbs.  If no answer, call [phone], option 1, option 3.  Please notify patient's daughter if/when transport is arranged.  CSW will sign off for now as social work intervention is no longer needed. Please consult us again if new needs arise.  Charlynn CourtSarah Jaimeson Gopal, CSW [phone]

## 2018-01-20 NOTE — Care Management Important Message (Signed)
Important Message  Patient Details  Name: Dan Stone MRN: [id number] Date of Birth: 27-Jun-1933   Medicare Important Message Given:  Yes    Analeia Ismael P Kilee Hedding 01/20/2018, 1:50 PM

## 2018-01-20 NOTE — Clinical Social Work Placement (Signed)
   CLINICAL SOCIAL WORK PLACEMENT  NOTE  Date:  01/20/2018  Patient Details  Name: Dan Stone MRN: [id number] Date of Birth: 1933/11/18  Clinical Social Work is seeking post-discharge placement for this patient at the Skilled  Nursing Facility level of care (*CSW will initial, date and re-position this form in  chart as items are completed):      Patient/family provided with [facility] Clinical Social Work Department's list of facilities offering this level of care within the geographic area requested by the patient (or if unable, by the patient's family).      Patient/family informed of their freedom to choose among providers that offer the needed level of care, that participate in Medicare, Medicaid or managed care program needed by the patient, have an available bed and are willing to accept the patient.      Patient/family informed of [facility]'s ownership interest in [facility] and [facility], as well as of the fact that they are under no obligation to receive care at these facilities.  PASRR submitted to EDS on 01/17/18     PASRR number received on       Existing PASRR number confirmed on 01/17/18     FL2 transmitted to all facilities in geographic area requested by pt/family on 01/17/18     FL2 transmitted to all facilities within larger geographic area on       Patient informed that his/her managed care company has contracts with or will negotiate with certain facilities, including the following:        Yes   Patient/family informed of bed offers received.  Patient [facility] bed at [facility]     Physician recommends and patient chooses bed at      Patient to be [facility] on 01/20/18.  Patient to be transferred to facility by [facility]     Patient family notified on 01/20/18 of transfer.  Name of family member notified:  Sherren Kernsonita Barnes     PHYSICIAN       Additional Comment:     _______________________________________________ Margarito LinerSarah C Jessup Ogas, LCSW 01/20/2018, 4:39 PM

## 2018-01-20 NOTE — Progress Notes (Signed)
Physical Therapy Treatment Patient Details Name: Dan AlandJames Arthur Stone MRN: [id number] DOB: 1933/10/17 Today's Date: 01/20/2018    History of Present Illness 84 year old [place] severe [place] stenosis, dementia, stroke, atrial fibrillation presented with history of syncope outside, while wearing warm clothing. Reportedly had systolic blood pressures in the 60s per EMS. Patient confused and vomited after syncopal episode. Recently treated for pneumonia and [name], still on Levaquin.    PT Comments    Pt has had bradycardia today with HR as low as 28 bpm (unsustained). RN requested bed level exercise today. Pt agreeable and completed AROM exercise on R side and AAROM exercise on L sided due to residual weakness from prior CVA. HR during exercise ranged from 38-55 bpm. D/c plan remains appropriate. PT will continue to follow.     Follow Up Recommendations  SNF;Supervision/Assistance - 24 hour     Equipment Recommendations  (TBD)    Recommendations for Other Services       Precautions / Restrictions Precautions Precautions: Fall Precaution Comments: history of dementia Restrictions Weight Bearing Restrictions: No          Cognition Arousal/Alertness: Awake/alert Behavior During Therapy: WFL for tasks assessed/performed Overall Cognitive Status: No family/caregiver present to determine baseline cognitive functioning Area of Impairment: Orientation;Attention;Memory;Following commands;Safety/judgement;Awareness;Problem solving                 Orientation Level: Disoriented to;Place;Time;Situation Current Attention Level: Sustained Memory: Decreased recall of precautions;Decreased short-term memory Following Commands: Follows one step commands inconsistently;Follows one step commands with increased time Safety/Judgement: Decreased awareness of safety;Decreased awareness of deficits Awareness: Intellectual Problem Solving: Slow  processing;Decreased initiation;Difficulty sequencing;Requires verbal cues;Requires tactile cues General Comments: unsure of baseline, no family available      Exercises General Exercises - Upper Extremity Shoulder Flexion: AROM;Right;AAROM;Left;10 reps;Supine Elbow Flexion: AROM;Right;AAROM;Left;10 reps;Supine Elbow Extension: AROM;Right;AAROM;Left;10 reps;Supine Digit Composite Flexion: AROM;Both;10 reps;Supine General Exercises - Lower Extremity Ankle Circles/Pumps: AROM;Right;AAROM;Left;10 reps;Supine Quad Sets: AROM;Right;AAROM;Left;10 reps;Supine Short Arc Quad: AROM;Right;AAROM;Left;10 reps;Supine Hip ABduction/ADduction: AROM;Right;AAROM;Left;10 reps;Supine Straight Leg Raises: AROM;Right;AAROM;Left;10 reps;Supine    General Comments General comments (skin integrity, edema, etc.): HR ranged from 38-55 bpm during bed exercises      Pertinent Vitals/Pain Pain Assessment: No/denies pain           PT Goals (current goals can now be found in the care plan section) Acute Rehab PT Goals Patient Stated Goal: Pt did not state but agreeable to work with therapy. PT Goal Formulation: With patient Time For Goal Achievement: 01/29/18 Potential to Achieve Goals: Good Progress towards PT goals: Not progressing toward goals - comment(limited by decreased HR)    Frequency    Min 3X/week      PT Plan Current plan remains appropriate       AM-PAC PT "6 Clicks" Daily Activity  Outcome Measure  Difficulty turning over in bed (including adjusting bedclothes, sheets and blankets)?: Unable Difficulty moving from lying on back to sitting on the side of the bed? : Unable Difficulty sitting down on and standing up from a chair with arms (e.g., wheelchair, bedside commode, etc,.)?: Unable Help needed moving to and from a bed to chair (including a wheelchair)?: A Lot Help needed walking in hospital room?: A Lot Help needed climbing 3-5 steps with a railing? : Total 6 Click Score:  8    End of Session Equipment Utilized During Treatment: Gait belt Activity Tolerance: Treatment limited secondary to medical complications (Comment)(Bradycardia) Patient left: in bed;with call bell/phone within reach Nurse  Communication: Mobility status PT Visit Diagnosis: Unsteadiness on feet (R26.81);Muscle weakness (generalized) (M62.81);Difficulty in walking, not elsewhere classified (R26.2);Hemiplegia and hemiparesis Hemiplegia - Right/Left: Left Hemiplegia - caused by: Cerebral infarction     Time: [id number] PT Time Calculation (min) (ACUTE ONLY): 20 min  Charges:  $Therapeutic Exercise: 8-22 mins                     Audwin Semper B. Beverely RisenVan Stone PT, DPT Acute Rehabilitation  [phone] Pager [phone]     Dan Stone 01/20/2018, 3:54 PM

## 2018-01-21 ENCOUNTER — Non-Acute Institutional Stay (SKILLED_NURSING_FACILITY): Payer: Medicare Other | Admitting: Internal Medicine

## 2018-01-21 ENCOUNTER — Encounter: Payer: Self-pay | Admitting: Internal Medicine

## 2018-01-21 DIAGNOSIS — I6930 Unspecified sequelae of cerebral infarction: Secondary | ICD-10-CM

## 2018-01-21 DIAGNOSIS — I498 Other specified cardiac arrhythmias: Secondary | ICD-10-CM | POA: Diagnosis not present

## 2018-01-21 DIAGNOSIS — F0150 Vascular dementia without behavioral disturbance: Secondary | ICD-10-CM

## 2018-01-21 DIAGNOSIS — I951 Orthostatic hypotension: Secondary | ICD-10-CM | POA: Diagnosis not present

## 2018-01-21 NOTE — Progress Notes (Signed)
NURSING HOME LOCATION:  [facility] ROOM NUMBER:  [id number]  CODE STATUS:  Full Code  PCP:  Deeann SaintBanks, Shannon R, MD  [address] [place] [id number]  This is a comprehensive admission note to [facility] performed on this date less than 30 days from date of admission.  Included are preadmission medical/surgical history; reconciled medication list; family history; social history and comprehensive review of systems.  Corrections and additions to the records were documented. Comprehensive physical exam was also performed. Additionally a clinical summary was entered for each active diagnosis pertinent to this admission in the Problem List to enhance continuity of care.  HPI: Patient was hospitalized 8/10-8/19/2019 admitted with syncope in context of hypotension. Apparently he was outside in the heat when he fainted, EMS documented blood pressures in the 60s.  The patient was acutely confused ,superimposed on a background of dementia .He vomited after the syncopal episode  Apparently [place] live with his daughter and was on Levaquin for pneumonia.  Chest x-ray was negative for pneumonia.  Follow-up x-ray revealed some atelectasis versus pneumonia.  Because of persistent cough & Xray changes antibiotics were reinitiated & transitioned to oral antibiotics. At discharge he was to continue Augmentin for 2 days. While hospitalized on 8/11 he had an unresponsive episode.  EEG was negative for seizure.  Syncope was attributed to orthostatic hypotension; blood pressure medicines were discontinued.Midodrine and abdominal binder were initiated for hypotension.  Cardiology consulted for 2:1AV block, heart rate dropped into the 30s when he was asleep.EP also felt the episodes were related to orthostatic hypotension with neural mediation; pacemaker was not recommended.  Holter monitor was recommended. He was noted to be hypoxemic and submental oxygen was  initiated.   Supplements were started for low B12. Neurology initiated Keppra because of concern for prior seizure-like activity.   As per the discharge summary the patient's family is suing the makers of Xarelto because of history of bleeding in the bowels. CBC revealed Hgb 11.2  and hematocrit 35.1 with normochromic normocytic indices.  ACTH stimulation test revealed a basal cortisol of 5.5 ;value of 20.3 at 30 minutes ( abnormal is < 18 according to Up to Date) and 21.1 at 60 minutes. Past medical and surgical history: Includes essential hypertension, wandering atrial pacemaker, nonrheumatic moderate aortic stenosis,& history of CVA.  Surgeries include "eye surgery" without additional description.  Social history: Never smoked. No hx of alcohol use  Family history: Limited history reviewed   Review of systems: He can give no meaningful history.  He initially stated he was cold from the fan.  His conversations were nonsensical.  He mentioned "a charge for using the seat".  I asked him to define what he meant he mentioned "flexigenity" PT/OT & Nursing staff committed some postural change in blood pressure.  When he was walked to the bathroom pulse went into the 130s.  Pressure was 150/101.  The most recent postural blood pressures actually showed values of 94/70 supine, 132/65 sitting and blood pressure 122/79 standing.  Physical exam:  Pertinent or positive findings: He is unshaven.  He has slight ptosis on the of the right eye and slight exotropia of the right eye.  Multiple missing teeth.  Heart rate is irregular with a grade 1 systolic murmur at the right base.  The second heart sound is increased. Breath sounds are decreased. Abdomen is protuberant. Trace edema.  Foley catheter is in place.  Right upper extremity is weaker  than the left.  General appearance: Adequately nourished; no acute distress, increased work of breathing is present.   Lymphatic: No lymphadenopathy about the head, neck,  axilla. Eyes: No conjunctival inflammation or lid edema is present. There is no scleral icterus. Ears:  External ear exam shows no significant lesions or deformities.   Nose:  External nasal examination shows no deformity or inflammation. Nasal mucosa are pink and moist without lesions, exudates Oral exam: Lips and gums are healthy appearing.There is no oropharyngeal erythema or exudate. Neck:  No thyromegaly, masses, tenderness noted.    Heart:  No gallop, click, rub.  Lungs:  without wheezes, rhonchi, rales, rubs. Abdomen: Bowel sounds are normal.  Abdomen is soft and nontender with no organomegaly, hernias, masses. GU: Deferred  Extremities:  No cyanosis, clubbing. Neurologic exam:   Balance, Rhomberg, finger to nose testing could not be completed due to clinical state Skin: Warm & dry w/o tenting. No significant lesions or rash.  See clinical summary under each active problem in the Problem List with associated updated therapeutic plan'

## 2018-01-21 NOTE — Assessment & Plan Note (Signed)
01/21/18 SLUMS score 10/30 (previous score 14 out of 30) SLUMS is a mental status assessment of possible dementia published by the [employer] [facility] [employer]. The test differentiates between high school or less education level in reference to presence or absence of dementia. For high school education a score of 27-30 is normal, 21-26 is minimal neurocognitive deficit and 1-22 suggests dementia. With less than a high school education similar scoring is 25-30, 20-24, and 1-19. He has English degree

## 2018-01-21 NOTE — Assessment & Plan Note (Addendum)
See 01/21/18 elevated blood pressure Midodrine will be decreased this can cause severe supine hypertension & urinary retention Staff asked to locate abdominal binder prescribed @ [facility] TED hose ACTH stimulation results will be reviewed with Endocrinologist as Up to Date data was not definitive for me

## 2018-01-21 NOTE — Plan of Care (Signed)
  Problem: Clinical Measurements: Goal: Diagnostic test results will improve Outcome: Progressing   Problem: Clinical Measurements: Goal: Respiratory complications will improve Outcome: Progressing   

## 2018-01-22 NOTE — Telephone Encounter (Signed)
Spoke with Menda states that she did not start Speech Therapy on pt since he was admited back to the hospital,Menda stated that she will call the office if pt requires any therapy 

## 2018-01-22 NOTE — Assessment & Plan Note (Signed)
Pursue Holter monitor through Dr Verl DickerGanji's office

## 2018-01-22 NOTE — Assessment & Plan Note (Signed)
Discussed with daughter; Neurology records from [place] needed If possible referral to Neurologist in [place] by his prior Neurologist optimal

## 2018-01-22 NOTE — Telephone Encounter (Signed)
Spoke with Menda states that she did not start Speech Therapy on pt since he was admited back to the hospital,Menda stated that she will call the office if pt requires any therapy

## 2018-01-22 NOTE — Patient Instructions (Addendum)
See assessment and plan under each diagnosis in the problem list and acutely for this visit Total time 48  minutes; greater than 50% of the visit spent counseling patient's daughter and coordinating care for problems addressed at this encounter

## 2018-01-23 LAB — SULFONYLUREA HYPOGLYCEMICS PANEL, SERUM
ACETOHEXAMIDE: NEGATIVE ug/mL (ref 20–60)
Chlorpropamide: NEGATIVE ug/mL (ref 75–250)
GLIMEPIRIDE: NEGATIVE ng/mL (ref 80–250)
GLIPIZIDE: NEGATIVE ng/mL (ref 200–1000)
Glyburide: NEGATIVE ng/mL
NATEGLINIDE: NEGATIVE ng/mL
REPAGLINIDE: NEGATIVE ng/mL
Tolazamide: NEGATIVE ug/mL
Tolbutamide: NEGATIVE ug/mL (ref 40–100)

## 2018-01-26 ENCOUNTER — Other Ambulatory Visit: Payer: Self-pay | Admitting: Internal Medicine

## 2018-01-27 ENCOUNTER — Other Ambulatory Visit: Payer: Self-pay | Admitting: Internal Medicine

## 2018-01-30 ENCOUNTER — Non-Acute Institutional Stay (SKILLED_NURSING_FACILITY): Payer: Medicare Other | Admitting: Internal Medicine

## 2018-01-30 ENCOUNTER — Encounter: Payer: Self-pay | Admitting: Internal Medicine

## 2018-01-30 DIAGNOSIS — I10 Essential (primary) hypertension: Secondary | ICD-10-CM | POA: Diagnosis not present

## 2018-01-30 DIAGNOSIS — W19XXXA Unspecified fall, initial encounter: Secondary | ICD-10-CM

## 2018-01-30 DIAGNOSIS — R609 Edema, unspecified: Secondary | ICD-10-CM | POA: Diagnosis not present

## 2018-01-30 NOTE — Progress Notes (Signed)
    NURSING HOME LOCATION:  [facility] ROOM NUMBER:  [id number]  CODE STATUS:  Full Code  PCP:  Deeann SaintBanks, Shannon R, MD  [address] [place] [id number]  This is a nursing facility follow up for specific acute issues of fall and weight gain  Interim medical record and care since last [facility] visit was updated with review of diagnostic studies and change in clinical status since last visit were documented.  HPI: Staff reports he was found on the floor with no apparent injury or change in mental status.  He did not realize he had fallen when I interviewed him & can give no history related to the fall.  He certainly cannot define whether he had any cardiac or neurologic prodrome prior to the fall. He has been on Midodrine as well as an abdominal under because of postural hypotension & falls/syncope. Previously blood pressures had been  below100 systolic and below 70 diastolic, but serially the blood pressures have risen.  Over the last 6 days the lowest blood pressure has been 114/75.  Pressures are trending up and recorded as high as 156/84 until today's value of 175/77. Nutrition reports an increase weight gain of 3.58% ;up to 214 pounds.  He is not on a diuretic because of the prior postural hypotension. Review of the chart indicates his ejection fraction is been 60-65% on echocardiogram.  Review of systems: Dementia invalidated responses. Date given as April 06, 2017.  He denied any cardiopulmonary symptoms.  Constitutional: No fever, significant weight change, fatigue  Cardiovascular: No chest pain, palpitations, paroxysmal nocturnal dyspnea, claudication, edema  Respiratory: No cough, sputum production, hemoptysis, DOE, significant snoring, apnea   Musculoskeletal: No joint stiffness, joint swelling, weakness, pain Neurologic: No dizziness, headache, syncope, seizures, numbness, tingling    Physical exam:  Pertinent or positive findings: Slight ptosis of the  right eye.  He has upper and lower partials.  Grade 1 systolic murmur is present, rhythm is slightly irregular.  Breath sounds are decreased.  Abdomen is protuberant; binder in place.  The left upper extremity is weaker than the right and he has decreased range of motion.  Pedal pulses are decreased.  He has tense edema of the lower extremities.Antiwandering bracelet @ R ankle.  General appearance: Adequately nourished; no acute distress, increased work of breathing is present.   Lymphatic: No lymphadenopathy about the head, neck, axilla. Eyes: No conjunctival inflammation or lid edema is present. There is no scleral icterus. Ears:  External ear exam shows no significant lesions or deformities.   Nose:  External nasal examination shows no deformity or inflammation. Nasal mucosa are pink and moist without lesions, exudates Oral exam:  Lips and gums are healthy appearing. There is no oropharyngeal erythema or exudate. Neck:  No thyromegaly, masses, tenderness noted.    Heart:  No gallop, click, rub .  Lungs:  without wheezes, rhonchi, rales, rubs. Abdomen: Bowel sounds are normal. Abdomen is soft and nontender with no organomegaly, hernias, masses. GU: Deferred  Extremities:  No cyanosis, clubbing  Neurologic exam : Balance, Rhomberg, finger to nose testing could not be completed due to clinical state Skin: Warm & dry w/o tenting. No significant lesions or rash.  See summary under each active problem in the Problem List with associated updated therapeutic plan

## 2018-01-30 NOTE — Patient Instructions (Signed)
See assessment and plan under each diagnosis in the problem list and acutely for this visit 

## 2018-01-30 NOTE — Assessment & Plan Note (Addendum)
In association with a 3.58% weight gain (214 pounds) Restart low-dose furosemide and monitor

## 2018-01-30 NOTE — Assessment & Plan Note (Addendum)
Serial blood pressures have progressively increased and systolic was 175 this morning D/C Midodrine

## 2018-02-05 ENCOUNTER — Non-Acute Institutional Stay (SKILLED_NURSING_FACILITY): Payer: Medicare Other | Admitting: Adult Health

## 2018-02-05 ENCOUNTER — Encounter: Payer: Self-pay | Admitting: Adult Health

## 2018-02-05 DIAGNOSIS — G629 Polyneuropathy, unspecified: Secondary | ICD-10-CM

## 2018-02-05 DIAGNOSIS — I350 Nonrheumatic aortic (valve) stenosis: Secondary | ICD-10-CM | POA: Diagnosis not present

## 2018-02-05 DIAGNOSIS — Z9181 History of falling: Secondary | ICD-10-CM | POA: Diagnosis not present

## 2018-02-05 DIAGNOSIS — I6930 Unspecified sequelae of cerebral infarction: Secondary | ICD-10-CM | POA: Diagnosis not present

## 2018-02-05 DIAGNOSIS — G4700 Insomnia, unspecified: Secondary | ICD-10-CM

## 2018-02-05 DIAGNOSIS — R600 Localized edema: Secondary | ICD-10-CM

## 2018-02-05 NOTE — Progress Notes (Signed)
Location:  [facility] Room Number: 312-A Place of Service:  SNF (31) Provider:  Kenard Gower, NP  Patient Care Team: Deeann Saint, MD as PCP - General (Family Medicine)  Extended Emergency Contact Information Primary Emergency Contact: [place] Address: [address]          [place], [place] [address] [place] of [place] Home Phone: [phone] Mobile Phone: [phone] Relation: Daughter  Code Status:  Full Code  Goals of care: Advanced Directive information Advanced Directives 01/11/2018  Does Patient Have a Medical Advance Directive? Yes  Type of Advance Directive Healthcare Power of Attorney  Does patient want to make changes to medical advance directive? No - Patient declined  Copy of Healthcare Power of Attorney in Chart? Yes  Would patient like information on creating a medical advance directive? No - Patient declined     Chief Complaint  Patient presents with  . Acute Visit    Patient had a fall without apparent injury.      HPI:  Pt is an 84 y.o. male seen today for acute visit due to an unwitnessed fall in his room without apparent injury. He was seen in his room today and did not complain of any pain. He was not able to tell me what happened. He was able to move all his extremities without difficulty.  He has been admitted to [facility] on 01/20/18 from the hospital with syncope and hypotension. Syncope was thought to be related to orthostatic hypotension. Chest x-ray showed atelectasis vs PNA. He was given IV antibiotics then discharged on oral antibiotics  (Augmentin) X 2 more days. He has a PMH of stroke, essential hypertension, and dementia.    Patient was admitted to this facility for short-term rehabilitation after the patient's recent hospitalization.     Past Medical History:  Diagnosis Date  . A-fib (HCC)   . AS (aortic stenosis)   . Blind right eye   . Bradycardia   . Chicken pox   .  Dementia   . Frequent urinary tract infections   . Glaucoma   . Hypertension   . PNA (pneumonia)   . Stroke [facility])    5 years ago  . Syncope    Past Surgical History:  Procedure Laterality Date  . EYE SURGERY      Allergies  Allergen Reactions  . Xarelto [Rivaroxaban] Other (See Comments)    Per patients family, there is currently a lawsuit against xarelto in pt's name. Bleeding in the bowels    Outpatient Encounter Medications as of 02/05/2018  Medication Sig  . acetaminophen (TYLENOL) 325 MG tablet Take 2 tablets (650 mg total) by mouth every 6 (six) hours as needed for mild pain (or Fever >/= 101).  Marland Kitchen atorvastatin (LIPITOR) 40 MG tablet TAKE 1 TABLET (40 MG TOTAL) BY MOUTH DAILY AT 6 PM.  . brimonidine (ALPHAGAN) 0.2 % ophthalmic solution Place 1 drop into both eyes 2 (two) times daily.  . clopidogrel (PLAVIX) 75 MG tablet Take 1 tablet (75 mg total) by mouth daily.  . furosemide (LASIX) 20 MG tablet Take 20 mg by mouth daily.  Marland Kitchen gabapentin (NEURONTIN) 100 MG capsule Take 1 capsule (100 mg total) by mouth at bedtime.  . levETIRAcetam (KEPPRA) 500 MG tablet Take 1 tablet (500 mg total) by mouth 2 (two) times daily.  . Melatonin 5 MG TABS Take 1 tablet by mouth at bedtime.  . vitamin B-12 100 MCG tablet Take 1 tablet (100 mcg total)  by mouth daily.  . [DISCONTINUED] midodrine (PROAMATINE) 10 MG tablet Take 10 mg by mouth 2 (two) times daily.   No facility-administered encounter medications on file as of 02/05/2018.     Review of Systems  Unable to obtain due to dementia    Immunization History  Administered Date(s) Administered  . Influenza-Unspecified 03/04/2017  . Pneumococcal Polysaccharide-23 12/06/2017   Pertinent  Health Maintenance Due  Topic Date Due  . INFLUENZA VACCINE  01/02/2018  . PNA vac Low Risk Adult (2 of 2 - PCV13) 12/07/2018     Vitals:   02/05/18 1532  BP: 131/80  Pulse: 61  Resp: 18  Temp: (!) 97 F (36.1 C)  TempSrc: Oral  SpO2: 98%    Weight: 215 lb 3.2 oz (97.6 kg)  Height: 5\' 8"  (1.727 m)   Body mass index is 32.72 kg/m.  Physical Exam  GENERAL APPEARANCE: Well nourished. In no acute distress. Obese SKIN:  Skin is warm and dry.  MOUTH and THROAT: Lips are without lesions. Oral mucosa is moist and without lesions.  RESPIRATORY: Breathing is even & unlabored, BS CTAB CARDIAC: RRR, + murmur,no extra heart sounds, BLE 1+ edema GI: Abdomen soft, normal BS, no masses, no tenderness EXTREMITIES:  Able to move X 4 extremities NEUROLOGICAL: There is no tremor. PSYCHIATRIC: Alert to self, disoriented to time and place. Affect and behavior are appropriate   Labs reviewed: Recent Labs    12/04/17 2321  01/17/18 0452  01/18/18 0821 01/18/18 0930 01/18/18 1614 01/20/18 1422  NA 139   < > 140  --  139  --  139 141  K 3.8   < > 3.2*  --  3.8  --  3.7 3.6  CL 107   < > 109  --  109  --  106 108  CO2 25   < > 26  --  26  --  25 27  GLUCOSE 111*   < > 107*   < > 74  --  119* 102*  BUN 7*   < > 9  --  9  --  7* 9  CREATININE 1.04   < > 0.81  --  0.85 0.84 0.78 0.80  CALCIUM 8.4*   < > 8.0*  --  8.0*  --  8.4* 8.3*  MG 2.1  --  1.9  --   --   --  2.0  --    < > = values in this interval not displayed.   Recent Labs    12/04/17 2321 12/05/17 0454  AST 19 20  ALT 13 13  ALKPHOS 78 75  BILITOT 1.4* 1.6*  PROT 6.0* 5.9*  ALBUMIN 2.9* 2.8*   Recent Labs    12/05/17 0454  01/15/18 0948 01/16/18 0959 01/17/18 0452  WBC 7.9   < > 4.5 5.1 5.4  NEUTROABS 6.5  --   --   --   --   HGB 12.4*   < > 11.1* 12.3* 11.2*  HCT 39.0   < > 35.5* 38.5* 35.1*  MCV 90.1   < > 88.5 88.5 88.4  PLT 177   < > 160 173 164   < > = values in this interval not displayed.   Lab Results  Component Value Date   TSH 1.341 01/15/2018   Lab Results  Component Value Date   HGBA1C 5.6 12/06/2017   Lab Results  Component Value Date   CHOL 239 (H) 12/06/2017   HDL 44 12/06/2017  LDLCALC 180 (H) 12/06/2017   TRIG 74 12/06/2017    CHOLHDL 5.4 12/06/2017    Significant Diagnostic Results in last 30 days:  Dg Chest 2 View  Result Date: 01/16/2018 CLINICAL DATA:  Patient diagnosed with atrial fibrillation 01/08/2018. Bradycardia and syncope. EXAM: CHEST - 2 VIEW COMPARISON:  Single-view of the chest 01/11/2018. PA and lateral chest 12/04/2017. FINDINGS: The left hemidiaphragm is obscured consistent with left basilar airspace disease. The right lung is clear. No pneumothorax or pleural effusion. Heart size is mildly enlarged. Aortic atherosclerosis is noted. Degenerative disease about the right shoulder in thoracic spine. No acute or focal bony abnormality. IMPRESSION: Left basilar airspace disease which could be atelectasis or pneumonia. Electronically Signed   By: Drusilla Kanner M.D.   On: 01/16/2018 10:46   Ct Head Wo Contrast  Result Date: 01/11/2018 CLINICAL DATA:  Syncopal episode while getting out of car today. EXAM: CT HEAD WITHOUT CONTRAST TECHNIQUE: Contiguous axial images were obtained from the base of the skull through the vertex without intravenous contrast. COMPARISON:  December 04, 2017 FINDINGS: Brain: No evidence of acute infarction, hemorrhage, hydrocephalus, extra-axial collection or mass lesion/mass effect. There is chronic diffuse atrophy. Chronic bilateral periventricular white matter small vessel ischemic changes identified. Vascular: No hyperdense vessel or unexpected calcification. Skull: Normal. Negative for fracture or focal lesion. Sinuses/Orbits: No acute finding. Mild mucoperiosteal thickening of bilateral ethmoid sinuses are noted. Other: None. IMPRESSION: No focal acute intracranial abnormality identified. Chronic diffuse atrophy and chronic bilateral periventricular white matter small vessel ischemic change. Electronically Signed   By: Sherian Rein M.D.   On: 01/11/2018 19:15   Dg Chest Port 1 View  Result Date: 01/11/2018 CLINICAL DATA:  Syncope and vomiting today. EXAM: PORTABLE CHEST 1 VIEW  COMPARISON:  December 04, 2017 FINDINGS: The heart size and mediastinal contours are within normal limits. Both lungs are clear. The visualized skeletal structures are stable. IMPRESSION: No active cardiopulmonary disease. Electronically Signed   By: Sherian Rein M.D.   On: 01/11/2018 17:08   Dg Swallowing Func-speech Pathology  Result Date: 01/20/2018 Objective Swallowing Evaluation: Type of Study: MBS-Modified Barium Swallow Study  Patient Details Name: Edris Friedt MRN: [id number] Date of Birth: Jul 24, 1933 Today's Date: 01/20/2018 Time: SLP Start Time (ACUTE ONLY): 8295 -SLP Stop Time (ACUTE ONLY): 0939 SLP Time Calculation (min) (ACUTE ONLY): 11 min Past Medical History: Past Medical History: Diagnosis Date . A-fib (HCC)  . AS (aortic stenosis)  . Blind right eye  . Bradycardia  . Chicken pox  . Dementia  . Frequent urinary tract infections  . Glaucoma  . Hypertension  . PNA (pneumonia)  . Stroke [facility])   5 years ago . Syncope  Past Surgical History: Past Surgical History: Procedure Laterality Date . EYE SURGERY   HPI: Antoneo Ghrist is a 84 y.o. male with medical history significant of dementia, aortic stenosis hypertension presents with syncope. Per chart patient was treated for pneumonia in [place] earlier this month and has 2 more days on Levaquin.  CXR 8/15 Left basilar airspace disease which could be atelectasis or  Subjective: pt alert, pleasant mood, needs cues Assessment / Plan / Recommendation CHL IP CLINICAL IMPRESSIONS 01/20/2018 Clinical Impression Oropharyngeal swallow function found to be within normal limits within the confines of this MBS. Mastication of solid texture functional with mild challenge manipulating/coordinating dual consistencies of pill and liquid and needed multiple sips to assist pill transfer to posterior oral cavity. Swallow initiation timely with normal amount of post lingual residue  falling to vallecuale, sensed, swallowed and cleared. Hyolaryngeal excursion,  laryngeal elevation and epiglottic deflection normal to prevent penetration into laryngeal vestibule with any consistency. Esophageal scan without abnormalities however MBS only diagnoses the UES. Pt to continue regular texture, thin liquids, straws allowed and may consider pills with puree if difficulty transiting larger pills with thin. No ST follow up recommended at this time.  SLP Visit Diagnosis Dysphagia, unspecified (R13.10) Attention and concentration deficit following -- Frontal lobe and executive function deficit following -- Impact on safety and function Mild aspiration risk   CHL IP TREATMENT RECOMMENDATION 01/20/2018 Treatment Recommendations No treatment recommended at this time   No flowsheet data found. CHL IP DIET RECOMMENDATION 01/20/2018 SLP Diet Recommendations Regular solids;Thin liquid Liquid Administration via Straw;Cup Medication Administration Whole meds with liquid Compensations -- Postural Changes Seated upright at 90 degrees   CHL IP OTHER RECOMMENDATIONS 01/20/2018 Recommended Consults -- Oral Care Recommendations Oral care BID Other Recommendations --   CHL IP FOLLOW UP RECOMMENDATIONS 01/20/2018 Follow up Recommendations None   CHL IP FREQUENCY AND DURATION 12/05/2017 Speech Therapy Frequency (ACUTE ONLY) min 2x/week Treatment Duration 1 week      CHL IP ORAL PHASE 01/20/2018 Oral Phase WFL Oral - Pudding Teaspoon -- Oral - Pudding Cup -- Oral - Honey Teaspoon -- Oral - Honey Cup -- Oral - Nectar Teaspoon -- Oral - Nectar Cup -- Oral - Nectar Straw -- Oral - Thin Teaspoon -- Oral - Thin Cup -- Oral - Thin Straw -- Oral - Puree -- Oral - Mech Soft -- Oral - Regular -- Oral - Multi-Consistency -- Oral - Pill -- Oral Phase - Comment --  CHL IP PHARYNGEAL PHASE 01/20/2018 Pharyngeal Phase WFL Pharyngeal- Pudding Teaspoon -- Pharyngeal -- Pharyngeal- Pudding Cup -- Pharyngeal -- Pharyngeal- Honey Teaspoon -- Pharyngeal -- Pharyngeal- Honey Cup -- Pharyngeal -- Pharyngeal- Nectar Teaspoon --  Pharyngeal -- Pharyngeal- Nectar Cup -- Pharyngeal -- Pharyngeal- Nectar Straw -- Pharyngeal -- Pharyngeal- Thin Teaspoon -- Pharyngeal -- Pharyngeal- Thin Cup -- Pharyngeal -- Pharyngeal- Thin Straw -- Pharyngeal -- Pharyngeal- Puree -- Pharyngeal -- Pharyngeal- Mechanical Soft -- Pharyngeal -- Pharyngeal- Regular -- Pharyngeal -- Pharyngeal- Multi-consistency -- Pharyngeal -- Pharyngeal- Pill -- Pharyngeal -- Pharyngeal Comment --  CHL IP CERVICAL ESOPHAGEAL PHASE 01/20/2018 Cervical Esophageal Phase WFL Pudding Teaspoon -- Pudding Cup -- Honey Teaspoon -- Honey Cup -- Nectar Teaspoon -- Nectar Cup -- Nectar Straw -- Thin Teaspoon -- Thin Cup -- Thin Straw -- Puree -- Mechanical Soft -- Regular -- Multi-consistency -- Pill -- Cervical Esophageal Comment -- Royce Macadamia 01/20/2018, 10:07 AM  Breck Coons Lonell Face.Ed CCC-SLP Pager [phone]              Assessment/Plan  1. Status post fall - was seen on a prone position in his room, no complaints of pain on her joints, need to monitor more frequently, fall precautions   2. History of CVA with residual deficit - stable, continue Plavix 75 mg 1 tab daily and Lipitor 40 mg 1 tab every 6 p.m.   3. Aortic stenosis, moderate - ardiology recommended conservative management with repeat echo in 6 months   4. Neuropathy -  Stable, continue Neurontin 100 mg 1 capsule daily   5. Lower extremity edema - continue Lasix 20 mg 1 tab daily   6. Insomnia, unspecified type - continue melatonin 5 mg 1 tab daily at bedtime  .  Family/ staff Communication: Discussed plan of care with patient.  Labs/tests ordered:  None  Goals of care:   Short-term rehabilitation.    Kenard Gower, NP [facility] [phone] (Monday-Friday 8:00 a.m. - 5:00 p.m.) [phone] (after hours)

## 2018-02-11 ENCOUNTER — Encounter: Payer: Self-pay | Admitting: Neurology

## 2018-02-13 ENCOUNTER — Encounter: Payer: Self-pay | Admitting: Adult Health

## 2018-02-13 ENCOUNTER — Non-Acute Institutional Stay (SKILLED_NURSING_FACILITY): Payer: Medicare Other | Admitting: Adult Health

## 2018-02-13 DIAGNOSIS — I350 Nonrheumatic aortic (valve) stenosis: Secondary | ICD-10-CM

## 2018-02-13 DIAGNOSIS — R600 Localized edema: Secondary | ICD-10-CM

## 2018-02-13 DIAGNOSIS — I6930 Unspecified sequelae of cerebral infarction: Secondary | ICD-10-CM | POA: Diagnosis not present

## 2018-02-13 DIAGNOSIS — F419 Anxiety disorder, unspecified: Secondary | ICD-10-CM

## 2018-02-13 DIAGNOSIS — G40909 Epilepsy, unspecified, not intractable, without status epilepticus: Secondary | ICD-10-CM | POA: Diagnosis not present

## 2018-02-13 DIAGNOSIS — F0392 Unspecified dementia, unspecified severity, with psychotic disturbance: Secondary | ICD-10-CM

## 2018-02-13 DIAGNOSIS — F0391 Unspecified dementia with behavioral disturbance: Secondary | ICD-10-CM

## 2018-02-13 DIAGNOSIS — G4700 Insomnia, unspecified: Secondary | ICD-10-CM

## 2018-02-13 DIAGNOSIS — I10 Essential (primary) hypertension: Secondary | ICD-10-CM | POA: Diagnosis not present

## 2018-02-13 MED ORDER — CLOPIDOGREL BISULFATE 75 MG PO TABS: 75.0000 mg | ORAL_TABLET | Freq: Every day | ORAL | 0 refills | Status: AC

## 2018-02-13 MED ORDER — GABAPENTIN 100 MG PO CAPS: 100.0000 mg | ORAL_CAPSULE | Freq: Every day | ORAL | 0 refills | Status: AC

## 2018-02-13 MED ORDER — LEVETIRACETAM 500 MG PO TABS: 500.0000 mg | ORAL_TABLET | Freq: Two times a day (BID) | ORAL | 0 refills | Status: AC

## 2018-02-13 MED ORDER — RISPERIDONE 0.5 MG PO TABS: 0.5000 mg | ORAL_TABLET | Freq: Every day | ORAL | 0 refills | Status: AC

## 2018-02-13 MED ORDER — ATORVASTATIN CALCIUM 40 MG PO TABS: 40.0000 mg | ORAL_TABLET | Freq: Every day | ORAL | 0 refills | Status: DC

## 2018-02-13 MED ORDER — AMLODIPINE BESYLATE 2.5 MG PO TABS: 2.5000 mg | ORAL_TABLET | Freq: Every day | ORAL | 0 refills | Status: AC

## 2018-02-13 MED ORDER — LORAZEPAM 0.5 MG PO TABS: 0.5000 mg | ORAL_TABLET | Freq: Two times a day (BID) | ORAL | 0 refills | Status: AC | PRN

## 2018-02-13 MED ORDER — BRIMONIDINE TARTRATE 0.2 % OP SOLN: 1.0000 [drp] | Freq: Two times a day (BID) | OPHTHALMIC | 0 refills | Status: AC

## 2018-02-13 MED ORDER — FUROSEMIDE 20 MG PO TABS: 20.0000 mg | ORAL_TABLET | Freq: Every day | ORAL | 0 refills | Status: AC

## 2018-02-13 NOTE — Progress Notes (Signed)
Location:  [facility] Room Number: 312-A Place of Service:  SNF (31) Provider:  Kenard GowerMedina-Vargas, Monina, NP  Patient Care Team: Deeann SaintBanks, Shannon R, MD as PCP - General (Family Medicine)  Extended Emergency Contact Information Primary Emergency Contact: JosephBarnes,Ronita Address: [address]          [place], [place] [id number] [place] Home Phone: [phone] Mobile Phone: [phone] Relation: Daughter  Code Status:  Full Code  Goals of care: Advanced Directive information Advanced Directives 01/11/2018  Does Patient Have a Medical Advance Directive? Yes  Type of Advance Directive Healthcare Power of Attorney  Does patient want to make changes to medical advance directive? No - Patient declined  Copy of Healthcare Power of Attorney in Chart? Yes  Would patient like information on creating a medical advance directive? No - Patient declined     Chief Complaint  Patient presents with  . Discharge Note    The patient is discharging home on 02/14/18.    HPI:  Pt is an 84 y.o. male seen today for discharge.  He is to discharge home 02/14/18 with his daughter.  He will receive hoHe health PT, OT, ST, Nursing, Child psychotherapistocial Worker, and [employer] services.    He has been admitted to [facility] on 01/20/18 from the hospital with syncope and hypotension. Syncope was thought to be related to orthostatic hypotension. Amlodipine was discontinued and was started on Midodrine. Chest x-ray show atelectasis versus pneumonia. He was given IV antibiotics and then discharged on oral antibiotics which has been completed. He has a PMH of stroke, dementia with behavioral disturbances, essential hypertension, and stroke.    He was seen today. Review of his BP showed slight elevation -  156/90, 144/74, 148/75, 159/59. Midodrin has been discontinued on 01/30/18.  Patient was admitted to this facility for short-term rehabilitation after the patient's recent  hospitalization.  Patient has completed SNF rehabilitation and therapy has cleared the patient for discharge.   Past Medical History:  Diagnosis Date  . A-fib (HCC)   . AS (aortic stenosis)   . Blind right eye   . Bradycardia   . Chicken pox   . Dementia   . Frequent urinary tract infections   . Glaucoma   . Hypertension   . PNA (pneumonia)   . Stroke [facility](HCC)    5 years ago  . Syncope    Past Surgical History:  Procedure Laterality Date  . EYE SURGERY      Allergies  Allergen Reactions  . Xarelto [Rivaroxaban] Other (See Comments)    Per patients family, there is currently a lawsuit against xarelto in pt's name. Bleeding in the bowels    Outpatient Encounter Medications as of 02/13/2018  Medication Sig  . acetaminophen (TYLENOL) 325 MG tablet Take 2 tablets (650 mg total) by mouth every 6 (six) hours as needed for mild pain (or Fever >/= 101).  Marland Kitchen. amLODipine (NORVASC) 2.5 MG tablet Take 1 tablet (2.5 mg total) by mouth daily. Hold if systolic blood pressure less than 110.  Marland Kitchen. atorvastatin (LIPITOR) 40 MG tablet Take 1 tablet (40 mg total) by mouth daily at 6 PM.  . brimonidine (ALPHAGAN) 0.2 % ophthalmic solution Place 1 drop into both eyes 2 (two) times daily.  . clopidogrel (PLAVIX) 75 MG tablet Take 1 tablet (75 mg total) by mouth daily.  . cyanocobalamin 100 MCG tablet Take 100 mcg by mouth daily.  . furosemide (LASIX) 20 MG tablet Take 1 tablet (20 mg  total) by mouth daily.  Marland Kitchen gabapentin (NEURONTIN) 100 MG capsule Take 1 capsule (100 mg total) by mouth at bedtime.  . levETIRAcetam (KEPPRA) 500 MG tablet Take 1 tablet (500 mg total) by mouth 2 (two) times daily.  . Melatonin 5 MG TABS Take 1 tablet by mouth at bedtime.  . risperiDONE (RISPERDAL) 0.5 MG tablet Take 1 tablet (0.5 mg total) by mouth at bedtime.  Marland Kitchen UNABLE TO FIND Apply 1 application topically 3 (three) times daily as needed (Behavioral dysfunction). Med Name: Ativan gel 0.25%  . [DISCONTINUED] amLODipine  (NORVASC) 2.5 MG tablet Take 2.5 mg by mouth daily. Hold if systolic blood pressure less than 110.  . [DISCONTINUED] amLODipine (NORVASC) 5 MG tablet Take 5 mg by mouth daily. Hold if systolic blood pressure less than 110  . [DISCONTINUED] atorvastatin (LIPITOR) 40 MG tablet TAKE 1 TABLET (40 MG TOTAL) BY MOUTH DAILY AT 6 PM.  . [DISCONTINUED] brimonidine (ALPHAGAN) 0.2 % ophthalmic solution Place 1 drop into both eyes 2 (two) times daily.  . [DISCONTINUED] clopidogrel (PLAVIX) 75 MG tablet Take 1 tablet (75 mg total) by mouth daily.  . [DISCONTINUED] furosemide (LASIX) 20 MG tablet Take 20 mg by mouth daily.  . [DISCONTINUED] gabapentin (NEURONTIN) 100 MG capsule Take 1 capsule (100 mg total) by mouth at bedtime.  . [DISCONTINUED] levETIRAcetam (KEPPRA) 500 MG tablet Take 1 tablet (500 mg total) by mouth 2 (two) times daily.  . [DISCONTINUED] risperiDONE (RISPERDAL) 0.5 MG tablet Take 0.5 mg by mouth at bedtime.  . [DISCONTINUED] vitamin B-12 100 MCG tablet Take 1 tablet (100 mcg total) by mouth daily.  Marland Kitchen LORazepam (ATIVAN) 0.5 MG tablet Take 1 tablet (0.5 mg total) by mouth 2 (two) times daily as needed for anxiety.   No facility-administered encounter medications on file as of 02/13/2018.     Review of Systems  Unable to obtain due to dementia    Immunization History  Administered Date(s) Administered  . Influenza-Unspecified 03/04/2017  . Pneumococcal Polysaccharide-23 12/06/2017   Pertinent  Health Maintenance Due  Topic Date Due  . INFLUENZA VACCINE  01/02/2018  . PNA vac Low Risk Adult (2 of 2 - PCV13) 12/07/2018      Vitals:   02/13/18 1029  BP: 117/61  Pulse: 82  Resp: 18  Temp: 97.8 F (36.6 C)  TempSrc: Oral  SpO2: 95%  Weight: 211 lb 3.2 oz (95.8 kg)  Height: 5\' 8"  (1.727 m)   Body mass index is 32.11 kg/m.  Physical Exam  GENERAL APPEARANCE: Well nourished. In no acute distress. Obese SKIN:  Skin is warm and dry.  MOUTH and THROAT: Lips are without  lesions. Oral mucosa is moist and without lesions. Tongue is normal in shape, size, and color and without lesions RESPIRATORY: Breathing is even & unlabored, BS CTAB CARDIAC: RRR, no murmur,no extra heart sounds, BLE 1+ edema GI: Abdomen soft, normal BS, no masses, no tenderness EXTREMITIES:  Able to move X 4 extremities NEUROLOGICAL: There is no tremor. Speech is clear PSYCHIATRIC: Alert to self, disoriented to time and place. Affect and behavior are appropriate   Labs reviewed: Recent Labs    12/04/17 2321  01/17/18 0452  01/18/18 0821 01/18/18 0930 01/18/18 1614 01/20/18 1422  NA 139   < > 140  --  139  --  139 141  K 3.8   < > 3.2*  --  3.8  --  3.7 3.6  CL 107   < > 109  --  109  --  106 108  CO2 25   < > 26  --  26  --  25 27  GLUCOSE 111*   < > 107*   < > 74  --  119* 102*  BUN 7*   < > 9  --  9  --  7* 9  CREATININE 1.04   < > 0.81  --  0.85 0.84 0.78 0.80  CALCIUM 8.4*   < > 8.0*  --  8.0*  --  8.4* 8.3*  MG 2.1  --  1.9  --   --   --  2.0  --    < > = values in this interval not displayed.   Recent Labs    12/04/17 2321 12/05/17 0454  AST 19 20  ALT 13 13  ALKPHOS 78 75  BILITOT 1.4* 1.6*  PROT 6.0* 5.9*  ALBUMIN 2.9* 2.8*   Recent Labs    12/05/17 0454  01/15/18 0948 01/16/18 0959 01/17/18 0452  WBC 7.9   < > 4.5 5.1 5.4  NEUTROABS 6.5  --   --   --   --   HGB 12.4*   < > 11.1* 12.3* 11.2*  HCT 39.0   < > 35.5* 38.5* 35.1*  MCV 90.1   < > 88.5 88.5 88.4  PLT 177   < > 160 173 164   < > = values in this interval not displayed.   Lab Results  Component Value Date   TSH 1.341 01/15/2018   Lab Results  Component Value Date   HGBA1C 5.6 12/06/2017   Lab Results  Component Value Date   CHOL 239 (H) 12/06/2017   HDL 44 12/06/2017   LDLCALC 180 (H) 12/06/2017   TRIG 74 12/06/2017   CHOLHDL 5.4 12/06/2017    Significant Diagnostic Results in last 30 days:  Dg Chest 2 View  Result Date: 01/16/2018 CLINICAL DATA:  Patient diagnosed with atrial  fibrillation 01/08/2018. Bradycardia and syncope. EXAM: CHEST - 2 VIEW COMPARISON:  Single-view of the chest 01/11/2018. PA and lateral chest 12/04/2017. FINDINGS: The left hemidiaphragm is obscured consistent with left basilar airspace disease. The right lung is clear. No pneumothorax or pleural effusion. Heart size is mildly enlarged. Aortic atherosclerosis is noted. Degenerative disease about the right shoulder in thoracic spine. No acute or focal bony abnormality. IMPRESSION: Left basilar airspace disease which could be atelectasis or pneumonia. Electronically Signed   By: Drusilla Kanner M.D.   On: 01/16/2018 10:46   Dg Swallowing Func-speech Pathology  Result Date: 01/20/2018 Objective Swallowing Evaluation: Type of Study: MBS-Modified Barium Swallow Study  Patient Details Name: Dan Stone MRN: [id number] Date of Birth: 12-08-1933 Today's Date: 01/20/2018 Time: SLP Start Time (ACUTE ONLY): 4098 -SLP Stop Time (ACUTE ONLY): 0939 SLP Time Calculation (min) (ACUTE ONLY): 11 min Past Medical History: Past Medical History: Diagnosis Date . A-fib (HCC)  . AS (aortic stenosis)  . Blind right eye  . Bradycardia  . Chicken pox  . Dementia  . Frequent urinary tract infections  . Glaucoma  . Hypertension  . PNA (pneumonia)  . Stroke [facility])   5 years ago . Syncope  Past Surgical History: Past Surgical History: Procedure Laterality Date . EYE SURGERY   HPI: Lydell Moga is a 84 y.o. male with medical history significant of dementia, aortic stenosis hypertension presents with syncope. Per chart patient was treated for pneumonia in [place] earlier this month and has 2 more days on Levaquin.  CXR 8/15 Left basilar  airspace disease which could be atelectasis or  Subjective: pt alert, pleasant mood, needs cues Assessment / Plan / Recommendation CHL IP CLINICAL IMPRESSIONS 01/20/2018 Clinical Impression Oropharyngeal swallow function found to be within normal limits within the confines of this MBS.  Mastication of solid texture functional with mild challenge manipulating/coordinating dual consistencies of pill and liquid and needed multiple sips to assist pill transfer to posterior oral cavity. Swallow initiation timely with normal amount of post lingual residue falling to vallecuale, sensed, swallowed and cleared. Hyolaryngeal excursion, laryngeal elevation and epiglottic deflection normal to prevent penetration into laryngeal vestibule with any consistency. Esophageal scan without abnormalities however MBS only diagnoses the UES. Pt to continue regular texture, thin liquids, straws allowed and may consider pills with puree if difficulty transiting larger pills with thin. No ST follow up recommended at this time.  SLP Visit Diagnosis Dysphagia, unspecified (R13.10) Attention and concentration deficit following -- Frontal lobe and executive function deficit following -- Impact on safety and function Mild aspiration risk   CHL IP TREATMENT RECOMMENDATION 01/20/2018 Treatment Recommendations No treatment recommended at this time   No flowsheet data found. CHL IP DIET RECOMMENDATION 01/20/2018 SLP Diet Recommendations Regular solids;Thin liquid Liquid Administration via Straw;Cup Medication Administration Whole meds with liquid Compensations -- Postural Changes Seated upright at 90 degrees   CHL IP OTHER RECOMMENDATIONS 01/20/2018 Recommended Consults -- Oral Care Recommendations Oral care BID Other Recommendations --   CHL IP FOLLOW UP RECOMMENDATIONS 01/20/2018 Follow up Recommendations None   CHL IP FREQUENCY AND DURATION 12/05/2017 Speech Therapy Frequency (ACUTE ONLY) min 2x/week Treatment Duration 1 week      CHL IP ORAL PHASE 01/20/2018 Oral Phase WFL Oral - Pudding Teaspoon -- Oral - Pudding Cup -- Oral - Honey Teaspoon -- Oral - Honey Cup -- Oral - Nectar Teaspoon -- Oral - Nectar Cup -- Oral - Nectar Straw -- Oral - Thin Teaspoon -- Oral - Thin Cup -- Oral - Thin Straw -- Oral - Puree -- Oral - Mech Soft --  Oral - Regular -- Oral - Multi-Consistency -- Oral - Pill -- Oral Phase - Comment --  CHL IP PHARYNGEAL PHASE 01/20/2018 Pharyngeal Phase WFL Pharyngeal- Pudding Teaspoon -- Pharyngeal -- Pharyngeal- Pudding Cup -- Pharyngeal -- Pharyngeal- Honey Teaspoon -- Pharyngeal -- Pharyngeal- Honey Cup -- Pharyngeal -- Pharyngeal- Nectar Teaspoon -- Pharyngeal -- Pharyngeal- Nectar Cup -- Pharyngeal -- Pharyngeal- Nectar Straw -- Pharyngeal -- Pharyngeal- Thin Teaspoon -- Pharyngeal -- Pharyngeal- Thin Cup -- Pharyngeal -- Pharyngeal- Thin Straw -- Pharyngeal -- Pharyngeal- Puree -- Pharyngeal -- Pharyngeal- Mechanical Soft -- Pharyngeal -- Pharyngeal- Regular -- Pharyngeal -- Pharyngeal- Multi-consistency -- Pharyngeal -- Pharyngeal- Pill -- Pharyngeal -- Pharyngeal Comment --  CHL IP CERVICAL ESOPHAGEAL PHASE 01/20/2018 Cervical Esophageal Phase WFL Pudding Teaspoon -- Pudding Cup -- Honey Teaspoon -- Honey Cup -- Nectar Teaspoon -- Nectar Cup -- Nectar Straw -- Thin Teaspoon -- Thin Cup -- Thin Straw -- Puree -- Mechanical Soft -- Regular -- Multi-consistency -- Pill -- Cervical Esophageal Comment -- Royce Macadamia 01/20/2018, 10:07 AM  Breck Coons Lonell Face.Ed CCC-SLP Pager [phone]              Assessment/Plan  1. History of CVA with residual deficit - atorvastatin (LIPITOR) 40 MG tablet; Take 1 tablet (40 mg total) by mouth daily at 6 PM.  Dispense: 30 tablet; Refill: 0 - clopidogrel (PLAVIX) 75 MG tablet; Take 1 tablet (75 mg total) by mouth daily.  Dispense: 30 tablet; Refill: 0 -  amLODipine (NORVASC) 2.5 MG tablet; Take 1 tablet (2.5 mg total) by mouth daily. Hold if systolic blood pressure less than 110.  Dispense: 30 tablet; Refill: 0  2. Aortic stenosis, moderate - cardiology recommended conservative management with repeat echo in 6 months, Holter monitor X 48 hours completed   3. Essential hypertension - BPs noted to be elevated, will re-start Amlodipine 2.5 mg daily and to hold is SBP<  110   4. Seizure disorder (HCC) - gabapentin (NEURONTIN) 100 MG capsule; Take 1 capsule (100 mg total) by mouth at bedtime.  Dispense: 30 capsule; Refill: 0 - levETIRAcetam (KEPPRA) 500 MG tablet; Take 1 tablet (500 mg total) by mouth 2 (two) times daily.  Dispense: 60 tablet; Refill: 0  5. Insomnia, unspecified type - continue Melatonin 5 mg Q HS  6. Dementia with psychosis - BIMS score 7/15 and SLUMS 10/30 - risperiDONE (RISPERDAL) 0.5 MG tablet; Take 1 tablet (0.5 mg total) by mouth at bedtime.  Dispense: 30 tablet; Refill: 0  7. Lower extremity edema - furosemide (LASIX) 20 MG tablet; Take 1 tablet (20 mg total) by mouth daily.  Dispense: 30 tablet; Refill: 0  8. Anxiety - will decrease Ativan 0.5 mg 1/2 tab QID PRN to BID PRN - LORazepam (ATIVAN) 0.5 MG tablet; Take 1 tablet (0.5 mg total) by mouth 2 (two) times daily as needed for anxiety.  Dispense: 14 tablet; Refill: 0    I have filled out patient's discharge paperwork and written prescriptions.  Patient will receive home health PT, OT, ST, SW, Nursing, and PCS.  DME provided:  3-in-1 commode  Total discharge time: Greater than 30 minutes Greater than 50% was spent in coordination of care.  Discharge time involved coordination of the discharge process with social worker, nursing staff and therapy department. Medical justification for home health services/DME verified.    Kenard Gower, NP [facility] [phone] (Monday-Friday 8:00 a.m. - 5:00 p.m.) [phone] (after hours)

## 2018-02-14 ENCOUNTER — Telehealth: Payer: Self-pay | Admitting: Family Medicine

## 2018-02-14 NOTE — Telephone Encounter (Signed)
[facility] form to be filled out, placed in dr's folder. Please complete by 02/19/18.  Call for pick up upon completion [phone]

## 2018-02-17 NOTE — Telephone Encounter (Signed)
Pt's daughter is asking about form completion. She states they have appt with [facility] on Wednesday at 2pm and she needs form before then if possible.

## 2018-02-17 NOTE — Telephone Encounter (Signed)
Pt form has been received waiting for Dr Salomon FickBanks for complete when she returns to the office on Wednesday

## 2018-02-19 ENCOUNTER — Telehealth: Payer: Self-pay | Admitting: Family Medicine

## 2018-02-19 ENCOUNTER — Ambulatory Visit: Payer: Medicare Other | Admitting: Family Medicine

## 2018-02-19 NOTE — Telephone Encounter (Signed)
The patient's care giver Sherren KernsRonita Barnes came in to give an update on the patients medications.   She dropped of a paper with the new medications that the patient has started and the medications that the patient has stopped. She also would like to know if the patient can be put back on his medication for Dementia and Alzheimers. The patient was taken off of that medication and he is being very aggressive.  The paper is in the Dr's Folder   Please advise

## 2018-02-19 NOTE — Telephone Encounter (Signed)
Copied from CRM [id number]. Topic: Quick Communication - See Telephone Encounter >> Feb 19, 2018 11:02 AM Burchel, Abbi R wrote: CRM for notification. See Telephone encounter for: 02/19/18.   Enrique SackKendra requesting v/o for: Home heath aide for bathing 2x4wk, PT 3x2wk, 2x4wk, Nursing 2x6wk with 3 PRN visits  [phone]   Also, per Enrique SackKendra: pt's daughter reports that pt is very confused, got out of house and fell into a ditch (no injury), cursing at family members. Pt is exhibiting very out-of character behavior. Please advise.

## 2018-02-19 NOTE — Telephone Encounter (Signed)
Form is in the process of being completed. Will fax as soon as completed

## 2018-02-20 ENCOUNTER — Telehealth: Payer: Self-pay | Admitting: Family Medicine

## 2018-02-20 NOTE — Telephone Encounter (Signed)
Dan KernsRonita Barnes dropped of Adult Day Care forms  Call Ronita to pick up forms at: [phone]  Disposition: Dr's Folder

## 2018-02-21 ENCOUNTER — Telehealth: Payer: Self-pay

## 2018-02-21 NOTE — Telephone Encounter (Signed)
Copied from CRM [id number]. Topic: Inquiry >> Feb 21, 2018  2:02 PM Alexander BergeronBarksdale, Harvey B wrote: Reason for CRM: [facility] nursing orders are needing verbals and also for home health aid contact [phone]

## 2018-02-21 NOTE — Telephone Encounter (Signed)
Please advise 

## 2018-02-21 NOTE — Telephone Encounter (Signed)
Please Advise. Thank you

## 2018-02-21 NOTE — Telephone Encounter (Signed)
Spoke with Tamika with [facility], gave verbal orders as requested per Dr Salomon FickBanks

## 2018-02-22 NOTE — Telephone Encounter (Signed)
Ok to give VO.  Again pt's family was encouraged to f/u with psychiatry for behavioral concerns.

## 2018-02-22 NOTE — Telephone Encounter (Signed)
Pt has an appt next week will discuss concerns then.  Also pt and family advised to make an appt with psychiatry at last OFV.

## 2018-02-24 NOTE — Telephone Encounter (Signed)
Spoke with pt daughter voiced understanding that Dr Salomon FickBanks will discuss the issues on 02/26/2018 at pt appointment

## 2018-02-24 NOTE — Telephone Encounter (Signed)
Pt is scheduled to see Dr Salomon FickBanks on wednesday at 4.30 pm

## 2018-02-25 NOTE — Telephone Encounter (Signed)
Spoke with Enrique SackKendra voiced understanding of the verbal orders requested have been approved by dr Salomon FickBanks

## 2018-02-26 ENCOUNTER — Ambulatory Visit (INDEPENDENT_AMBULATORY_CARE_PROVIDER_SITE_OTHER): Payer: Medicare Other | Admitting: Family Medicine

## 2018-02-26 ENCOUNTER — Encounter: Payer: Self-pay | Admitting: Family Medicine

## 2018-02-26 VITALS — BP 110/62 | HR 94 | Temp 98.1°F | Wt 200.0 lb

## 2018-02-26 DIAGNOSIS — I951 Orthostatic hypotension: Secondary | ICD-10-CM | POA: Diagnosis not present

## 2018-02-26 DIAGNOSIS — I6930 Unspecified sequelae of cerebral infarction: Secondary | ICD-10-CM

## 2018-02-26 DIAGNOSIS — F0151 Vascular dementia with behavioral disturbance: Secondary | ICD-10-CM

## 2018-02-26 DIAGNOSIS — F01518 Vascular dementia, unspecified severity, with other behavioral disturbance: Secondary | ICD-10-CM

## 2018-02-27 ENCOUNTER — Telehealth: Payer: Self-pay

## 2018-02-27 ENCOUNTER — Encounter: Payer: Self-pay | Admitting: Family Medicine

## 2018-02-27 NOTE — Telephone Encounter (Signed)
Spoke with pt daughter voiced understanding that one of the forms she dropped off at the office was ready for pick up at the front office.

## 2018-02-27 NOTE — Progress Notes (Signed)
Subjective:    Patient ID: Dan Stone, male    DOB: 11-Apr-1934, 84 y.o.   MRN: [id number]  No chief complaint on file.   HPI Patient was seen today for f/u.  Pt's daughter [name]) dropped off several forms for adult day cares.  Per daughter pt's behavior has worsened in the last 2 wks.  Pt has flipped over tables in her home and started cursing. Pt has also been getting less sleep.  Memory also noted as slightly worse.    Since initial OFV on 01/09/18 pt was hospitalized on 8/10 for syncope and hypotension, meds were stopped, midodrine started.  He then went to rehab at [facility] from 8/19-9/12/19.  Pt's medications were changed. He was started on Ativan 0.5 mg BID prn, Norvasc decreased to 2.5 mg.   Past Medical History:  Diagnosis Date  . A-fib (HCC)   . AS (aortic stenosis)   . Blind right eye   . Bradycardia   . Chicken pox   . Dementia   . Frequent urinary tract infections   . Glaucoma   . Hypertension   . PNA (pneumonia)   . Stroke [facility])    5 years ago  . Syncope     Allergies  Allergen Reactions  . Xarelto [Rivaroxaban] Other (See Comments)    Per patients family, there is currently a lawsuit against xarelto in pt's name. Bleeding in the bowels    ROS General: Denies fever, chills, night sweats, changes in weight, changes in appetite HEENT: Denies headaches, ear pain, changes in vision, rhinorrhea, sore throat CV: Denies CP, palpitations, SOB, orthopnea Pulm: Denies SOB, cough, wheezing GI: Denies abdominal pain, nausea, vomiting, diarrhea, constipation GU: Denies dysuria, hematuria, frequency, vaginal discharge Msk: Denies muscle cramps, joint pains Neuro: Denies weakness, numbness, tingling Skin: Denies rashes, bruising Psych: Denies depression, anxiety, hallucinations  +dementia     Objective:    Blood pressure 110/62, pulse 94, temperature 98.1 F (36.7 C), temperature source Oral, weight 200 lb (90.7 kg), SpO2 98 %.   Gen.  Pleasant, well-nourished, in no distress.  Pt flirtatious with this provider during visit. HEENT: [place]/AT, face symmetric, no scleral icterus, PERRLA, nares patent without drainage Lungs: no accessory muscle use, CTAB, no wheezes or rales Cardiovascular: RRR, no m/r/g, no peripheral edema Neuro:  Pt unable to answer the date or time, instead changes the subject. CN II-XII grossly intact, gait not assessed as patient sitting in transport wheelchair. Skin:  Warm, no lesions/ rash   Wt Readings from Last 3 Encounters:  02/26/18 200 lb (90.7 kg)  02/13/18 211 lb 3.2 oz (95.8 kg)  02/05/18 215 lb 3.2 oz (97.6 kg)    Lab Results  Component Value Date   WBC 5.4 01/17/2018   HGB 11.2 (L) 01/17/2018   HCT 35.1 (L) 01/17/2018   PLT 164 01/17/2018   GLUCOSE 102 (H) 01/20/2018   CHOL 239 (H) 12/06/2017   TRIG 74 12/06/2017   HDL 44 12/06/2017   LDLCALC 180 (H) 12/06/2017   ALT 13 12/05/2017   AST 20 12/05/2017   NA 141 01/20/2018   K 3.6 01/20/2018   CL 108 01/20/2018   CREATININE 0.80 01/20/2018   BUN 9 01/20/2018   CO2 27 01/20/2018   TSH 1.341 01/15/2018   HGBA1C 5.6 12/06/2017    Assessment/Plan:  Vascular dementia with behavior disturbance (HCC) -Discussed disease progression with patient's daughter. -We will restart Namenda at daughter's request, however concerned given pt's h/o bradycardia.  Advised medication  will not restore pt's memory, only slow the progression of dementia if that.  -pending neurology appt 10/2 and neuropsych and new cardiology appt 10/10  History of CVA with residual deficit -continue plavix  Orthostatic hypotension -discussed importance of hydration -encouraged to monitor bp at home -advised against restarting numerous meds at once given potential to cause hypotension -also advised pt's daughter against using Ativan as pt would be at increased risk of falls/bleeding from fall given plavix use.  Will complete forms for adult daycare.  When complete  pt's daughter will be notified for pick up.  F/u in next few months.  Abbe Amsterdam, MD

## 2018-03-03 ENCOUNTER — Telehealth: Payer: Self-pay | Admitting: Family Medicine

## 2018-03-03 NOTE — Telephone Encounter (Signed)
Pt daughter [name]) came in to the office to pick up her fathers [facility] Form and was wondering if the Rx sertraline and would like for it to be called in to his pharmacy([facility] on [address]) today if possible.  Daughter is aware that Dr. Salomon Fick is not in the office and will be returning on Wednesday March 05, 2018 she also would like to have called.

## 2018-03-03 NOTE — Telephone Encounter (Signed)
Pt daughterHarriett Sine )  would like a call  [phone]  [phone] have questions did not give any other information

## 2018-03-06 NOTE — Telephone Encounter (Signed)
Pt's daughter calling to check on the RX for Sertraline.

## 2018-03-06 NOTE — Telephone Encounter (Signed)
I do not show that Dr. Salomon Fick has prescribed this in the past.  Please advise.

## 2018-03-07 ENCOUNTER — Telehealth: Payer: Self-pay | Admitting: Family Medicine

## 2018-03-07 NOTE — Telephone Encounter (Signed)
Copied from CRM [id number]. Topic: Quick Communication - See Telephone Encounter >> Mar 07, 2018  9:36 AM Lorrine Kin, NT wrote: CRM for notification. See Telephone encounter for: 03/07/18. Enrique Sack, Physical therapist with [facility] calling to requesting verbal order for physical therapy 2x a week for 3 weeks States that the patient's mental status has declined. States that the previous home health agency that was seeing him declined to pick his case back up because they found him naked on the floor multiple times. Does not have care giver at this time. Daughter will probably take him to hospital today to see if she can get him admitted for a few days. Enrique Sack would like a call to discuss. CB#: [phone]

## 2018-03-10 NOTE — Telephone Encounter (Signed)
ok 

## 2018-03-10 NOTE — Telephone Encounter (Signed)
Ok for verbal orders for PT.

## 2018-03-10 NOTE — Telephone Encounter (Signed)
Pt is aware to pick up the dads form from the office, she is also aware that our office will call her when the other forms are complete and ready for pick up

## 2018-03-11 ENCOUNTER — Other Ambulatory Visit: Payer: Self-pay

## 2018-03-11 DIAGNOSIS — I6930 Unspecified sequelae of cerebral infarction: Secondary | ICD-10-CM

## 2018-03-12 ENCOUNTER — Ambulatory Visit: Payer: Medicare Other | Admitting: Internal Medicine

## 2018-03-12 ENCOUNTER — Encounter: Payer: Self-pay | Admitting: Internal Medicine

## 2018-03-12 DIAGNOSIS — I480 Paroxysmal atrial fibrillation: Secondary | ICD-10-CM | POA: Insufficient documentation

## 2018-03-12 NOTE — Progress Notes (Deleted)
Cardiology Office Note:    Date:  03/12/2018   ID:  Dan Stone, DOB 07-Nov-1933, MRN [id number]  PCP:  Deeann Saint, MD  Cardiologist:  No primary care provider on file.  Electrophysiologist:  None   Referring MD: Deeann Saint, MD   No chief complaint on file. ***  History of Present Illness:    Dan Stone is a 84 y.o. male with a hx of atrial fibrillation, bradycardia, vascular dementia with behavioral dyscontrol, CVA with residual deficit on Plavix, orthostatic hypotension, and aortic stenosis.  He presents today at the request of Dr. Abbe Amsterdam.  In addition to his cardiovascular problems noted above, we are asked to comment on use of Namenda (memantine) in the setting of atrial fibrillation with slow ventricular response.  Past Medical History:  Diagnosis Date  . A-fib (HCC)   . AS (aortic stenosis)   . Blind right eye   . Bradycardia   . Chicken pox   . Dementia   . Frequent urinary tract infections   . Glaucoma   . Hypertension   . PNA (pneumonia)   . Stroke [facility])    5 years ago  . Syncope     Past Surgical History:  Procedure Laterality Date  . EYE SURGERY      Current Medications: No outpatient medications have been marked as taking for the 03/12/18 encounter (Appointment) with Parke Poisson, MD.     Allergies:   Xarelto [rivaroxaban]   Social History   Socioeconomic History  . Marital status: Single    Spouse name: Not on file  . Number of children: Not on file  . Years of education: Not on file  . Highest education level: Not on file  Occupational History  . Not on file  Social Needs  . Financial resource strain: Not on file  . Food insecurity:    Worry: Not on file    Inability: Not on file  . Transportation needs:    Medical: Not on file    Non-medical: Not on file  Tobacco Use  . Smoking status: Never Smoker  . Smokeless tobacco: Never Used  Substance and Sexual Activity  . Alcohol use: Never   Frequency: Never  . Drug use: Never  . Sexual activity: Not Currently  Lifestyle  . Physical activity:    Days per week: Not on file    Minutes per session: Not on file  . Stress: Not on file  Relationships  . Social connections:    Talks on phone: Not on file    Gets together: Not on file    Attends religious service: Not on file    Active member of club or organization: Not on file    Attends meetings of clubs or organizations: Not on file    Relationship status: Not on file  Other Topics Concern  . Not on file  Social History Narrative  . Not on file     Family History: The patient's ***family history includes CAD in his father.  ROS:   Please see the history of present illness.    *** All other systems reviewed and are negative.  EKGs/Labs/Other Studies Reviewed:    The following studies were reviewed today: Echocardiogram 12/05/17-personally reviewed images Study Conclusions  - Left ventricle: The cavity size was normal. Wall thickness was   increased in a pattern of mild LVH. Systolic function was normal.   The estimated ejection fraction was in the range of 60% to  65%.   Wall motion was normal; there were no regional wall motion   abnormalities. Doppler parameters are consistent with abnormal   left ventricular relaxation (grade 1 diastolic dysfunction). - Aortic valve: Mildly calcified annulus. Trileaflet; mildly   thickened, moderately calcified leaflets. There was severe   stenosis. Mean gradient (S): 31 mm Hg. VTI ratio of LVOT to   aortic valve: 0.2. Valve area (VTI): 0.69 cm^2. Valve area   (Vmean): 0.68 cm^2. - Mitral valve: Mildly calcified annulus. There was trivial   regurgitation. - Right atrium: Central venous pressure (est): 3 mm Hg. - Atrial septum: No defect or patent foramen ovale was identified. - Tricuspid valve: There was mild regurgitation. - Pulmonary arteries: PA peak pressure: 27 mm Hg (S). - Pericardium, extracardiac: There was no  pericardial effusion.  ECG: ECG from 01/18/2018 documents atrial fibrillation with slow ventricular response.  EKG:  EKG is *** ordered today.  The ekg ordered today demonstrates ***  Recent Labs: 12/04/2017: B Natriuretic Peptide 172.2 12/05/2017: ALT 13 01/15/2018: TSH 1.341 01/17/2018: Hemoglobin 11.2; Platelets 164 01/18/2018: Magnesium 2.0 01/20/2018: BUN 9; Creatinine, Ser 0.80; Potassium 3.6; Sodium 141  Recent Lipid Panel    Component Value Date/Time   CHOL 239 (H) 12/06/2017 0617   TRIG 74 12/06/2017 0617   HDL 44 12/06/2017 0617   CHOLHDL 5.4 12/06/2017 0617   VLDL 15 12/06/2017 0617   LDLCALC 180 (H) 12/06/2017 0617    Physical Exam:    VS:  There were no vitals taken for this visit.    Wt Readings from Last 3 Encounters:  02/26/18 200 lb (90.7 kg)  02/13/18 211 lb 3.2 oz (95.8 kg)  02/05/18 215 lb 3.2 oz (97.6 kg)     GEN: *** Well nourished, well developed in no acute distress HEENT: Normal NECK: No JVD; No carotid bruits LYMPHATICS: No lymphadenopathy CARDIAC: ***RRR, no murmurs, rubs, gallops RESPIRATORY:  Clear to auscultation without rales, wheezing or rhonchi  ABDOMEN: Soft, non-tender, non-distended MUSCULOSKELETAL:  No edema; No deformity  SKIN: Warm and dry NEUROLOGIC:  Alert and oriented x 3 PSYCHIATRIC:  Normal affect   ASSESSMENT:    1. Aortic stenosis, moderate   2. History of CVA with residual deficit   3. Orthostatic hypotension   4. Paroxysmal atrial fibrillation (HCC)    PLAN:    In order of problems listed above:  1. ***   Medication Adjustments/Labs and Tests Ordered: Current medicines are reviewed at length with the patient today.  Concerns regarding medicines are outlined above.  No orders of the defined types were placed in this encounter.  No orders of the defined types were placed in this encounter.   There are no Patient Instructions on file for this visit.   Signed, Parke Poisson, MD  03/12/2018 8:21 AM    [facility]

## 2018-03-12 NOTE — Telephone Encounter (Signed)
Pt's daughter was advised during office visit that we were not restarting every med that was d/c'd all at once.  Pt had an appt with neuropsych and we would wait to see what happened then before re-adding sertraline.

## 2018-03-12 NOTE — Telephone Encounter (Signed)
Please Advise

## 2018-03-12 NOTE — Telephone Encounter (Signed)
Su Hilt with [facility] left a detailed message with approval for verbal orders for PT on patient.

## 2018-03-13 ENCOUNTER — Encounter: Payer: Self-pay | Admitting: *Deleted

## 2018-03-13 NOTE — Telephone Encounter (Signed)
Spoke with Enrique Sack  With [facility] voiced understanding that Dr Salomon Fick approved the verbal orders for PT on patient.

## 2018-03-13 NOTE — Telephone Encounter (Signed)
Called pt daughter who is on his DPR  Left a detailed message that the forms are ready for pick up at the office.

## 2018-03-27 NOTE — Telephone Encounter (Signed)
Attempted to call pt daughter again regarding her fathers paperwork, left a message that forms are ready for pick up

## 2018-03-30 ENCOUNTER — Other Ambulatory Visit: Payer: Self-pay | Admitting: Adult Health

## 2018-03-30 DIAGNOSIS — R600 Localized edema: Secondary | ICD-10-CM

## 2018-03-31 ENCOUNTER — Ambulatory Visit: Payer: Medicare Other | Admitting: Neurology

## 2018-04-09 ENCOUNTER — Encounter (HOSPITAL_COMMUNITY): Payer: Self-pay | Admitting: *Deleted

## 2018-04-09 ENCOUNTER — Emergency Department (HOSPITAL_COMMUNITY)
Admission: EM | Admit: 2018-04-09 | Discharge: 2018-05-04 | Disposition: E | Payer: Medicare Other | Attending: Emergency Medicine | Admitting: Emergency Medicine

## 2018-04-09 DIAGNOSIS — I10 Essential (primary) hypertension: Secondary | ICD-10-CM | POA: Insufficient documentation

## 2018-04-09 DIAGNOSIS — I469 Cardiac arrest, cause unspecified: Secondary | ICD-10-CM | POA: Diagnosis present

## 2018-04-09 DIAGNOSIS — F0390 Unspecified dementia without behavioral disturbance: Secondary | ICD-10-CM | POA: Insufficient documentation

## 2018-04-09 DIAGNOSIS — Z8673 Personal history of transient ischemic attack (TIA), and cerebral infarction without residual deficits: Secondary | ICD-10-CM | POA: Insufficient documentation

## 2018-05-04 NOTE — ED Notes (Signed)
Time of Death 7:58 called by Jacqulyn Bath, MD

## 2018-05-04 NOTE — ED Provider Notes (Signed)
Emergency Department Provider Note   I have reviewed the triage vital signs and the nursing notes.   HISTORY  Chief Complaint No chief complaint on file.   HPI Dan Stone is a 84 y.o. male with PMH of A-fib, HTN, CVA, AS, and Dementia resents to the emergency department by EMS in cardiac arrest.  Patient was last seen at 3 AM this morning by family.  He was found by family later in the morning unresponsive and without a pulse.  EMS arrived and initiated ACLS.  Patient was intubated on scene.  The presenting rhythm was asystole.  The patient was cool on arrival but not in rigor.  EMS gave 5 epinephrine with no change in rhythm.  At family's request, the patient was transported to the emergency department.   Level 5 caveat: CPR in progress  Past Medical History:  Diagnosis Date  . A-fib (HCC)   . AS (aortic stenosis)   . Blind right eye   . Bradycardia   . Chicken pox   . Dementia (HCC)   . Frequent urinary tract infections   . Glaucoma   . Hypertension   . Orthostatic hypotension 01/12/2018  . PNA (pneumonia)   . Stroke [facility])    5 years ago  . Syncope     Patient Active Problem List   Diagnosis Date Noted  . Paroxysmal atrial fibrillation (HCC) 03/12/2018  . Edema 01/30/2018  . Wandering atrial pacemaker 01/12/2018  . Aortic stenosis, moderate 01/12/2018  . Orthostatic hypotension 01/12/2018  . Syncope 01/12/2018  . Hypotension 01/11/2018  . History of CVA with residual deficit   . Nonrheumatic aortic valve stenosis   . Retinal artery occlusion   . Syncope and collapse 12/05/2017  . Essential hypertension 12/05/2017  . Dementia (HCC) 12/05/2017    Past Surgical History:  Procedure Laterality Date  . EYE SURGERY     Allergies Xarelto [rivaroxaban]  Family History  Problem Relation Age of Onset  . CAD Father     Social History Social History   Tobacco Use  . Smoking status: Never Smoker  . Smokeless tobacco: Never Used  Substance Use  Topics  . Alcohol use: Never    Frequency: Never  . Drug use: Never    Review of Systems  Level 5 caveat: CPR in progress.  ____________________________________________   PHYSICAL EXAM:  VITAL SIGNS: Pulse: 0 BP: unable to obtain  Vitals:   04/11/18 0804  Temp: 98.2 F (36.8 C)    Constitutional: Intubated on arrival with CPR in progress.  Eyes: Conjunctivae are normal. Head: Atraumatic. Nose: No blood appreciated.  Mouth/Throat: Mucous membranes are dry. Neck: No stridor.   Cardiovascular: Asystole. Cold extremities.   Respiratory: Intubated with good air entry and bilateral breath sounds.  Gastrointestinal: Soft. Mild distention.  Musculoskeletal:  No gross deformities of extremities. Neurologic: Unresponsive.  Skin: Cool and dry.  ____________________________________________   PROCEDURES  Procedure(s) performed:   Procedures  Cardiopulmonary Resuscitation (CPR) Procedure Note Directed/Performed by: Maia Plan I personally directed ancillary staff and/or performed CPR in an effort to regain return of spontaneous circulation and to maintain cardiac, neuro and systemic perfusion.   ____________________________________________   INITIAL IMPRESSION / ASSESSMENT AND PLAN / ED COURSE  Pertinent labs & imaging results that were available during my care of the patient were reviewed by me and considered in my medical decision making (see chart for details).  Patient arrives to the emergency department with CPR in progress.  The patient's  presenting rhythm was asystole and they are cool to touch.  Last seen at 3 AM.  Unknown downtime.  The patient was transferred to our monitor and ETT position was assessed.  At first pulse check the patient's rhythm remained asystole.  At that time it was deemed to be futile to continue interventions and the CPR was discontinued.  The patient's time of death is 7:58 AM.   Will not refer to ME for review. I have completed the  death certificate in the ED. Updated daughter in the consultation room and will be available if any additional family arrive.   ____________________________________________  FINAL CLINICAL IMPRESSION(S) / ED DIAGNOSES  Final diagnoses:  Cardiac arrest [facility])    Note:  This document was prepared using Dragon voice recognition software and may include unintentional dictation errors.  Alona Bene, MD Emergency Medicine    , Arlyss Repress, MD Apr 10, 2018 [phone]

## 2018-05-04 NOTE — ED Notes (Addendum)
Spoke with Gregary Signs from Pt. Placement and informed him that family has not decided on a funeral home.   Pt. Sons is :  Courtney Paris   [phone] [address]  [place] , [place]  Pt.s wife is :  Pam Drown  [place] , [place] gave family Pt. Placement information to call when they decide on the funeral home

## 2018-05-04 NOTE — ED Notes (Signed)
Pt. 's family son and daughter was into spend time with the pt.  Chaplain accompanied the family

## 2018-05-04 NOTE — Progress Notes (Signed)
Responded to page from ED to support family in consultation room A.  Pt is deceased. Family arrived and have viewed body. Staff is taking body to morgue.  Family did not know funeral home and indicated they needed to gather and talk.  Patient placement card was given to older son Lexus Barletta with instruction on how to proceed. Provided emotional and grief support.  Facilitated information sharing between family and staff.  Venida Jarvis, [place], [facility], Pager [phone]

## 2018-05-04 NOTE — ED Triage Notes (Signed)
Pt in from [facility] from home, last seen alive at 3am, pt asystole upon arrival of EMS, family requesting to be transferred, pt on [place] upon arrival to ED, pt has IO to R tib, pt has 7.0 ET tube in place

## 2018-05-04 NOTE — ED Notes (Signed)
Long,MD and This Rn spoke with the pts daughter, family will be arriving and once all the pts children are present family is wanting to see the pt at that time

## 2018-05-04 NOTE — Progress Notes (Signed)
   04-17-2018 0800  Clinical Encounter Type  Visited With Family  Visit Type Initial  Referral From Nurse  Consult/Referral To Chaplain  Spiritual Encounters  Spiritual Needs Grief support  Stress Factors  Patient Stress Factors None identified  Family Stress Factors None identified   Responded to page for ED. Family was in consult A. Family did not want to any visit from Chaplain at the moment. Family was evidently in an emotional state. I offered silent prayer. Chaplain available as needed.   Chaplain Orest Dikes [phone]

## 2018-05-04 NOTE — Code Documentation (Signed)
Patient time of death occurred at 7:58

## 2018-05-04 DEATH — deceased

## 2020-02-20 IMAGING — MR MR MRA HEAD W/O CM
1 series · 24 of 48 positions shown · non-contrast
Comparison: Prior MRI from 12/05/2017.

CLINICAL DATA: Follow-up examination for acute stroke.

EXAM:
MRA HEAD WITHOUT CONTRAST
TECHNIQUE: Angiographic images of the Circle of Willis were obtained using MRA
technique without intravenous contrast.

[Series 5: 3d cow · axial · 0.5mm · 0.41mm/px · z∈[-64,+23]mm · 24 of 188 slices shown]
[im 1/188]
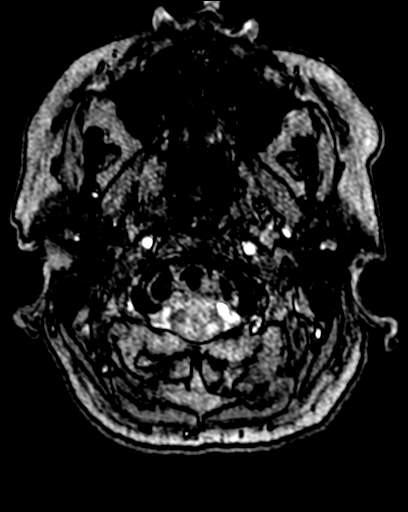
[im 4/188]
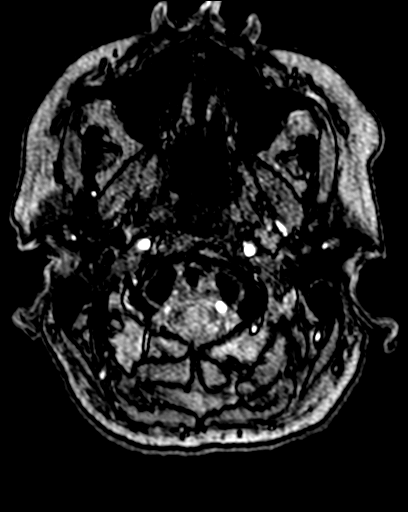
[im 8/188]
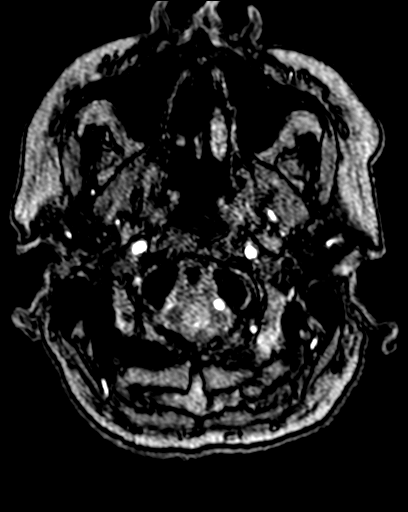
[im 12/188]
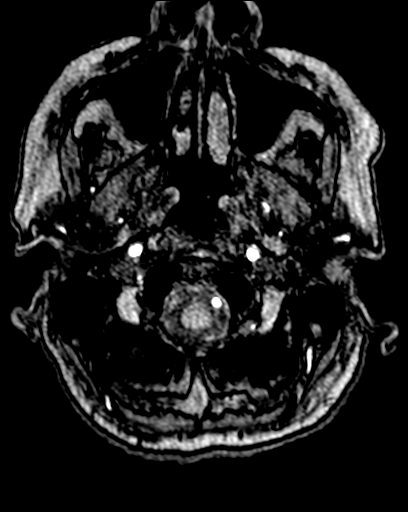
[im 16/188]
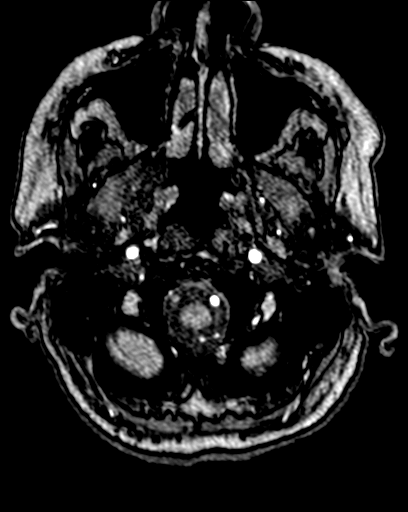
[im 20/188]
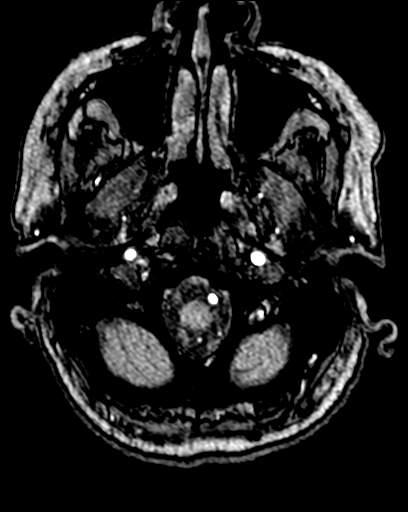
[im 24/188]
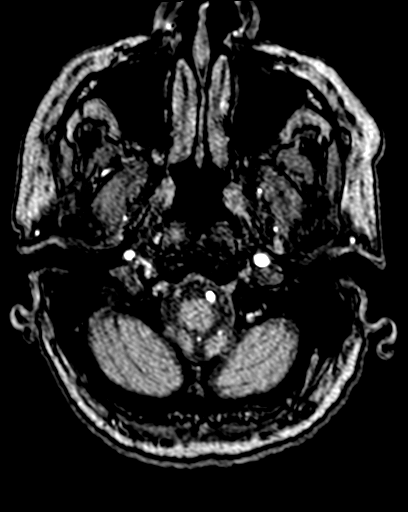
[im 28/188]
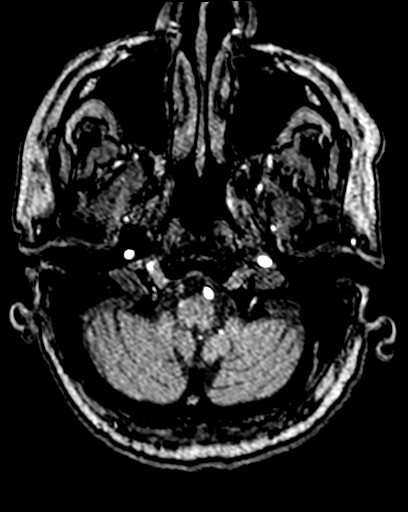
[im 32/188]
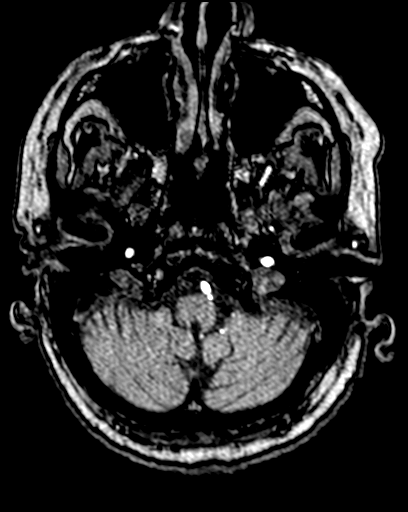
[im 36/188]
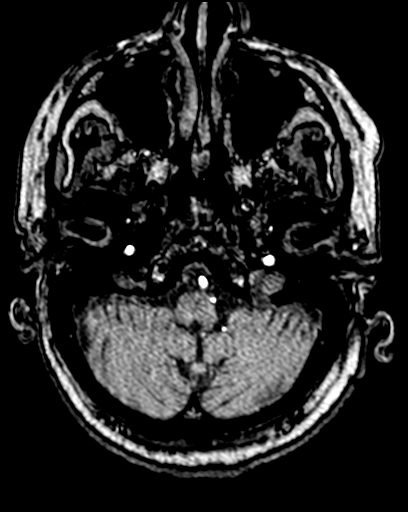
[im 40/188]
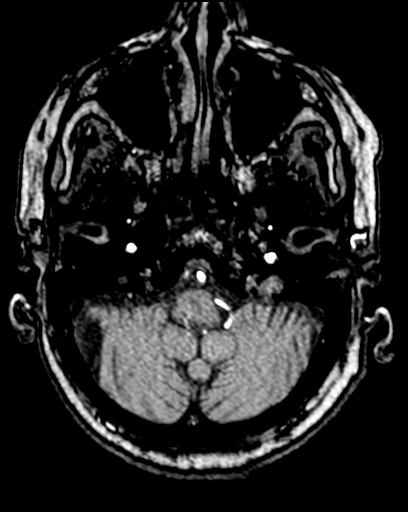
[im 44/188]
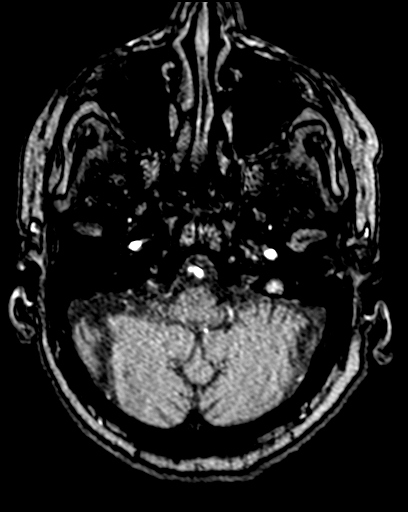
[im 48/188]
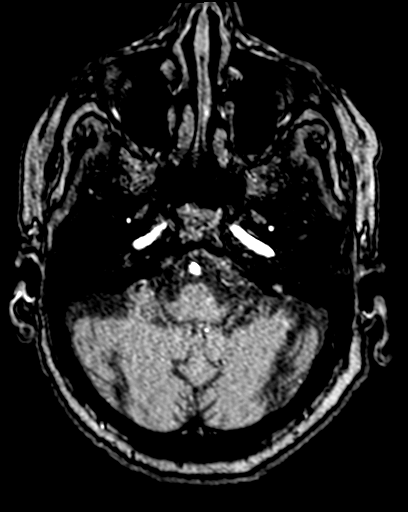
[im 52/188]
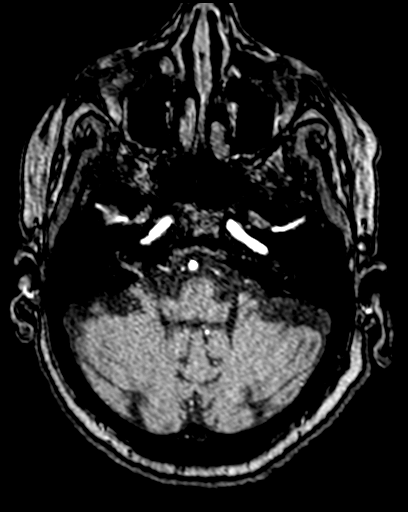
[im 56/188]
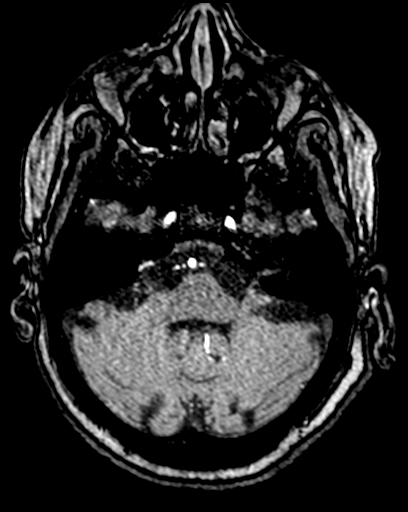
[im 60/188]
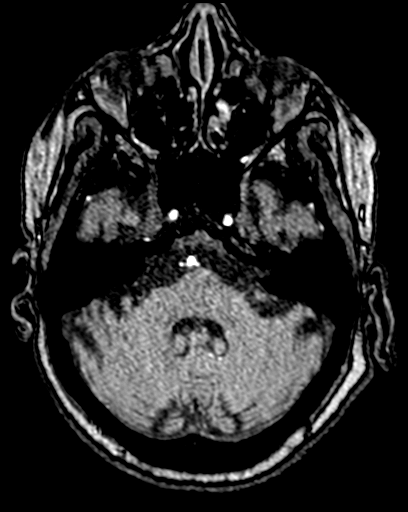
[im 64/188]
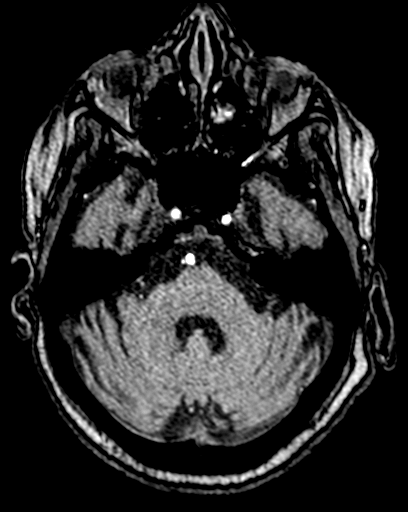
[im 84/188]
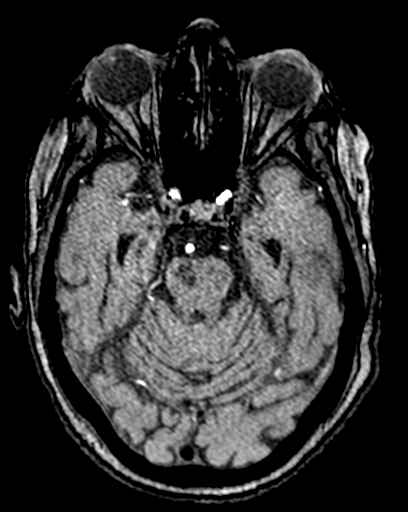
[im 96/188]
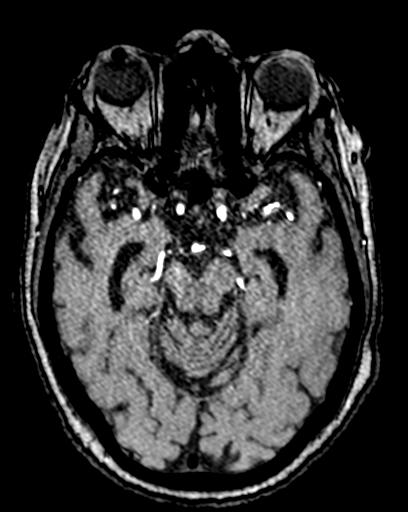
[im 108/188]
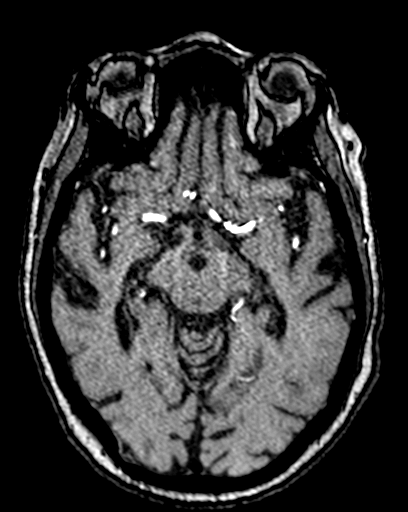
[im 132/188]
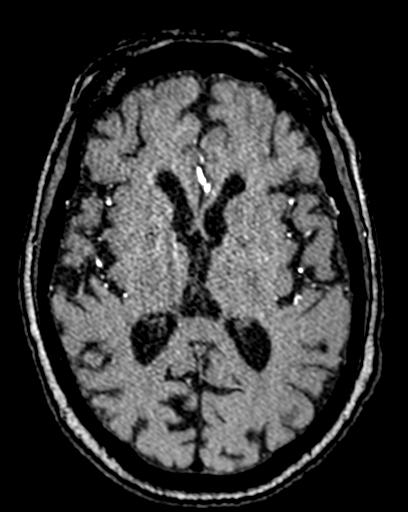
[im 156/188]
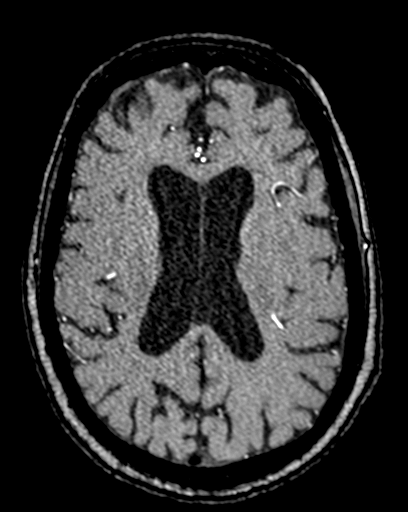
[im 160/188]
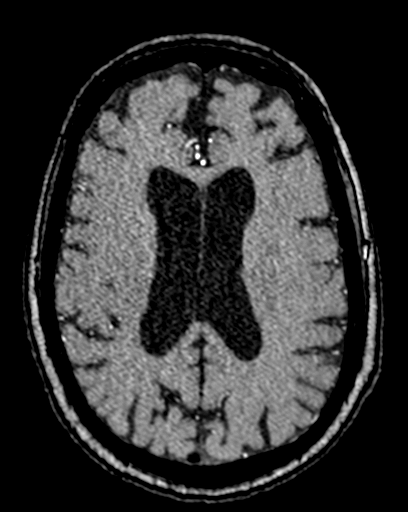
[im 180/188]
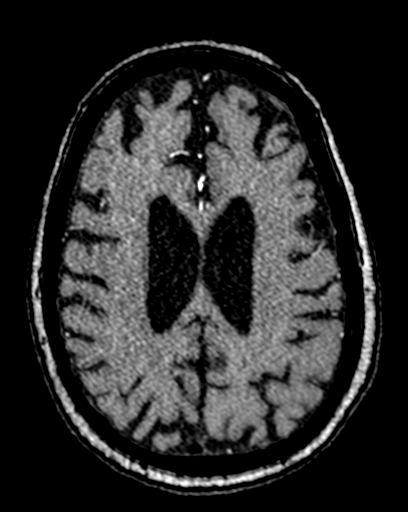

[24 of 48 positions shown; findings below may reference images not displayed]

FINDINGS: ANTERIOR CIRCULATION:

Distal cervical segments of the internal carotid arteries are patent
with antegrade flow. Petrous segments patent bilaterally. Scattered
atheromatous irregularity within the cavernous ICAs without
hemodynamically significant stenosis. ICA termini patent. Left A1
patent. Right A1 hypoplastic and/or absent. Normal anterior
communicating artery. Anterior cerebral arteries demonstrate mild
atheromatous irregularity but are patent to their distal aspects
without high-grade stenosis.

Mild atheromatous change within the M1 segments without significant
stenosis. Normal MCA bifurcations. Distal MCA branches well perfused
and symmetric.

POSTERIOR CIRCULATION:

Left vertebral artery patent to the vertebrobasilar junction without
high-grade stenosis. Patent left PICA. Right vertebral artery not
visualize, likely occluded. Right PICA not visualized. Basilar
demonstrates scattered atheromatous irregularity without high-grade
stenosis. Superior cerebral arteries patent bilaterally. Both of the
posterior cerebral arteries primarily supplied via the basilar.
Atheromatous irregularity throughout the PCAs bilaterally without
significant stenosis. PCAs are patent to their distal aspects.

No intracranial aneurysm.
IMPRESSION: 1. Occluded right vertebral artery. Otherwise patent vertebrobasilar
system.
2. Moderate atheromatous irregularity throughout the intracranial
circulation without proximal high-grade or correctable stenosis.
